# Patient Record
Sex: Male | Born: 1950 | Race: White | Hispanic: No | Marital: Married | Smoking: Never smoker
Health system: Southern US, Community
[De-identification: ages and names within clinical notes are randomized; demographics above are authoritative.]

## PROBLEM LIST (undated history)

## (undated) DIAGNOSIS — K219 Gastro-esophageal reflux disease without esophagitis: Secondary | ICD-10-CM

## (undated) DIAGNOSIS — K449 Diaphragmatic hernia without obstruction or gangrene: Secondary | ICD-10-CM

## (undated) DIAGNOSIS — T7840XA Allergy, unspecified, initial encounter: Secondary | ICD-10-CM

## (undated) DIAGNOSIS — R42 Dizziness and giddiness: Secondary | ICD-10-CM

## (undated) DIAGNOSIS — R011 Cardiac murmur, unspecified: Secondary | ICD-10-CM

## (undated) DIAGNOSIS — I10 Essential (primary) hypertension: Secondary | ICD-10-CM

## (undated) DIAGNOSIS — M1990 Unspecified osteoarthritis, unspecified site: Secondary | ICD-10-CM

## (undated) DIAGNOSIS — G4730 Sleep apnea, unspecified: Secondary | ICD-10-CM

## (undated) DIAGNOSIS — E785 Hyperlipidemia, unspecified: Secondary | ICD-10-CM

## (undated) HISTORY — DX: Gastro-esophageal reflux disease without esophagitis: K21.9

## (undated) HISTORY — DX: Allergy, unspecified, initial encounter: T78.40XA

## (undated) HISTORY — DX: Unspecified osteoarthritis, unspecified site: M19.90

## (undated) HISTORY — DX: Cardiac murmur, unspecified: R01.1

## (undated) HISTORY — DX: Sleep apnea, unspecified: G47.30

## (undated) HISTORY — DX: Diaphragmatic hernia without obstruction or gangrene: K44.9

## (undated) HISTORY — DX: Hyperlipidemia, unspecified: E78.5

## (undated) HISTORY — DX: Essential (primary) hypertension: I10

## (undated) HISTORY — DX: Dizziness and giddiness: R42

---

## 2001-03-22 HISTORY — PX: CARDIAC CATHETERIZATION: SHX172

## 2001-04-29 HISTORY — PX: LEG SURGERY: SHX1003

## 2001-08-27 ENCOUNTER — Ambulatory Visit (HOSPITAL_COMMUNITY): Admission: RE | Admit: 2001-08-27 | Discharge: 2001-08-27 | Payer: Self-pay | Admitting: Unknown Physician Specialty

## 2001-08-27 ENCOUNTER — Encounter: Payer: Self-pay | Admitting: Unknown Physician Specialty

## 2014-01-21 DIAGNOSIS — R269 Unspecified abnormalities of gait and mobility: Secondary | ICD-10-CM

## 2014-01-21 DIAGNOSIS — H8110 Benign paroxysmal vertigo, unspecified ear: Secondary | ICD-10-CM

## 2014-01-21 DIAGNOSIS — G44309 Post-traumatic headache, unspecified, not intractable: Secondary | ICD-10-CM

## 2014-01-21 HISTORY — DX: Benign paroxysmal vertigo, unspecified ear: H81.10

## 2014-01-21 HISTORY — DX: Unspecified abnormalities of gait and mobility: R26.9

## 2014-01-21 HISTORY — DX: Post-traumatic headache, unspecified, not intractable: G44.309

## 2017-03-14 DIAGNOSIS — R42 Dizziness and giddiness: Secondary | ICD-10-CM

## 2017-03-14 HISTORY — DX: Dizziness and giddiness: R42

## 2017-05-01 DIAGNOSIS — N3020 Other chronic cystitis without hematuria: Secondary | ICD-10-CM | POA: Diagnosis not present

## 2017-05-01 DIAGNOSIS — R338 Other retention of urine: Secondary | ICD-10-CM | POA: Diagnosis not present

## 2017-05-01 DIAGNOSIS — N401 Enlarged prostate with lower urinary tract symptoms: Secondary | ICD-10-CM | POA: Diagnosis not present

## 2017-05-01 DIAGNOSIS — N312 Flaccid neuropathic bladder, not elsewhere classified: Secondary | ICD-10-CM | POA: Diagnosis not present

## 2017-06-01 DIAGNOSIS — N312 Flaccid neuropathic bladder, not elsewhere classified: Secondary | ICD-10-CM | POA: Diagnosis not present

## 2017-06-01 DIAGNOSIS — N3020 Other chronic cystitis without hematuria: Secondary | ICD-10-CM | POA: Diagnosis not present

## 2017-06-01 DIAGNOSIS — N401 Enlarged prostate with lower urinary tract symptoms: Secondary | ICD-10-CM | POA: Diagnosis not present

## 2017-07-13 ENCOUNTER — Encounter: Payer: Self-pay | Admitting: *Deleted

## 2017-07-13 ENCOUNTER — Other Ambulatory Visit: Payer: Self-pay | Admitting: *Deleted

## 2017-07-13 NOTE — Patient Outreach (Signed)
HTA Screen: Pt sees primary care MD routinely Wilburn Mylar at [facility]). Takes medications as ordered for chronic problems. No acute health concerns. No care management needs. Introductory letter sent.  Eulah Pont. Myrtie Neither, MSN, [facility] Gerontological Nurse Practitioner [facility] Care Management [phone]

## 2017-07-27 DIAGNOSIS — G4733 Obstructive sleep apnea (adult) (pediatric): Secondary | ICD-10-CM | POA: Diagnosis not present

## 2017-07-27 DIAGNOSIS — R5383 Other fatigue: Secondary | ICD-10-CM | POA: Diagnosis not present

## 2017-08-28 DIAGNOSIS — E663 Overweight: Secondary | ICD-10-CM | POA: Diagnosis not present

## 2017-08-28 DIAGNOSIS — Z23 Encounter for immunization: Secondary | ICD-10-CM | POA: Diagnosis not present

## 2017-08-28 DIAGNOSIS — Z6835 Body mass index (BMI) 35.0-35.9, adult: Secondary | ICD-10-CM | POA: Diagnosis not present

## 2017-08-28 DIAGNOSIS — I10 Essential (primary) hypertension: Secondary | ICD-10-CM | POA: Diagnosis not present

## 2017-08-28 DIAGNOSIS — Z0000 Encounter for general adult medical examination without abnormal findings: Secondary | ICD-10-CM | POA: Diagnosis not present

## 2017-08-28 DIAGNOSIS — Z1339 Encounter for screening examination for other mental health and behavioral disorders: Secondary | ICD-10-CM | POA: Diagnosis not present

## 2017-08-28 DIAGNOSIS — Z1331 Encounter for screening for depression: Secondary | ICD-10-CM | POA: Diagnosis not present

## 2017-08-28 DIAGNOSIS — E119 Type 2 diabetes mellitus without complications: Secondary | ICD-10-CM | POA: Diagnosis not present

## 2017-08-28 DIAGNOSIS — G4730 Sleep apnea, unspecified: Secondary | ICD-10-CM | POA: Diagnosis not present

## 2017-08-28 DIAGNOSIS — Z9181 History of falling: Secondary | ICD-10-CM | POA: Diagnosis not present

## 2017-08-28 DIAGNOSIS — Z79899 Other long term (current) drug therapy: Secondary | ICD-10-CM | POA: Diagnosis not present

## 2017-08-28 DIAGNOSIS — E785 Hyperlipidemia, unspecified: Secondary | ICD-10-CM | POA: Diagnosis not present

## 2017-08-31 DIAGNOSIS — H35369 Drusen (degenerative) of macula, unspecified eye: Secondary | ICD-10-CM | POA: Diagnosis not present

## 2017-08-31 DIAGNOSIS — Z961 Presence of intraocular lens: Secondary | ICD-10-CM | POA: Diagnosis not present

## 2017-08-31 DIAGNOSIS — H11153 Pinguecula, bilateral: Secondary | ICD-10-CM | POA: Diagnosis not present

## 2017-09-03 DIAGNOSIS — G4733 Obstructive sleep apnea (adult) (pediatric): Secondary | ICD-10-CM | POA: Diagnosis not present

## 2017-09-04 DIAGNOSIS — N3020 Other chronic cystitis without hematuria: Secondary | ICD-10-CM | POA: Diagnosis not present

## 2017-09-04 DIAGNOSIS — N312 Flaccid neuropathic bladder, not elsewhere classified: Secondary | ICD-10-CM | POA: Diagnosis not present

## 2017-09-04 DIAGNOSIS — N401 Enlarged prostate with lower urinary tract symptoms: Secondary | ICD-10-CM | POA: Diagnosis not present

## 2017-09-13 DIAGNOSIS — J321 Chronic frontal sinusitis: Secondary | ICD-10-CM | POA: Diagnosis not present

## 2017-09-13 DIAGNOSIS — Z6836 Body mass index (BMI) 36.0-36.9, adult: Secondary | ICD-10-CM | POA: Diagnosis not present

## 2017-09-19 DIAGNOSIS — R5383 Other fatigue: Secondary | ICD-10-CM | POA: Diagnosis not present

## 2017-09-19 DIAGNOSIS — G4733 Obstructive sleep apnea (adult) (pediatric): Secondary | ICD-10-CM | POA: Diagnosis not present

## 2017-10-10 DIAGNOSIS — G4733 Obstructive sleep apnea (adult) (pediatric): Secondary | ICD-10-CM | POA: Diagnosis not present

## 2017-10-16 DIAGNOSIS — Z6837 Body mass index (BMI) 37.0-37.9, adult: Secondary | ICD-10-CM | POA: Diagnosis not present

## 2017-10-16 DIAGNOSIS — J40 Bronchitis, not specified as acute or chronic: Secondary | ICD-10-CM | POA: Diagnosis not present

## 2017-11-09 DIAGNOSIS — G4733 Obstructive sleep apnea (adult) (pediatric): Secondary | ICD-10-CM | POA: Diagnosis not present

## 2017-11-10 DIAGNOSIS — G4733 Obstructive sleep apnea (adult) (pediatric): Secondary | ICD-10-CM | POA: Diagnosis not present

## 2017-11-28 DIAGNOSIS — R05 Cough: Secondary | ICD-10-CM | POA: Diagnosis not present

## 2017-11-28 DIAGNOSIS — G4733 Obstructive sleep apnea (adult) (pediatric): Secondary | ICD-10-CM | POA: Diagnosis not present

## 2017-11-28 DIAGNOSIS — R5383 Other fatigue: Secondary | ICD-10-CM | POA: Diagnosis not present

## 2017-12-11 DIAGNOSIS — G4733 Obstructive sleep apnea (adult) (pediatric): Secondary | ICD-10-CM | POA: Diagnosis not present

## 2018-01-02 DIAGNOSIS — T17208A Unspecified foreign body in pharynx causing other injury, initial encounter: Secondary | ICD-10-CM | POA: Diagnosis not present

## 2018-01-07 DIAGNOSIS — R590 Localized enlarged lymph nodes: Secondary | ICD-10-CM | POA: Diagnosis not present

## 2018-01-07 DIAGNOSIS — J479 Bronchiectasis, uncomplicated: Secondary | ICD-10-CM | POA: Diagnosis not present

## 2018-01-07 DIAGNOSIS — J849 Interstitial pulmonary disease, unspecified: Secondary | ICD-10-CM | POA: Diagnosis not present

## 2018-01-10 DIAGNOSIS — R05 Cough: Secondary | ICD-10-CM | POA: Diagnosis not present

## 2018-01-10 DIAGNOSIS — J84112 Idiopathic pulmonary fibrosis: Secondary | ICD-10-CM | POA: Diagnosis not present

## 2018-01-10 DIAGNOSIS — R591 Generalized enlarged lymph nodes: Secondary | ICD-10-CM | POA: Diagnosis not present

## 2018-01-10 DIAGNOSIS — M539 Dorsopathy, unspecified: Secondary | ICD-10-CM | POA: Diagnosis not present

## 2018-01-10 DIAGNOSIS — T17208A Unspecified foreign body in pharynx causing other injury, initial encounter: Secondary | ICD-10-CM | POA: Diagnosis not present

## 2018-01-10 DIAGNOSIS — G4733 Obstructive sleep apnea (adult) (pediatric): Secondary | ICD-10-CM | POA: Diagnosis not present

## 2018-01-10 DIAGNOSIS — I7090 Unspecified atherosclerosis: Secondary | ICD-10-CM | POA: Diagnosis not present

## 2018-01-10 DIAGNOSIS — J849 Interstitial pulmonary disease, unspecified: Secondary | ICD-10-CM | POA: Diagnosis not present

## 2018-01-21 DIAGNOSIS — J8410 Pulmonary fibrosis, unspecified: Secondary | ICD-10-CM | POA: Diagnosis not present

## 2018-01-21 DIAGNOSIS — I700 Atherosclerosis of aorta: Secondary | ICD-10-CM | POA: Diagnosis not present

## 2018-01-21 DIAGNOSIS — J84112 Idiopathic pulmonary fibrosis: Secondary | ICD-10-CM | POA: Diagnosis not present

## 2018-01-25 DIAGNOSIS — R05 Cough: Secondary | ICD-10-CM | POA: Diagnosis not present

## 2018-01-25 DIAGNOSIS — J84112 Idiopathic pulmonary fibrosis: Secondary | ICD-10-CM | POA: Diagnosis not present

## 2018-01-25 DIAGNOSIS — J479 Bronchiectasis, uncomplicated: Secondary | ICD-10-CM | POA: Diagnosis not present

## 2018-01-25 DIAGNOSIS — G4733 Obstructive sleep apnea (adult) (pediatric): Secondary | ICD-10-CM | POA: Diagnosis not present

## 2018-02-01 DIAGNOSIS — R5383 Other fatigue: Secondary | ICD-10-CM | POA: Diagnosis not present

## 2018-02-01 DIAGNOSIS — J84112 Idiopathic pulmonary fibrosis: Secondary | ICD-10-CM | POA: Diagnosis not present

## 2018-02-01 DIAGNOSIS — R05 Cough: Secondary | ICD-10-CM | POA: Diagnosis not present

## 2018-02-01 DIAGNOSIS — G4733 Obstructive sleep apnea (adult) (pediatric): Secondary | ICD-10-CM | POA: Diagnosis not present

## 2018-02-04 DIAGNOSIS — E7800 Pure hypercholesterolemia, unspecified: Secondary | ICD-10-CM | POA: Diagnosis not present

## 2018-02-04 DIAGNOSIS — I10 Essential (primary) hypertension: Secondary | ICD-10-CM | POA: Diagnosis not present

## 2018-02-04 DIAGNOSIS — I2510 Atherosclerotic heart disease of native coronary artery without angina pectoris: Secondary | ICD-10-CM | POA: Diagnosis not present

## 2018-02-04 DIAGNOSIS — Z6836 Body mass index (BMI) 36.0-36.9, adult: Secondary | ICD-10-CM | POA: Diagnosis not present

## 2018-02-04 DIAGNOSIS — J849 Interstitial pulmonary disease, unspecified: Secondary | ICD-10-CM | POA: Diagnosis not present

## 2018-02-04 DIAGNOSIS — E119 Type 2 diabetes mellitus without complications: Secondary | ICD-10-CM | POA: Diagnosis not present

## 2018-02-05 ENCOUNTER — Encounter: Payer: Self-pay | Admitting: Cardiology

## 2018-02-05 DIAGNOSIS — I10 Essential (primary) hypertension: Secondary | ICD-10-CM | POA: Insufficient documentation

## 2018-02-05 DIAGNOSIS — G4730 Sleep apnea, unspecified: Secondary | ICD-10-CM | POA: Insufficient documentation

## 2018-02-05 DIAGNOSIS — E7800 Pure hypercholesterolemia, unspecified: Secondary | ICD-10-CM | POA: Insufficient documentation

## 2018-02-05 DIAGNOSIS — E669 Obesity, unspecified: Secondary | ICD-10-CM | POA: Insufficient documentation

## 2018-02-05 HISTORY — DX: Pure hypercholesterolemia, unspecified: E78.00

## 2018-02-05 HISTORY — DX: Obesity, unspecified: E66.9

## 2018-02-06 ENCOUNTER — Encounter: Payer: Self-pay | Admitting: Cardiology

## 2018-02-06 ENCOUNTER — Ambulatory Visit (INDEPENDENT_AMBULATORY_CARE_PROVIDER_SITE_OTHER): Payer: PPO | Admitting: Cardiology

## 2018-02-06 VITALS — BP 140/78 | HR 57 | Ht 66.0 in | Wt 239.1 lb

## 2018-02-06 DIAGNOSIS — E7800 Pure hypercholesterolemia, unspecified: Secondary | ICD-10-CM | POA: Diagnosis not present

## 2018-02-06 DIAGNOSIS — E669 Obesity, unspecified: Secondary | ICD-10-CM

## 2018-02-06 DIAGNOSIS — I10 Essential (primary) hypertension: Secondary | ICD-10-CM

## 2018-02-06 DIAGNOSIS — I7090 Unspecified atherosclerosis: Secondary | ICD-10-CM

## 2018-02-06 DIAGNOSIS — G4730 Sleep apnea, unspecified: Secondary | ICD-10-CM

## 2018-02-06 HISTORY — DX: Unspecified atherosclerosis: I70.90

## 2018-02-06 NOTE — Patient Instructions (Signed)
Medication Instructions:  Your physician recommends that you continue on your current medications as directed. Please refer to the Current Medication list given to you today.  Labwork: None  Testing/Procedures: Your physician has requested that you have a lexiscan myoview. For further information please visit [email or web address]. Please follow instruction sheet, as given.   Follow-Up: Your physician recommends that you schedule a follow-up appointment in: 6 months  Any Other Special Instructions Will Be Listed Below (If Applicable).     If you need a refill on your cardiac medications before your next appointment, please call your pharmacy.   [place], RN, BSN

## 2018-02-06 NOTE — Progress Notes (Signed)
Cardiology Office Note:    Date:  02/06/2018   ID:  Stephen [name], DOB October 28, 1951, MRN [id number]  PCP:  Ronita Hipps, MD  Cardiologist:  Jenean Lindau, MD   Referring MD: Ronita Hipps, MD    ASSESSMENT:    1. ASVD (arteriosclerotic vascular disease)   2. Hypertension, unspecified type   3. Hypercholesteremia   4. Obesity, unspecified classification, unspecified obesity type, unspecified whether serious comorbidity present   5. Sleep apnea, unspecified type    PLAN:    In order of problems listed above:  1. Secondary prevention stressed with the patient.  Importance of compliance with diet and medications stressed and he vocalized understanding.  Blood pressure stable.  Diet was discussed with dyslipidemia.  He tells me that he has diet-controlled diabetes mellitus and he is doing well to keep it under control.  Weight reduction was stressed.  Risks of obesity explained and he vocalized understanding. 2. I advised the patient about considering a different statin therapy such as atorvastatin or rosuvastatin as these are more of the recommended statins at this time in patients with significant risks and existing coronary artery disease and he tells me that he wants to think about it. 3. Lexiscan sestamibi will be done to assess his coronary artery disease to see if there is any obstructive coronary artery disease.  The patient is leads a very sedentary lifestyle because of orthopedic issues. 4. Patient will be seen in follow-up appointment in 6 months or earlier if the patient has any concerns 5. Also I will try to see if any medication adjustment that needs to be done for the hyponatremia.  We will try to get in touch with the pharmacy for that evaluation.  He has chronic hyponatremia and this is followed by his primary care physician.   Medication Adjustments/Labs and Tests Ordered: Current medicines are reviewed at length with the patient today.  Concerns regarding medicines are  outlined above.  Orders Placed This Encounter  Procedures  . MYOCARDIAL PERFUSION IMAGING   No orders of the defined types were placed in this encounter.    Chief Complaint  Patient presents with  . Referral    Dr. Helene Kelp  . Coronary Artery Disease     History of Present Illness:    Stephen [name] is a 67 y.o. male the patient has history of essential hypertension and dyslipidemia.  He is overweight.  She has been diagnosed to have hyponatremia.  The patient underwent CT scan and was found to have atherosclerotic vascular disease.  Calcifications were performed in his coronary circulation and therefore he was sent here for an evaluation.  The patient has orthopedic issues and ambulates with a cane.  He denies any chest pain orthopnea or PND.  He has been on statin therapy and on aspirin appropriately for a long period of time.  At the time of my evaluation, the patient is alert awake oriented and in no distress.  His wife accompanies him for this visit.  Past Medical History:  Diagnosis Date  . Allergy   . Arthritis   . GERD (gastroesophageal reflux disease)   . Heart murmur   . Hiatal hernia   . Hyperlipidemia   . Hypertension   . Sleep apnea   . Vertigo     Past Surgical History:  Procedure Laterality Date  . CARDIAC CATHETERIZATION  03/22/2001  . LEG SURGERY Bilateral 04/2001    Current Medications: Current Meds  Medication Sig  .  acetylcysteine (MUCOMYST) 10 % nebulizer solution   . amitriptyline (ELAVIL) 75 MG tablet Take 75 mg by mouth at bedtime.  Marland Kitchen amLODipine (NORVASC) 10 MG tablet Take 10 mg by mouth daily.  Marland Kitchen aspirin EC 81 MG tablet Take 81 mg by mouth daily.  Marland Kitchen azithromycin (ZITHROMAX) 500 MG tablet Take one tablet by mouth daily for 10 days  . benazepril (LOTENSIN) 40 MG tablet Take 40 mg by mouth daily.  . calcium carbonate (OSCAL) 1500 (600 Ca) MG TABS tablet Take by mouth 2 (two) times daily with a meal.  . calcium carbonate (TUMS - DOSED IN MG ELEMENTAL  CALCIUM) 500 MG chewable tablet Chew 1 tablet by mouth daily.  . carvedilol (COREG) 25 MG tablet Take 25 mg by mouth 2 (two) times daily with a meal.  . cholecalciferol (VITAMIN D) 1000 units tablet Take 1,000 Units by mouth daily.  . cloNIDine (CATAPRES) 0.3 MG tablet Take 0.3 mg by mouth 2 (two) times daily.  Marland Kitchen dicyclomine (BENTYL) 10 MG capsule Take 10 mg by mouth 4 (four) times daily -  before meals and at bedtime.  . fenofibrate micronized (LOFIBRA) 134 MG capsule Take 134 mg by mouth every other day.  . fluticasone (FLONASE) 50 MCG/ACT nasal spray instill 1 spray in each nostril TWICE DAILY  . ipratropium-albuterol (DUONEB) 0.5-2.5 (3) MG/3ML SOLN Inhale 3 mLs into the lungs as needed.  . lansoprazole (PREVACID) 30 MG capsule Take 30 mg by mouth daily at 12 noon.  Marland Kitchen levofloxacin (LEVAQUIN) 500 MG tablet Take 500 mg by mouth daily.  Marland Kitchen lovastatin (MEVACOR) 20 MG tablet Take 20 mg by mouth every other day.  . montelukast (SINGULAIR) 10 MG tablet Take 10 mg by mouth at bedtime.  . Multiple Vitamin (MULTIVITAMIN WITH MINERALS) TABS tablet Take 1 tablet by mouth daily.  . nabumetone (RELAFEN) 750 MG tablet Take 750 mg by mouth 2 (two) times daily.  . polyethylene glycol (MIRALAX / GLYCOLAX) packet Take 17 g by mouth daily.  Marland Kitchen spironolactone (ALDACTONE) 25 MG tablet Take 25 mg by mouth 3 (three) times a week.  . vitamin E 400 UNIT capsule Take 400 Units by mouth daily.     Allergies:   Ciprofloxacin and Codeine   Social History   Socioeconomic History  . Marital status: Married    Spouse name: Not on file  . Number of children: Not on file  . Years of education: Not on file  . Highest education level: Not on file  Occupational History  . Not on file  Social Needs  . Financial resource strain: Not on file  . Food insecurity:    Worry: Not on file    Inability: Not on file  . Transportation needs:    Medical: Not on file    Non-medical: Not on file  Tobacco Use  . Smoking status:  Never Smoker  . Smokeless tobacco: Never Used  Substance and Sexual Activity  . Alcohol use: Not on file  . Drug use: Not on file  . Sexual activity: Not on file  Lifestyle  . Physical activity:    Days per week: Not on file    Minutes per session: Not on file  . Stress: Not on file  Relationships  . Social connections:    Talks on phone: Not on file    Gets together: Not on file    Attends religious service: Not on file    Active member of club or organization: Not on file  Attends meetings of clubs or organizations: Not on file    Relationship status: Not on file  Other Topics Concern  . Not on file  Social History Narrative  . Not on file     Family History: The patient's family history includes Aneurysm in his father and maternal grandmother; Cancer in his maternal grandmother; Dementia in his mother; Heart attack in his father.  ROS:   Please see the history of present illness.    All other systems reviewed and are negative.  EKGs/Labs/Other Studies Reviewed:    The following studies were reviewed today: EKG reveals sinus rhythm and nonspecific ST-T changes.   Recent Labs: No results found for requested labs within last 8760 hours.  Recent Lipid Panel No results found for: CHOL, TRIG, HDL, CHOLHDL, VLDL, LDLCALC, LDLDIRECT  Physical Exam:    VS:  BP 140/78 (BP Location: Right Arm, Patient Position: Sitting, Cuff Size: Normal)   Pulse (!) 57   Ht _0  (1.676 m)   Wt 239 lb 1.9 oz (108.5 kg)   SpO2 98%   BMI 38.59 kg/m     Wt Readings from Last 3 Encounters:  02/06/18 239 lb 1.9 oz (108.5 kg)     GEN: Patient is in no acute distress HEENT: Normal NECK: No JVD; No carotid bruits LYMPHATICS: No lymphadenopathy CARDIAC: Hear sounds regular, 2/6 systolic murmur at the apex. RESPIRATORY:  Clear to auscultation without rales, wheezing or rhonchi  ABDOMEN: Soft, non-tender, non-distended MUSCULOSKELETAL:  No edema; No deformity  SKIN: Warm and  dry NEUROLOGIC:  Alert and oriented x 3 PSYCHIATRIC:  Normal affect   Signed, Jenean Lindau, MD  02/06/2018 11:46 AM    Stephen [name]

## 2018-02-06 NOTE — Progress Notes (Deleted)
ek

## 2018-02-07 ENCOUNTER — Other Ambulatory Visit: Payer: Self-pay

## 2018-02-07 DIAGNOSIS — E871 Hypo-osmolality and hyponatremia: Secondary | ICD-10-CM

## 2018-02-07 MED ORDER — LISINOPRIL 20 MG PO TABS: 20.0000 mg | ORAL_TABLET | Freq: Every day | ORAL | 3 refills | Status: DC

## 2018-02-07 NOTE — Telephone Encounter (Signed)
Benazepril was discontinued as it may be causing the patients chronic hyponatremia; lisinopril 20 mg was added per Dr. Geraldo Pitter. Patient was educated to keep his dietary potassium intake limited as the spironolactone and lisinopril could cause hypokalemia. The patient will have a BMP completed in one week at [facility] in [place]. Wife and patient were agreeable to the plan.

## 2018-02-08 DIAGNOSIS — G4733 Obstructive sleep apnea (adult) (pediatric): Secondary | ICD-10-CM | POA: Diagnosis not present

## 2018-02-15 ENCOUNTER — Encounter: Payer: Self-pay | Admitting: Cardiology

## 2018-02-15 DIAGNOSIS — I10 Essential (primary) hypertension: Secondary | ICD-10-CM | POA: Diagnosis not present

## 2018-02-15 LAB — BASIC METABOLIC PANEL
BUN/Creatinine Ratio: 10 (ref 10–24)
BUN: 9 mg/dL (ref 8–27)
CO2: 24 mmol/L (ref 20–29)
Calcium: 9.7 mg/dL (ref 8.6–10.2)
Chloride: 94 mmol/L — ABNORMAL LOW (ref 96–106)
Creatinine, Ser: 0.9 mg/dL (ref 0.76–1.27)
GFR calc Af Amer: 103 mL/min/{1.73_m2} (ref >59)
GFR calc non Af Amer: 89 mL/min/{1.73_m2} (ref >59)
Glucose: 112 mg/dL — ABNORMAL HIGH (ref 65–99)
Potassium: 4.5 mmol/L (ref 3.5–5.2)
Sodium: 128 mmol/L — ABNORMAL LOW (ref 134–144)

## 2018-02-15 NOTE — Addendum Note (Signed)
Addended by: Tarri Glenn on: 02/15/2018 11:27 AM   Modules accepted: Orders

## 2018-02-18 ENCOUNTER — Telehealth: Payer: Self-pay

## 2018-02-18 ENCOUNTER — Other Ambulatory Visit: Payer: Self-pay

## 2018-02-18 DIAGNOSIS — I10 Essential (primary) hypertension: Secondary | ICD-10-CM

## 2018-02-18 NOTE — Telephone Encounter (Signed)
Left voicemail for the patien to call the office to discuss labwork. PCP reported past sodium of 125.

## 2018-02-18 NOTE — Telephone Encounter (Signed)
Left voicemail to discuss lab results.

## 2018-02-18 NOTE — Telephone Encounter (Signed)
-----  Message from Jenean Lindau, MD sent at 02/18/2018  8:27 AM EDT ----- The results of the study is unremarkable. Please inform patient.  Get prior BMP from physician office.  I will discuss in detail at next appointment. Cc  primary care/referring physician Jenean Lindau, MD 02/18/2018 8:27 AM

## 2018-02-21 ENCOUNTER — Telehealth (HOSPITAL_COMMUNITY): Payer: Self-pay | Admitting: *Deleted

## 2018-02-21 NOTE — Telephone Encounter (Signed)
Patient given detailed instructions per Myocardial Perfusion Study Information Sheet for the test on 02/25/18. Patient notified to arrive 15 minutes early and that it is imperative to arrive on time for appointment to keep from having the test rescheduled.  If you need to cancel or reschedule your appointment, please call the office within 24 hours of your appointment. . Patient verbalized understanding. Kirstie Peri

## 2018-02-25 ENCOUNTER — Ambulatory Visit (HOSPITAL_COMMUNITY): Payer: PPO | Attending: Cardiology

## 2018-02-25 ENCOUNTER — Ambulatory Visit (HOSPITAL_COMMUNITY): Payer: PPO

## 2018-02-25 VITALS — Ht 66.0 in | Wt 239.0 lb

## 2018-02-25 DIAGNOSIS — I7090 Unspecified atherosclerosis: Secondary | ICD-10-CM | POA: Diagnosis not present

## 2018-02-25 DIAGNOSIS — I10 Essential (primary) hypertension: Secondary | ICD-10-CM

## 2018-02-25 LAB — MYOCARDIAL PERFUSION IMAGING
LV dias vol: 130 mL (ref 62–150)
LV sys vol: 42 mL
Peak HR: 74 {beats}/min
RATE: 0.43
Rest HR: 60 {beats}/min
SDS: 3
SRS: 0
SSS: 3
TID: 1.05

## 2018-02-25 MED ORDER — TECHNETIUM TC 99M TETROFOSMIN IV KIT
10.6000 | PACK | Freq: Once | INTRAVENOUS | Status: AC | PRN
  Administered 2018-02-25: 10.6 via INTRAVENOUS
  Filled 2018-02-25: qty 11

## 2018-02-25 MED ORDER — REGADENOSON 0.4 MG/5ML IV SOLN
0.4000 mg | Freq: Once | INTRAVENOUS | Status: AC
  Administered 2018-02-25: 0.4 mg via INTRAVENOUS

## 2018-02-25 MED ORDER — TECHNETIUM TC 99M TETROFOSMIN IV KIT
31.8000 | PACK | Freq: Once | INTRAVENOUS | Status: AC | PRN
  Administered 2018-02-25: 31.8 via INTRAVENOUS
  Filled 2018-02-25: qty 32

## 2018-02-26 ENCOUNTER — Ambulatory Visit (HOSPITAL_COMMUNITY): Payer: PPO

## 2018-03-01 DIAGNOSIS — I10 Essential (primary) hypertension: Secondary | ICD-10-CM | POA: Diagnosis not present

## 2018-03-02 LAB — BASIC METABOLIC PANEL
BUN / CREAT RATIO: 11 (ref 10–24)
BUN: 9 mg/dL (ref 8–27)
CO2: 24 mmol/L (ref 20–29)
Calcium: 9.5 mg/dL (ref 8.6–10.2)
Chloride: 94 mmol/L — ABNORMAL LOW (ref 96–106)
Creatinine, Ser: 0.8 mg/dL (ref 0.76–1.27)
GFR calc non Af Amer: 93 mL/min/{1.73_m2} (ref >59)
GFR, EST AFRICAN AMERICAN: 108 mL/min/{1.73_m2} (ref >59)
Glucose: 93 mg/dL (ref 65–99)
Potassium: 4.6 mmol/L (ref 3.5–5.2)
Sodium: 130 mmol/L — ABNORMAL LOW (ref 134–144)

## 2018-03-05 ENCOUNTER — Encounter: Payer: Self-pay | Admitting: Internal Medicine

## 2018-03-05 ENCOUNTER — Ambulatory Visit: Payer: PPO | Admitting: Internal Medicine

## 2018-03-05 ENCOUNTER — Other Ambulatory Visit (INDEPENDENT_AMBULATORY_CARE_PROVIDER_SITE_OTHER): Payer: PPO

## 2018-03-05 VITALS — BP 118/70 | HR 84 | Ht 66.0 in | Wt 238.4 lb

## 2018-03-05 DIAGNOSIS — J849 Interstitial pulmonary disease, unspecified: Secondary | ICD-10-CM | POA: Diagnosis not present

## 2018-03-05 LAB — SEDIMENTATION RATE: Sed Rate: 21 mm/hr — ABNORMAL HIGH (ref 0–20)

## 2018-03-05 NOTE — Patient Instructions (Signed)
ICD-10-CM   1. ILD (interstitial lung disease) ([place]) J84.9      - You  have Interstitial Lung Disease (ILD)  -  There are > 100 varieties of this - To narrow down possibilities and assess severity please do the following tests  - do spirometry and DLCO (choose a location depending on schedule and convenience)   - if not get reslts from Dr Alcide Clever office  - - do autoimmune panel: Serum: ESR, ANA, DS-DNA, RF, anti-CCP, ssA, ssB, scl-70, ANCA, MPO and PR-3 antibodies, Total CK,  Aldolase,  Hypersensitivity Pneumonitis Panel - Fill our ILD questionnaire 03/05/2018 - will get a 2nd opinion on  Your CT chest  Followup 2-4 weeks ILD clinic to regroup  - depending on results biiopsy discussion

## 2018-03-05 NOTE — Progress Notes (Signed)
Subjective:    Patient ID: Stephen [name], male    DOB: 1950/12/20, 67 y.o.   MRN: [id number]  PCP Ronita Hipps, MD  HPI   IOV 03/05/2018  Chief Complaint  Patient presents with  . Consult    Referral per MD Helene Kelp, Dec 2018 had sinus infection and still has cough, hx of mild bronchiectasis, later was given symbicort which patient believes is making him more SOB.      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  He lives in [facility] and presents with his wife for evaluation of pulmonary fibrosis to the ILD clinic.  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  During this time he was not exposed to any metal dust but he was exposed to mold and damp environments intermittently but not always.  He denies any mold or mildew in the house.  He tells me that in December 2018 he had a sinus infection and since then has a lingering cough that never really went away.  It looks like from the primary care physician he saw dentist and from there he had imaging and this finally all resulted in a high-resolution CT chest January 21, 2018 that I personally visualized.  The official report is that it is indeterminate for UIP.  In my personal visualization there is bilateral bibasal symmetric disease with definite of craniocaudal gradient.  The septal thickening and also cylindrical bronchiectasis and peripheral bronchiec bronchiolectasis.  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  He does not have much of her shortness of breath but then he is morbidly obese and has balance issues and is quite limited.  He was seen by a local pulmonologist will give him a 2-5-year survival rate and therefore he is frustrated.  He was started on Symbicort which actually made things worse for him.  [place] pulmonary interstitial lung disease  questionnaire  Symptoms: Since dec 2019. Some sputjm +. Better since started. Insidious dyspnea x 4 months. Same since onset. No episodic dyspnea. lLeve 4 dyspnea walking wiuth peers and level 5 walking up hill/stairs. Level 1 for dressing, Level 2 fo rshopping and mpwing lawn  ROS: chronic balance issues affecting mobiluity.  In association with this he has fatigue for the last 3 years.  He has dry mouth for the last several years associated with some dysphagia for the last 10 years heartburn and acid reflux for several years.  Daytime set sleepiness for the last several years but denies any oral ulcers or rash or weight loss or recurrent fever or arthralgia  Past medical history: Denies any asthma COPD or heart failure rheumatoid arthritis or scleroderma or systemic lupus erythematosus or polymyositis or Sjogren's but is positive for sleep apnea and hiatal hernia.  Negative for pulmonary hypertension diabetes thyroid disease stroke seizures hepatitis tuberculosis kidney disease blood clots heart disease other than murmur pleurisy  Family history of pulmonary disease: Positive for asthma in the mother but nobody with pulmonary fibrosis  Personal exposure history: He has never smoked cigarettes did smoke cigars for 3 years.  [place]  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  During this time there is intermittent mold exposure  Pulmonary toxicity history denies a laundry list of pulmonary toxicity medications   Simple office walk 185 feet x  3 laps goal with forehead probe 03/05/2018   O2 used Room air  Number laps completed All 3 laops  Comments about pace Walks very slow due to baseline balance issue "40% of  balance only due to remote trauma  Resting Pulse Ox/HR 100% and 55/min  Final Pulse Ox/HR 97% and 78/min  Desaturated </= 88% no  Desaturated <= 3% points yes  Got Tachycardic >/= 90/min no  Symptoms at end of test No, denied dyspnea  Miscellaneous comments npne        has a past medical history of Allergy, Arthritis, GERD (gastroesophageal reflux disease), Heart murmur, Hiatal hernia, Hyperlipidemia, Hypertension, Sleep apnea, and Vertigo.   reports that he has never smoked. He has never used smokeless tobacco.  Past Surgical History:  Procedure Laterality Date  . CARDIAC CATHETERIZATION  03/22/2001  . LEG SURGERY Bilateral 04/2001    Allergies  Allergen Reactions  . Ciprofloxacin Other (See Comments)    colitis  . Codeine     Immunization History  Administered Date(s) Administered  . Influenza, High Dose Seasonal PF 08/28/2017  . Pneumococcal Conjugate-13 08/25/2016  . Pneumococcal Polysaccharide-23 11/18/2007    Family History  Problem Relation Age of Onset  . Dementia Mother   . Heart attack Father   . Aneurysm Father   . Cancer Maternal Grandmother   . Aneurysm Maternal Grandmother      Current Outpatient Medications:  .  amitriptyline (ELAVIL) 75 MG tablet, Take 75 mg by mouth at bedtime., Disp: , Rfl:  .  amLODipine (NORVASC) 10 MG tablet, Take 10 mg by mouth daily., Disp: , Rfl:  .  aspirin EC 81 MG tablet, Take 81 mg by mouth daily., Disp: , Rfl:  .  calcium carbonate (OSCAL) 1500 (600 Ca) MG TABS tablet, Take by mouth 2 (two) times daily with a meal., Disp: , Rfl:  .  calcium carbonate (TUMS - DOSED IN MG ELEMENTAL CALCIUM) 500 MG chewable tablet, Chew 1 tablet by mouth daily., Disp: , Rfl:  .  carvedilol (COREG) 25 MG tablet, Take 25 mg by mouth 2 (two) times daily with a meal., Disp: , Rfl:  .  cholecalciferol (VITAMIN D) 1000 units tablet, Take 1,000 Units by mouth daily., Disp: , Rfl:  .  cloNIDine (CATAPRES) 0.3 MG tablet, Take 0.3 mg by mouth 2  (two) times daily., Disp: , Rfl:  .  dicyclomine (BENTYL) 10 MG capsule, Take 10 mg by mouth 4 (four) times daily -  before meals and at bedtime., Disp: , Rfl:  .  fenofibrate micronized (LOFIBRA) 134 MG capsule, Take 134 mg by mouth every other day., Disp: , Rfl:  .  lansoprazole (PREVACID) 30 MG capsule, Take 30 mg by mouth daily at 12 noon., Disp: , Rfl:  .  lisinopril (PRINIVIL,ZESTRIL) 20 MG tablet, Take 1 tablet (20 mg total) by mouth daily., Disp: 30 tablet, Rfl: 3 .  lovastatin (MEVACOR) 20 MG tablet, Take 20 mg by mouth every other day., Disp: , Rfl:  .  montelukast (SINGULAIR) 10 MG tablet, Take 10 mg by mouth at bedtime., Disp: , Rfl:  .  Multiple Vitamin (MULTIVITAMIN WITH MINERALS) TABS tablet, Take 1 tablet by mouth daily., Disp: , Rfl:  .  nabumetone (RELAFEN) 750 MG tablet, Take 750 mg by mouth 2 (two) times daily., Disp: , Rfl:  .  polyethylene glycol (MIRALAX / GLYCOLAX) packet, Take 17 g by mouth daily., Disp: , Rfl:  .  spironolactone (ALDACTONE) 25 MG tablet, Take 25 mg by mouth 3 (three) times a week., Disp: , Rfl:  .  vitamin E 400 UNIT capsule, Take 400 Units by mouth daily., Disp: , Rfl:  .  acetylcysteine (MUCOMYST) 10 % nebulizer solution, , Disp: , Rfl:  .  azithromycin (ZITHROMAX) 500 MG tablet, Take one tablet by mouth daily for 10 days, Disp: , Rfl: 0 .  fluticasone (FLONASE) 50 MCG/ACT nasal spray, instill 1 spray in each nostril TWICE DAILY, Disp: , Rfl: 3 .  ipratropium-albuterol (DUONEB) 0.5-2.5 (3) MG/3ML SOLN, Inhale 3 mLs into the lungs as needed., Disp: , Rfl:  .  levofloxacin (LEVAQUIN) 500 MG tablet, Take 500 mg by mouth daily., Disp: , Rfl:     Review of Systems  Constitutional: Negative for fever and unexpected weight change.  HENT: Positive for sinus pressure. Negative for congestion, dental problem, ear pain, nosebleeds, postnasal drip, rhinorrhea, sneezing, sore throat and trouble swallowing.   Eyes: Negative for redness and itching.  Respiratory:  Positive for shortness of breath. Negative for cough, chest tightness and wheezing.   Cardiovascular: Negative for palpitations and leg swelling.  Gastrointestinal: Negative for nausea and vomiting.  Genitourinary: Negative for dysuria.  Musculoskeletal: Negative for joint swelling.  Skin: Negative for rash.  Allergic/Immunologic: Positive for environmental allergies. Negative for food allergies and immunocompromised state.  Neurological: Negative for headaches.  Hematological: Does not bruise/bleed easily.  Psychiatric/Behavioral: Negative for dysphoric mood. The patient is not nervous/anxious.        Objective:   Physical Exam  Vitals:   03/05/18 1349  BP: 118/70  Pulse: 84  SpO2: 95%  Weight: 238 lb 6.4 oz (108.1 kg)  Height: _0  (1.676 m)    Estimated body mass index is 38.48 kg/m as calculated from the following:   Height as of this encounter: _1  (1.676 m).   Weight as of this encounter: 238 lb 6.4 oz (108.1 kg).   General Appearance:    LooksOBESE - +  Head:    Normocephalic, without obvious abnormality, atraumatic  Eyes:    PERRL - yes, conjunctiva/corneas - clear      Ears:    Normal external ear canals, both ears  Nose:   NG tube - no  Throat:  ETT TUBE - no , OG tube - no  Neck:   Supple,  No enlargement/tenderness/nodules     Lungs:     Bilateral LL velcro crackles +  Chest wall:    No deformity  Heart:    S1 and S2 normal, no murmur, CVP - no.  Pressors - no  Abdomen:     Soft, no masses, no organomegaly  Genitalia:    Not done  Rectal:   not done  Extremities:   Extremities- no clubbing     Skin:   Intact in exposed areas .      Neurologic:   Sedation - none -> RASS - na . Moves all 4s - yes. CAM-ICU - neg . Orientation - x3+          Assessment & Plan:     ICD-10-CM   1. ILD (interstitial lung disease) ([place]) J84.9    The CT scan is a the probable UIP or indeterminate UIP.  Given male  gender and age greater than 33 the likelihood of IPF  is high provided the autoimmune test results are negative.  Alternatively given the history of climbing and working in [employer] and crawl spaces there is a possibility for chronic hypersensitivity pneumonitis the second most common differential diagnosis in the situation.  Plan     - You  have Interstitial Lung Disease (ILD)  -  There are > 100 varieties of this - To narrow down possibilities and assess severity please do the following tests  - do spirometry and DLCO (choose a location depending on schedule and convenience)   - if not get reslts from Dr Alcide Clever office  - - do autoimmune panel: Serum: ESR, ANA, DS-DNA, RF, anti-CCP, ssA, ssB, scl-70, ANCA, MPO and PR-3 antibodies, Total CK,  Aldolase,  Hypersensitivity Pneumonitis Panel - will get a 2nd opinion on  Your CT chest from Dr Clayborne Dana  Followup 2-4 weeks ILD clinic to regroup  - depending on results biiopsy discussion; if the above are inconclusive might first resort to bronchoscopy with bronchoalveolar lavage and if there is lymphocytic predominance we will go with chronic HP but if there is neutrophilic predominance ago with IPF.  If these are inconclusive might have to recommend surgical lung biopsy    Dr. Brand Males, M.D., [facility].C.P Pulmonary and Critical Care Medicine Staff Physician, [place] Director - Interstitial Lung Disease  Program  Pulmonary [place] at [place], [place], [address]  Pager: [phone], If no answer or between  15:00h - 7:00h: call [phone] Telephone: [phone]

## 2018-03-06 ENCOUNTER — Encounter: Payer: Self-pay | Admitting: Internal Medicine

## 2018-03-10 DIAGNOSIS — G4733 Obstructive sleep apnea (adult) (pediatric): Secondary | ICD-10-CM | POA: Diagnosis not present

## 2018-03-10 LAB — HYPERSENSITIVITY PNUEMONITIS PROFILE
ASPERGILLUS FUMIGATUS: NEGATIVE
FAENIA RETIVIRGULA: NEGATIVE
PIGEON SERUM: NEGATIVE
S. VIRIDIS: NEGATIVE
T. CANDIDUS: NEGATIVE
T. VULGARIS: NEGATIVE

## 2018-03-10 LAB — MPO/PR-3 (ANCA) ANTIBODIES
Myeloperoxidase Abs: <1 AI
Serine Protease 3: <1 AI

## 2018-03-10 LAB — CK TOTAL AND CKMB (NOT AT ARMC)
CK, MB: 2.3 ng/mL (ref 0–5.0)
RELATIVE INDEX: 2.2 (ref 0–4.0)
Total CK: 105 U/L (ref 44–196)

## 2018-03-10 LAB — ANCA SCREEN W REFLEX TITER
ANCA SCREEN: POSITIVE — AB
Atypical P-ANCA titer: 1:40 {titer} — ABNORMAL HIGH

## 2018-03-10 LAB — RHEUMATOID FACTOR: Rhuematoid fact SerPl-aCnc: <14 IU/mL (ref <14)

## 2018-03-10 LAB — ANTI-DNA ANTIBODY, DOUBLE-STRANDED: ds DNA Ab: <1 IU/mL

## 2018-03-10 LAB — ANTI-SCLERODERMA ANTIBODY: SCLERODERMA (SCL-70) (ENA) ANTIBODY, IGG: NEGATIVE AI

## 2018-03-10 LAB — ANA: Anti Nuclear Antibody(ANA): NEGATIVE

## 2018-03-10 LAB — ALDOLASE: ALDOLASE: 5.8 U/L (ref <=8.1)

## 2018-03-10 LAB — CYCLIC CITRUL PEPTIDE ANTIBODY, IGG: CYCLIC CITRULLIN PEPTIDE AB: 48 U — AB

## 2018-03-12 DIAGNOSIS — N3020 Other chronic cystitis without hematuria: Secondary | ICD-10-CM | POA: Diagnosis not present

## 2018-03-12 DIAGNOSIS — N400 Enlarged prostate without lower urinary tract symptoms: Secondary | ICD-10-CM | POA: Diagnosis not present

## 2018-03-12 DIAGNOSIS — N318 Other neuromuscular dysfunction of bladder: Secondary | ICD-10-CM | POA: Diagnosis not present

## 2018-03-12 DIAGNOSIS — N401 Enlarged prostate with lower urinary tract symptoms: Secondary | ICD-10-CM | POA: Diagnosis not present

## 2018-03-19 ENCOUNTER — Telehealth: Payer: Self-pay | Admitting: Internal Medicine

## 2018-03-19 DIAGNOSIS — R269 Unspecified abnormalities of gait and mobility: Secondary | ICD-10-CM | POA: Diagnosis not present

## 2018-03-19 DIAGNOSIS — Z6836 Body mass index (BMI) 36.0-36.9, adult: Secondary | ICD-10-CM | POA: Diagnosis not present

## 2018-03-19 DIAGNOSIS — F0781 Postconcussional syndrome: Secondary | ICD-10-CM | POA: Diagnosis not present

## 2018-03-19 DIAGNOSIS — M25371 Other instability, right ankle: Secondary | ICD-10-CM | POA: Diagnosis not present

## 2018-03-19 NOTE — Telephone Encounter (Signed)
Dr Rosario Jacks radiologist felt CT is probable UIP  autpimmune is trace positive for CCP - suggesting he might have Rheumatoid  PLAN - keep appt with me 04/11/18 - in interim refer to any rheyumatologist in town    Results for CINQUE, BEGLEY (MRN [id number]) as of 03/19/2018 17:35  Ref. Range 03/05/2018 15:48  CK Total Latest Ref Range: 44 - 196 U/L 105  CK, MB Latest Ref Range: 0 - 5.0 ng/mL 2.3  Aldolase Latest Ref Range: < OR = 8.1 U/L 5.8  Sed Rate Latest Ref Range: 0 - 20 mm/hr 21 (H)  ASPERGILLUS FUMIGATUS Latest Ref Range: NEGATIVE  NEGATIVE  [place]on Serum Latest Ref Range: NEGATIVE  NEGATIVE  Anit Nuclear Antibody(ANA) Latest Ref Range: NEGATIVE  NEGATIVE  Cyclic Citrullin Peptide Ab Latest Units: UNITS 48 (H)  ds DNA Ab Latest Units: IU/mL <1  Myeloperoxidase Abs Latest Units: AI <1.0  Serine Protease 3 Latest Units: AI <1.0  RA Latex Turbid. Latest Ref Range: <14 IU/mL <14  Atypical P-ANCA titer Latest Ref Range: <1:20 titer 1:40 (H)  Scleroderma (Scl-70) (ENA) Antibody, IgG Latest Ref Range: <1.0 NEG AI <1.0 NEG  Faenia retivirgula Latest Ref Range: NEGATIVE  NEGATIVE  S. VIRIDIS Latest Ref Range: NEGATIVE  NEGATIVE  T. CANDIDUS Latest Ref Range: NEGATIVE  NEGATIVE  T. VULGARIS Latest Ref Range: NEGATIVE  NEGATIVE

## 2018-03-27 NOTE — Telephone Encounter (Signed)
Spoke with pt, aware of recs.  Pt would like to postpone rheumatology referral until after 6/13 OV so this can be discussed further.  Referral not placed.  Will close encounter.

## 2018-04-10 DIAGNOSIS — G4733 Obstructive sleep apnea (adult) (pediatric): Secondary | ICD-10-CM | POA: Diagnosis not present

## 2018-04-11 ENCOUNTER — Ambulatory Visit (INDEPENDENT_AMBULATORY_CARE_PROVIDER_SITE_OTHER): Payer: PPO | Admitting: Internal Medicine

## 2018-04-11 ENCOUNTER — Encounter: Payer: Self-pay | Admitting: Internal Medicine

## 2018-04-11 VITALS — BP 124/74 | HR 57 | Ht 67.0 in | Wt 238.0 lb

## 2018-04-11 DIAGNOSIS — R7989 Other specified abnormal findings of blood chemistry: Secondary | ICD-10-CM | POA: Diagnosis not present

## 2018-04-11 DIAGNOSIS — J849 Interstitial pulmonary disease, unspecified: Secondary | ICD-10-CM

## 2018-04-11 DIAGNOSIS — R768 Other specified abnormal immunological findings in serum: Secondary | ICD-10-CM

## 2018-04-11 LAB — PULMONARY FUNCTION TEST
DL/VA % pred: 106 %
DL/VA: 4.68 ml/min/mmHg/L
DLCO UNC % PRED: 58 %
DLCO UNC: 16.68 ml/min/mmHg
FEF 25-75 Pre: 4.17 L/sec
FEF2575-%PRED-PRE: 177 %
FEV1-%Pred-Pre: 76 %
FEV1-Pre: 2.29 L
FEV1FVC-%PRED-PRE: 120 %
FEV6-%PRED-PRE: 67 %
FEV6-Pre: 2.57 L
FEV6FVC-%PRED-PRE: 106 %
FVC-%Pred-Pre: 63 %
FVC-Pre: 2.57 L
PRE FEV1/FVC RATIO: 89 %
PRE FEV6/FVC RATIO: 100 %

## 2018-04-11 NOTE — Progress Notes (Signed)
Spirometry and Dlco done today.

## 2018-04-11 NOTE — Progress Notes (Signed)
Subjective:     Patient ID: Stephen [name], male   DOB: 1951-07-25, 67 y.o.   MRN: [id number]  HPI   Patient ID: Stephen [name], male    DOB: Sep 25, 1951, 67 y.o.   MRN: [id number]  PCP Ronita Hipps, MD  HPI   IOV 03/05/2018  Chief Complaint  Patient presents with  . Consult    Referral per MD Helene Kelp, Dec 2018 had sinus infection and still has cough, hx of mild bronchiectasis, later was given symbicort which patient believes is making him more SOB.      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  He lives in [place] and presents with his wife for evaluation of pulmonary fibrosis to the ILD clinic.  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  During this time he was not exposed to any metal dust but he was exposed to mold and damp environments intermittently but not always.  He denies any mold or mildew in the house.  He tells me that in December 2018 he had a sinus infection and since then has a lingering cough that never really went away.  It looks like from the primary care physician he saw dentist and from there he had imaging and this finally all resulted in a high-resolution CT chest January 21, 2018 that I personally visualized.  The official report is that it is indeterminate for UIP.  In my personal visualization there is bilateral bibasal symmetric disease with definite of craniocaudal gradient.  The septal thickening and also cylindrical bronchiectasis and peripheral bronchiec bronchiolectasis.  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  He does not have much of her shortness of breath but then he is morbidly obese and has balance issues and is quite limited.  He was seen by a local pulmonologist will give him a 2-5-year survival rate and therefore he is frustrated.  He was started on Symbicort which actually made things  worse for him.  [place] pulmonary interstitial lung disease questionnaire  Symptoms: Since dec 2019. Some sputjm +. Better since started. Insidious dyspnea x 4 months. Same since onset. No episodic dyspnea. lLeve 4 dyspnea walking wiuth peers and level 5 walking up hill/stairs. Level 1 for dressing, Level 2 fo rshopping and mpwing lawn  ROS: chronic balance issues affecting mobiluity.  In association with this he has fatigue for the last 3 years.  He has dry mouth for the last several years associated with some dysphagia for the last 10 years heartburn and acid reflux for several years.  Daytime set sleepiness for the last several years but denies any oral ulcers or rash or weight loss or recurrent fever or arthralgia  Past medical history: Denies any asthma COPD or heart failure rheumatoid arthritis or scleroderma or systemic lupus erythematosus or polymyositis or Sjogren's but is positive for sleep apnea and hiatal hernia.  Negative for pulmonary hypertension diabetes thyroid disease stroke seizures hepatitis tuberculosis kidney disease blood clots heart disease other than murmur pleurisy  Family history of pulmonary disease: Positive for asthma in the mother but nobody with pulmonary fibrosis  Personal exposure history: He has never smoked cigarettes did smoke cigars for 3 years.  [place]  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  During this time there is intermittent mold exposure  Pulmonary toxicity history denies a laundry list of pulmonary toxicity medications    OV 04/11/2018  Chief Complaint  Patient presents with  . Follow-up    PFT performed today.  Pt is still coughing with occ. white mucus  and pt also states he believes SOB is worse and is becoming SOB more easily.    Follow-up interstitial lung disease work-up  Symptoms: In the interim no new symptoms.  He does admit to having chronic arthralgia for many decades after sheetrock fell on him.  He also has joint stiffness especially in the hands but he does not know if it is early morning stiffness.  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 Lorin Picket.  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  His autoimmune and vascular does panel shows trace positivity for ANCA and cyclic citrulline peptide.      Results for JAMELL, LAYMON (MRN [id number]) as of 03/19/2018 17:35  Ref. Range 03/05/2018 15:48  CK Total Latest Ref Range: 44 - 196 U/L 105  CK, MB Latest Ref Range: 0 - 5.0 ng/mL 2.3  Aldolase Latest Ref Range: < OR = 8.1 U/L 5.8  Sed Rate Latest Ref Range: 0 - 20 mm/hr 21 (H)  ASPERGILLUS FUMIGATUS Latest Ref Range: NEGATIVE  NEGATIVE  [place] Serum Latest Ref Range: NEGATIVE  NEGATIVE  Anit Nuclear Antibody(ANA) Latest Ref Range: NEGATIVE  NEGATIVE  Cyclic Citrullin Peptide Ab Latest Units: UNITS 48 (H)  ds DNA Ab Latest Units: IU/mL <1  Myeloperoxidase Abs Latest Units: AI <1.0  Serine Protease 3 Latest Units: AI <1.0  RA Latex Turbid. Latest Ref Range: <14 IU/mL <14  Atypical P-ANCA titer Latest Ref Range: <1:20 titer 1:40 (H)  Scleroderma (Scl-70) (ENA) Antibody, IgG Latest Ref Range: <1.0 NEG AI <1.0 NEG  Faenia retivirgula Latest Ref Range: NEGATIVE  NEGATIVE  S. VIRIDIS Latest Ref Range: NEGATIVE  NEGATIVE  T. CANDIDUS Latest Ref Range: NEGATIVE  NEGATIVE  T. VULGARIS Latest Ref Range: NEGATIVE  NEGATIVE      Simple office walk 185 feet x  3 laps goal with forehead probe 03/05/2018   O2 used Room air  Number laps completed All 3 laops  Comments about pace Walks very slow due to baseline  balance issue "40% of balance only due to remote trauma  Resting Pulse Ox/HR 100% and 55/min  Final Pulse Ox/HR 97% and 78/min  Desaturated </= 88% no  Desaturated <= 3% points yes  Got Tachycardic >/= 90/min no  Symptoms at end of test No, denied dyspnea  Miscellaneous comments npne   Results for AKIM, WATKINSON (MRN [id number]) as of 04/11/2018 11:50  Ref. Range 04/11/2018 10:31  FVC-Pre Latest Units: L 2.57  FVC-%Pred-Pre Latest Units: % 63  FEV1-Pre Latest Units: L 2.29  FEV1-%Pred-Pre Latest Units: % 76  Pre FEV1/FVC ratio Latest Units: % 89  FEV1FVC-%Pred-Pre Latest Units: % 120  FEF 25-75 Pre Latest Units: L/sec 4.17  FEF2575-%Pred-Pre Latest Units: % 177  FEV6-Pre Latest Units: L 2.57  FEV6-%Pred-Pre Latest Units: % 67  Pre FEV6/FVC Ratio Latest Units: %  100  FEV6FVC-%Pred-Pre Latest Units: % 106  DLCO unc Latest Units: ml/min/mmHg 16.68  DLCO unc % pred Latest Units: % 58  DL/VA Latest Units: ml/min/mmHg/L 4.68  DL/VA % pred Latest Units: % 106       has a past medical history of Allergy, Arthritis, GERD (gastroesophageal reflux disease), Heart murmur, Hiatal hernia, Hyperlipidemia, Hypertension, Sleep apnea, and Vertigo.   reports that he has never smoked. He has never used smokeless tobacco.  Past Surgical History:  Procedure Laterality Date  . CARDIAC CATHETERIZATION  03/22/2001  . LEG SURGERY Bilateral 04/2001    Allergies  Allergen Reactions  . Ciprofloxacin Other (See Comments)    colitis  . Codeine     Immunization History  Administered Date(s) Administered  . Influenza, High Dose Seasonal PF 08/28/2017  . Pneumococcal Conjugate-13 08/25/2016  . Pneumococcal Polysaccharide-23 11/18/2007    Family History  Problem Relation Age of Onset  . Dementia Mother   . Heart attack Father   . Aneurysm Father   . Cancer Maternal Grandmother   . Aneurysm Maternal Grandmother      Current Outpatient Medications:  .  amitriptyline (ELAVIL) 75 MG tablet,  Take 75 mg by mouth at bedtime., Disp: , Rfl:  .  amLODipine (NORVASC) 10 MG tablet, Take 10 mg by mouth daily., Disp: , Rfl:  .  aspirin EC 81 MG tablet, Take 81 mg by mouth daily., Disp: , Rfl:  .  calcium carbonate (OSCAL) 1500 (600 Ca) MG TABS tablet, Take by mouth 2 (two) times daily with a meal., Disp: , Rfl:  .  carvedilol (COREG) 25 MG tablet, Take 25 mg by mouth 2 (two) times daily with a meal., Disp: , Rfl:  .  cholecalciferol (VITAMIN D) 1000 units tablet, Take 1,000 Units by mouth daily., Disp: , Rfl:  .  cloNIDine (CATAPRES) 0.3 MG tablet, Take 0.3 mg by mouth 2 (two) times daily., Disp: , Rfl:  .  dicyclomine (BENTYL) 10 MG capsule, Take 10 mg by mouth 4 (four) times daily -  before meals and at bedtime., Disp: , Rfl:  .  fenofibrate micronized (LOFIBRA) 134 MG capsule, Take 134 mg by mouth every other day., Disp: , Rfl:  .  lansoprazole (PREVACID) 30 MG capsule, Take 30 mg by mouth daily at 12 noon., Disp: , Rfl:  .  lisinopril (PRINIVIL,ZESTRIL) 20 MG tablet, Take 1 tablet (20 mg total) by mouth daily., Disp: 30 tablet, Rfl: 3 .  lovastatin (MEVACOR) 20 MG tablet, Take 20 mg by mouth every other day., Disp: , Rfl:  .  montelukast (SINGULAIR) 10 MG tablet, Take 10 mg by mouth at bedtime., Disp: , Rfl:  .  Multiple Vitamin (MULTIVITAMIN WITH MINERALS) TABS tablet, Take 1 tablet by mouth daily., Disp: , Rfl:  .  nabumetone (RELAFEN) 750 MG tablet, Take 750 mg by mouth 2 (two) times daily., Disp: , Rfl:  .  polyethylene glycol (MIRALAX / GLYCOLAX) packet, Take 17 g by mouth daily., Disp: , Rfl:  .  spironolactone (ALDACTONE) 25 MG tablet, Take 25 mg by mouth 3 (three) times a week., Disp: , Rfl:  .  UNABLE TO FIND, Take 3 capsules by mouth daily. Med Name: clear lungs, Disp: , Rfl:  .  vitamin E 400 UNIT capsule, Take 400 Units by mouth daily., Disp: , Rfl:  .  acetylcysteine (MUCOMYST) 10 % nebulizer solution, , Disp: , Rfl:  .  calcium carbonate (TUMS - DOSED IN MG ELEMENTAL  CALCIUM) 500 MG chewable  tablet, Chew 1 tablet by mouth daily., Disp: , Rfl:  .  ipratropium-albuterol (DUONEB) 0.5-2.5 (3) MG/3ML SOLN, Inhale 3 mLs into the lungs as needed., Disp: , Rfl:   Review of Systems     Objective:   Physical Exam Vitals:   04/11/18 1126  BP: 124/74  Pulse: (!) 57  SpO2: 97%  Weight: 238 lb (108 kg)  Height: _0  (1.702 m)    Obese Has cane Mild basal crackles Has warm joints of the wrist and fingers    Assessment:       ICD-10-CM   1. ILD (interstitial lung disease) ([place]) J84.9   2. Cyclic citrullinated peptide (CCP) antibody positive R79.89        Plan:      Diagnosis either IPF or Rheumatoid arthritis ILD.  Suspicion for rheumatoid arthritis ILD because of history of arthralgia and also on exam warm joints and also trace positive CCP although rheumatoid factor is negative. Severity is moderate Outlooks: Both are progressive with time Treatment: We treat them differently in an attempt to prevent things getting worse -nintedanib or pirfenidone for IPF versus CellCept for rheumatoid arthritis ILD  Plan Refer rheumatology  Followup 6 weeks ILD clinic or 30 min slot to discuss further especially therapy   > 50% of this > 25 min visit spent in face to face counseling or coordination of care    Dr. Brand Males, M.D., [facility].C.P Pulmonary and Critical Care Medicine Staff Physician, [place] Director - Interstitial Lung Disease  Program  Pulmonary [place] at [place], [place], [address]  Pager: [phone], If no answer or between  15:00h - 7:00h: call [phone] Telephone: [phone]

## 2018-04-11 NOTE — Patient Instructions (Signed)
ICD-10-CM   1. ILD (interstitial lung disease) ([place]) J84.9   2. Cyclic citrullinated peptide (CCP) antibody positive R79.89     Diagnosis either IPF or Rheumatoid arthritis ILD Severity is moderate Outlooks: Both are progressive with time Treatment: We treat them differently in an attempt to prevent things getting worse  Plan Refer rheumatology  Followup 6 weeks ILD clinic or 30 min slot to discuss further especially therapy

## 2018-04-22 DIAGNOSIS — K219 Gastro-esophageal reflux disease without esophagitis: Secondary | ICD-10-CM | POA: Diagnosis not present

## 2018-04-22 DIAGNOSIS — K2270 Barrett's esophagus without dysplasia: Secondary | ICD-10-CM | POA: Diagnosis not present

## 2018-04-25 DIAGNOSIS — J849 Interstitial pulmonary disease, unspecified: Secondary | ICD-10-CM | POA: Diagnosis not present

## 2018-04-25 DIAGNOSIS — R768 Other specified abnormal immunological findings in serum: Secondary | ICD-10-CM | POA: Diagnosis not present

## 2018-04-25 DIAGNOSIS — Z6837 Body mass index (BMI) 37.0-37.9, adult: Secondary | ICD-10-CM | POA: Diagnosis not present

## 2018-04-25 DIAGNOSIS — E669 Obesity, unspecified: Secondary | ICD-10-CM | POA: Diagnosis not present

## 2018-05-10 ENCOUNTER — Other Ambulatory Visit: Payer: Self-pay

## 2018-05-10 DIAGNOSIS — G4733 Obstructive sleep apnea (adult) (pediatric): Secondary | ICD-10-CM | POA: Diagnosis not present

## 2018-05-10 MED ORDER — LISINOPRIL 20 MG PO TABS: 20.0000 mg | ORAL_TABLET | Freq: Every day | ORAL | 3 refills | Status: DC

## 2018-05-29 DIAGNOSIS — J849 Interstitial pulmonary disease, unspecified: Secondary | ICD-10-CM | POA: Diagnosis not present

## 2018-05-29 DIAGNOSIS — E119 Type 2 diabetes mellitus without complications: Secondary | ICD-10-CM | POA: Diagnosis not present

## 2018-06-04 ENCOUNTER — Other Ambulatory Visit (INDEPENDENT_AMBULATORY_CARE_PROVIDER_SITE_OTHER): Payer: PPO

## 2018-06-04 ENCOUNTER — Encounter: Payer: Self-pay | Admitting: Internal Medicine

## 2018-06-04 ENCOUNTER — Ambulatory Visit (INDEPENDENT_AMBULATORY_CARE_PROVIDER_SITE_OTHER): Payer: PPO | Admitting: Internal Medicine

## 2018-06-04 ENCOUNTER — Ambulatory Visit: Payer: PPO | Admitting: Internal Medicine

## 2018-06-04 VITALS — BP 130/80 | HR 57 | Ht 67.0 in | Wt 244.2 lb

## 2018-06-04 DIAGNOSIS — J84112 Idiopathic pulmonary fibrosis: Secondary | ICD-10-CM

## 2018-06-04 LAB — HEPATIC FUNCTION PANEL
ALBUMIN: 4.6 g/dL (ref 3.5–5.2)
ALK PHOS: 61 U/L (ref 39–117)
ALT: 22 U/L (ref 0–53)
AST: 20 U/L (ref 0–37)
BILIRUBIN DIRECT: 0.1 mg/dL (ref 0.0–0.3)
BILIRUBIN TOTAL: 0.4 mg/dL (ref 0.2–1.2)
Total Protein: 8.1 g/dL (ref 6.0–8.3)

## 2018-06-04 NOTE — Patient Instructions (Addendum)
ICD-10-CM   1. IPF (idiopathic pulmonary fibrosis) ([place]) J84.112     New diagnosis of IPF - 06/04/2018   Plan [place] LFT 06/04/2018 ahead of ofev start Start ofev per protocol IN future we discuss research trials Yes join [place] as well  Followup 4-6 weeks to ILD clinic to report progress

## 2018-06-04 NOTE — Progress Notes (Signed)
Subjective:     Patient ID: Stephen [name], male   DOB: 1950/11/13, 67 y.o.   MRN: [id number]  HPI  Patient ID: Stephen [name], male    DOB: 10-Jul-1951, 67 y.o.   MRN: [id number]  PCP Ronita Hipps, MD  HPI   IOV 03/05/2018  Chief Complaint  Patient presents with  . Consult    Referral per MD Helene Kelp, Dec 2018 had sinus infection and still has cough, hx of mild bronchiectasis, later was given symbicort which patient believes is making him more SOB.      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  He lives in [place] and presents with his wife for evaluation of pulmonary fibrosis to the ILD clinic.  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  During this time he was not exposed to any metal dust but he was exposed to mold and damp environments intermittently but not always.  He denies any mold or mildew in the house.  He tells me that in December 2018 he had a sinus infection and since then has a lingering cough that never really went away.  It looks like from the primary care physician he saw dentist and from there he had imaging and this finally all resulted in a high-resolution CT chest January 21, 2018 that I personally visualized.  The official report is that it is indeterminate for UIP.  In my personal visualization there is bilateral bibasal symmetric disease with definite of craniocaudal gradient.  The septal thickening and also cylindrical bronchiectasis and peripheral bronchiec bronchiolectasis.  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  He does not have much of her shortness of breath but then he is morbidly obese and has balance issues and is quite limited.  He was seen by a local pulmonologist will give him a 2-5-year survival rate and therefore he is frustrated.  He was started on Symbicort which actually made things  worse for him.  [place] pulmonary interstitial lung disease questionnaire  Symptoms: Since dec 2019. Some sputjm +. Better since started. Insidious dyspnea x 4 months. Same since onset. No episodic dyspnea. lLeve 4 dyspnea walking wiuth peers and level 5 walking up hill/stairs. Level 1 for dressing, Level 2 fo rshopping and mpwing lawn  ROS: chronic balance issues affecting mobiluity.  In association with this he has fatigue for the last 3 years.  He has dry mouth for the last several years associated with some dysphagia for the last 10 years heartburn and acid reflux for several years.  Daytime set sleepiness for the last several years but denies any oral ulcers or rash or weight loss or recurrent fever or arthralgia  Past medical history: Denies any asthma COPD or heart failure rheumatoid arthritis or scleroderma or systemic lupus erythematosus or polymyositis or Sjogren's but is positive for sleep apnea and hiatal hernia.  Negative for pulmonary hypertension diabetes thyroid disease stroke seizures hepatitis tuberculosis kidney disease blood clots heart disease other than murmur pleurisy  Family history of pulmonary disease: Positive for asthma in the mother but nobody with pulmonary fibrosis  Personal exposure history: He has never smoked cigarettes did smoke cigars for 3 years.  [place]  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  During this time there is intermittent mold exposure  Pulmonary toxicity history denies a laundry list of pulmonary toxicity medications    OV 04/11/2018  Chief Complaint  Patient presents with  . Follow-up    PFT performed today.  Pt is still coughing with occ. white mucus  and pt also states he believes SOB is worse and is becoming SOB more easily.    Follow-up interstitial lung disease work-up  Symptoms: In the interim no new symptoms.  He does admit to having chronic arthralgia for many decades after sheetrock fell on him.  He also has joint stiffness especially in the hands but he does not know if it is early morning stiffness.  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 Lorin Picket.  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  His autoimmune and vascular does panel shows trace positivity for ANCA and cyclic citrulline peptide.    OV 06/04/2018   Chief Complaint  Patient presents with  . Follow-up    Pt states things have been about the same since last visit.   [place] ILD - ccp and atypical p-anca trace positive May 2019. HP panel negative.  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 Lung function: Severity -FVC 63% and DLCO 58% showing moderate ILD. Rheum evaluation - 04/25/18 - no evidence of RA (Dr Trudie Reed)  Here with his wife to discuss test results. In the interim he saw Dr. Gavin Pound on 04/25/2018. She is of rheumatology. Clinically she did not find any evidence of systemic rheumatoid arthritis of vasculitis. Therefore he is here for decision making regarding his ILD. In the interim there are no new issues.   Simple office walk 185 feet x  3 laps goal with forehead probe 03/05/2018  06/04/2018   O2 used Room air Room air  Number laps completed All 3 laops All 3 laps  Comments about pace Walks very slow due to baseline balance issue "40% of balance only due to remote trauma   Resting Pulse Ox/HR 100% and 55/min 100% and 57/min  Final Pulse Ox/HR 97% and 78/min 97% and 77/mi  Desaturated </= 88% no no  Desaturated <= 3% points yes yes  Got Tachycardic >/= 90/min no no  Symptoms at end of test No, denied dyspnea x   Miscellaneous comments npne x  Review of Systems     Objective:   Physical Exam  Vitals:   06/04/18 1433  BP: 130/80  Pulse: (!) 57  SpO2: 100%  Weight: 244 lb 3.2 oz (110.8 kg)  Height: _0  (1.702 m)    Estimated body mass index is 38.25 kg/m as calculated from the following:   Height as of this encounter: _1  (1.702 m).   Weight as of this encounter: 244 lb 3.2 oz (110.8 kg).       Assessment:       ICD-10-CM   1. IPF (idiopathic pulmonary fibrosis) ([facility]) J84.112    Clinical diagnosis of IPF given to him based on age greater than 35, male, well, white ethnic, nonspecific occupational exposures probable UIP on CT scan and negative rheumatology evaluation. At this point  in time is at high pretest probability that he does not need surgical lung biopsy.     Plan:     Discussed   Re IPF - Course   - progressive disease in almost all patients (> 90%); typically few to several year progression   - super unlukcy 10% - progress to death in a year   - super lucky < 10% - stability > 5 years or even rarely 10 years   -unpredictable in each individual - Rx:  anti-fibrotics + since 2014 but they can only slow disease down; preventative. Not Rx symptoms  - they have side effects including intolerance is < 1/3rd patients  - most 55-65% patients tolerate them well  - further details below - Other pillars of management  - Symptoms - cough and dyspnea RX - willt ry insp muscle trainer  - research - introduced concept  - rehab, transplatn, support group - discuss in future   =Anti-fibrotic Drugs Both drugs OFEV and Esbriet only slow down progression, 1 out of 6 patients  - this means extension in quality of life but no difference in symptoms  - no study directly compares the 2 drugs but efficacy roughly equal at 1 year time point   - OFEV  - - time to first exacerbation possibly reduced in one trial but not in another - twice daily, no titration, potentially more  convenient dosing  - no need for sunscreen  - high chance of mild diarrhea but low chance of significant diarrhea needing to stop medication.   - Rx diarrhea with lomotil - slight increase in heart attack risk and theoretical increase in bleeding risk,   - need monthly blood work for 3 months and then every 6 months - monitor liver function   - ESBRIET  - 3 pill three times daily, slow titration.  - Need to wean sunscreen  - Some chance of nausea and anorexia with small chance for diarrhea  - no known heart attack risk - no known bleeding risk,   - need monthly blood work for 6 months - monitor liver function - possible mortality benefit in pooled analysis  - larger world wide experience    We discussed anti-fibrotic therapy for potentialdiagnosis of IPF. He does not have any cardiac disease. He is not on any anticoagulants other than baby aspirin. He does not have any bleeding diathesis. He does not have any ulcer disease. He prefers diarrhea as a side effect over nausea and fatigue. Therefore his choice agent is  Ofev.     > 50% of this > 25 min visit spent in face to face counseling or coordination of care - by this undersigned MD - Dr Brand Males. This includes one or more of the following documented above: discussion of test results, diagnostic or treatment recommendations, prognosis, risks and benefits of management options, instructions, education, compliance or risk-factor reduction   Dr. Brand Males, M.D., [facility].C.P Pulmonary and Critical Care Medicine Staff Physician, [place] Director - Interstitial Lung Disease  Program  Pulmonary [place] at [place], [place], [address]  Pager: [phone], If no answer or between  15:00h - 7:00h: call [phone] Telephone: [phone]

## 2018-06-07 NOTE — Progress Notes (Signed)
Spoke with pt and notified of results per Dr. Chase Caller. Pt verbalized understanding and denied any questions.

## 2018-06-10 DIAGNOSIS — G4733 Obstructive sleep apnea (adult) (pediatric): Secondary | ICD-10-CM | POA: Diagnosis not present

## 2018-06-13 ENCOUNTER — Telehealth: Payer: Self-pay | Admitting: Internal Medicine

## 2018-06-13 NOTE — Telephone Encounter (Signed)
Application was filled out and signed 06/04/18 at pt's last OV with MR and was faxed to [facility].  Called [facility] to check on the status of pt's medication as it is a new start.  Spoke with Nevin Bloodgood, pharmacist who stated med was shipped 06/06/18 to arrive 06/07/18.  Nothing further needed.

## 2018-07-11 DIAGNOSIS — G4733 Obstructive sleep apnea (adult) (pediatric): Secondary | ICD-10-CM | POA: Diagnosis not present

## 2018-07-16 ENCOUNTER — Encounter: Payer: Self-pay | Admitting: Internal Medicine

## 2018-07-16 ENCOUNTER — Other Ambulatory Visit (INDEPENDENT_AMBULATORY_CARE_PROVIDER_SITE_OTHER): Payer: PPO

## 2018-07-16 ENCOUNTER — Ambulatory Visit (INDEPENDENT_AMBULATORY_CARE_PROVIDER_SITE_OTHER): Payer: PPO | Admitting: Internal Medicine

## 2018-07-16 ENCOUNTER — Telehealth: Payer: Self-pay | Admitting: Internal Medicine

## 2018-07-16 VITALS — BP 138/82 | HR 64 | Ht 67.0 in | Wt 240.2 lb

## 2018-07-16 DIAGNOSIS — J84112 Idiopathic pulmonary fibrosis: Secondary | ICD-10-CM | POA: Diagnosis not present

## 2018-07-16 DIAGNOSIS — Z5181 Encounter for therapeutic drug level monitoring: Secondary | ICD-10-CM

## 2018-07-16 LAB — HEPATIC FUNCTION PANEL
ALBUMIN: 4.3 g/dL (ref 3.5–5.2)
ALT: 51 U/L (ref 0–53)
AST: 36 U/L (ref 0–37)
Alkaline Phosphatase: 69 U/L (ref 39–117)
BILIRUBIN DIRECT: 0.1 mg/dL (ref 0.0–0.3)
BILIRUBIN TOTAL: 0.4 mg/dL (ref 0.2–1.2)
Total Protein: 7.7 g/dL (ref 6.0–8.3)

## 2018-07-16 NOTE — Addendum Note (Signed)
Addended by: Lorretta Harp on: 07/16/2018 03:22 PM   Modules accepted: Orders

## 2018-07-16 NOTE — Patient Instructions (Addendum)
ICD-10-CM   1. IPF (idiopathic pulmonary fibrosis) ([place]) J84.112   2. Therapeutic drug monitoring Z51.81     Clinically stable Glad you are tolerating Ofev well so far since 06/09/18  Plan  - definitely attend PFF meeting 07/25/18 at 6pm at [facility]  - take address from [place]  - deferred flu shot; you will have with pcp - any respiratory infection please call us [phone] anytime - respect your interest in research trials  - [place] and rehab studies are potentials; we can discuss at next visti  - so hold off standard of care rehab - my office will fill out [place] form - followup with Raquel Sarna - check LFT 07/16/2018   Followup - 4-6 weeks to Pre-bd spiro and dlco only. No lung volume or bd response. No post-bd spiro - 4-6 weeks ILD clnic with Dr Chase Caller -  - will do simple (not 6wmd) at that time

## 2018-07-16 NOTE — Telephone Encounter (Signed)
Patient seen by Dr. Chase Caller today Raquel Sarna gave me paperwork from the patient for disability forms to be completed for patient to receive services from the [place]. Fwd to [employer] to see if they will process these forms. Sent via [employer] - pr

## 2018-07-16 NOTE — Progress Notes (Signed)
IOV 03/05/2018  Chief Complaint  Patient presents with  . Consult    Referral per MD Helene Kelp, Dec 2018 had sinus infection and still has cough, hx of mild bronchiectasis, later was given symbicort which patient believes is making him more SOB.      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  He lives in [facility] and presents with his wife for evaluation of pulmonary fibrosis to the ILD clinic.  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  During this time he was not exposed to any metal dust but he was exposed to mold and damp environments intermittently but not always.  He denies any mold or mildew in the house.  He tells me that in December 2018 he had a sinus infection and since then has a lingering cough that never really went away.  It looks like from the primary care physician he saw dentist and from there he had imaging and this finally all resulted in a high-resolution CT chest January 21, 2018 that I personally visualized.  The official report is that it is indeterminate for UIP.  In my personal visualization there is bilateral bibasal symmetric disease with definite of craniocaudal gradient.  The septal thickening and also cylindrical bronchiectasis and peripheral bronchiec bronchiolectasis.  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  He does not have much of her shortness of breath but then he is morbidly obese and has balance issues and is quite limited.  He was seen by a local pulmonologist will give him a 2-5-year survival rate and therefore he is frustrated.  He was started on Symbicort which actually made things worse for him.  [facility] pulmonary interstitial lung disease questionnaire  Symptoms: Since dec 2019. Some sputjm +. Better since started. Insidious dyspnea x 4 months. Same since onset. No episodic  dyspnea. lLeve 4 dyspnea walking wiuth peers and level 5 walking up hill/stairs. Level 1 for dressing, Level 2 fo rshopping and mpwing lawn  ROS: chronic balance issues affecting mobiluity.  In association with this he has fatigue for the last 3 years.  He has dry mouth for the last several years associated with some dysphagia for the last 10 years heartburn and acid reflux for several years.  Daytime set sleepiness for the last several years but denies any oral ulcers or rash or weight loss or recurrent fever or arthralgia  Past medical history: Denies any asthma COPD or heart failure rheumatoid arthritis or scleroderma or systemic lupus erythematosus or polymyositis or Sjogren's but is positive for sleep apnea and hiatal hernia.  Negative for pulmonary hypertension diabetes thyroid disease stroke seizures hepatitis tuberculosis kidney disease blood clots heart disease other than murmur pleurisy  Family history of pulmonary disease: Positive for asthma in the mother but nobody with pulmonary fibrosis  Personal exposure history: He has never smoked cigarettes did smoke cigars for 3 years.  [place]  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  During this time there is intermittent mold  exposure  Pulmonary toxicity history denies a laundry list of pulmonary toxicity medications    OV 04/11/2018  Chief Complaint  Patient presents with  . Follow-up    PFT performed today.  Pt is still coughing with occ. white mucus and pt also states he believes SOB is worse and is becoming SOB more easily.    Follow-up interstitial lung disease work-up  Symptoms: In the interim no new symptoms.  He does admit to having  chronic arthralgia for many decades after sheetrock fell on him.  He also has joint stiffness especially in the hands but he does not know if it is early morning stiffness.  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 Lorin Picket.  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  His autoimmune and vascular does panel shows trace positivity for ANCA and cyclic citrulline peptide.    OV 06/04/2018   Chief Complaint  Patient presents with  . Follow-up    Pt states things have been about the same since last visit.   FU ILD - ccp and atypical p-anca trace positive May 2019. HP panel negative.  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 Lung function: Severity -FVC 63% and DLCO 58% showing moderate ILD. Rheum evaluation - 04/25/18 - no evidence of RA (Dr Trudie Reed)  Here with his wife to discuss test results. In the interim he saw Dr. Gavin Pound on 04/25/2018. She is of rheumatology. Clinically she did not find any evidence of systemic rheumatoid arthritis or vasculitis. Therefore he is here for decision making regarding his ILD. In the interim there are no new issues.  OV 07/16/2018  Subjective:  Patient ID: Stephen [name], male , DOB: 1951-06-23 , age 27 y.o. , MRN: [id number] , ADDRESS: [address] [place] [address]   07/16/2018 -   Chief Complaint  Patient presents with  . Follow-up    Pt started OFEV 8/11 and so far denies any complaints.    FU ILD - ccp and atypical p-anca trace positive May 2019. HP panel negative.  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 Lung function: Severity -FVC 63% and DLCO 58% showing moderate ILD. Rheum evaluation - 04/25/18 - no evidence of RA (Dr Trudie Reed). Dx of IPF given 06/04/18. STarted ofev 8/11/9    HPI [name] 67 y.o. - presents for follow-up of his idiopathic pulmonary fibrosis. Diagnoses given 06/04/2018. He is started on  Ofev 06/09/2018. He is here with his wife. He says he is tolerating the Ofev just fine. No GI issues. He gets support from the Child psychotherapist Clarene Critchley. He will have a liver function test today. He deferred flu shot because he will have it with his primary care physician. He told me that he is planning to join the pulmonary fibrosis foundation patient support group. Next meeting is 07/25/2018 Thursday 6 PM at [facility]. He is also willing to join research trials.He has mobility issues on account of his obesity and cane use. He says he cannot do a treadmill or walk long distances.   He has chronic tinnitus and apparently this is from working in [employer] base during [place] War although he was never deployed overseas. He started getting VA eligibility. He asked [place] to fill forms.  Simple office walk 185 feet x  3 laps goal with  forehead probe 03/05/2018  06/04/2018  07/16/2018   O2 used Room air Room air   Number laps completed All 3 laops All 3 laps   Comments about pace Walks very slow due to baseline balance issue "40% of balance only due to remote trauma    Resting Pulse Ox/HR 100% and 55/min 100% and 57/min   Final Pulse Ox/HR 97% and 78/min 97% and 77/mi   Desaturated </= 88% no no   Desaturated <= 3% points yes yes   Got Tachycardic >/= 90/min no no   Symptoms at end of test No, denied dyspnea x   Miscellaneous comments npne x   Review of Systems    Results for PLUMER, MITTELSTAEDT (MRN [id number]) as of 07/16/2018 14:46  Ref. Range 04/11/2018 10:31  FVC-Pre Latest Units: L 2.57  FVC-%Pred-Pre Latest Units: % 63   Results for KELCEY, WICKSTROM (MRN [id number]) as of 07/16/2018 14:46  Ref. Range 04/11/2018 10:31  DLCO unc Latest Units: ml/min/mmHg 16.68  DLCO unc % pred Latest Units: % 58    ROS - per HPI     has a past medical history of Allergy, Arthritis, GERD (gastroesophageal reflux disease), Heart murmur, Hiatal hernia, Hyperlipidemia, Hypertension, Sleep  apnea, and Vertigo.   reports that he has never smoked. He has never used smokeless tobacco.  Past Surgical History:  Procedure Laterality Date  . CARDIAC CATHETERIZATION  03/22/2001  . LEG SURGERY Bilateral 04/2001    Allergies  Allergen Reactions  . Ciprofloxacin Other (See Comments)    colitis  . Codeine     Immunization History  Administered Date(s) Administered  . Influenza, High Dose Seasonal PF 08/28/2017  . Pneumococcal Conjugate-13 08/25/2016  . Pneumococcal Polysaccharide-23 11/18/2007    Family History  Problem Relation Age of Onset  . Dementia Mother   . Heart attack Father   . Aneurysm Father   . Cancer Maternal Grandmother   . Aneurysm Maternal Grandmother      Current Outpatient Medications:  .  amitriptyline (ELAVIL) 75 MG tablet, Take 75 mg by mouth at bedtime., Disp: , Rfl:  .  amLODipine (NORVASC) 10 MG tablet, Take 10 mg by mouth daily., Disp: , Rfl:  .  aspirin EC 81 MG tablet, Take 81 mg by mouth daily., Disp: , Rfl:  .  calcium carbonate (OSCAL) 1500 (600 Ca) MG TABS tablet, Take by mouth 2 (two) times daily with a meal., Disp: , Rfl:  .  calcium carbonate (TUMS - DOSED IN MG ELEMENTAL CALCIUM) 500 MG chewable tablet, Chew 1 tablet by mouth daily., Disp: , Rfl:  .  carvedilol (COREG) 25 MG tablet, Take 25 mg by mouth 2 (two) times daily with a meal., Disp: , Rfl:  .  cholecalciferol (VITAMIN D) 1000 units tablet, Take 1,000 Units by mouth daily., Disp: , Rfl:  .  cloNIDine (CATAPRES) 0.3 MG tablet, Take 0.3 mg by mouth 2 (two) times daily., Disp: , Rfl:  .  dicyclomine (BENTYL) 10 MG capsule, Take 10 mg by mouth 4 (four) times daily -  before meals and at bedtime., Disp: , Rfl:  .  fenofibrate micronized (LOFIBRA) 134 MG capsule, Take 134 mg by mouth every other day., Disp: , Rfl:  .  lansoprazole (PREVACID) 30 MG capsule, Take 30 mg by mouth daily at 12 noon., Disp: , Rfl:  .  lisinopril (PRINIVIL,ZESTRIL) 20 MG tablet, Take 1 tablet (20 mg total)  by mouth daily., Disp: 30 tablet, Rfl: 3 .  lovastatin (MEVACOR) 20  MG tablet, Take 20 mg by mouth every other day., Disp: , Rfl:  .  montelukast (SINGULAIR) 10 MG tablet, Take 10 mg by mouth at bedtime., Disp: , Rfl:  .  Multiple Vitamin (MULTIVITAMIN WITH MINERALS) TABS tablet, Take 1 tablet by mouth daily., Disp: , Rfl:  .  nabumetone (RELAFEN) 750 MG tablet, Take 750 mg by mouth 2 (two) times daily., Disp: , Rfl:  .  OFEV 150 MG CAPS, , Disp: , Rfl:  .  polyethylene glycol (MIRALAX / GLYCOLAX) packet, Take 17 g by mouth daily., Disp: , Rfl:  .  spironolactone (ALDACTONE) 25 MG tablet, Take 25 mg by mouth 3 (three) times a week., Disp: , Rfl:  .  vitamin E 400 UNIT capsule, Take 400 Units by mouth daily., Disp: , Rfl:       Objective:   Vitals:   07/16/18 1431  BP: 138/82  Pulse: 64  SpO2: 97%  Weight: 240 lb 3.2 oz (109 kg)  Height: _0  (1.702 m)    Estimated body mass index is 37.62 kg/m as calculated from the following:   Height as of this encounter: _1  (1.702 m).   Weight as of this encounter: 240 lb 3.2 oz (109 kg).  _2 @  [employer]   07/16/18 1431  Weight: 240 lb 3.2 oz (109 kg)     Physical Exam  General Appearance:    Alert, cooperative, no distress, appears stated age - yes and obese , sitting on - chair  Head:    Normocephalic, without obvious abnormality, atraumatic  Eyes:    PERRL, conjunctiva/corneas clear,  Ears:    Normal TM's and external ear canals, both ears  Nose:   Nares normal, septum midline, mucosa normal, no drainage    or sinus tenderness. OXYGEN ON  - no . Patient is @ ra   Throat:   Lips, mucosa, and tongue normal; teeth and gums normal. Cyanosis on lips - no  Neck:   Supple, symmetrical, trachea midline, no adenopathy;    thyroid:  no enlargement/tenderness/nodules; no carotid   bruit or JVD  Back:     Symmetric, no curvature, ROM normal, no CVA tenderness  Lungs:     Distress - no , Wheeze no, Barrell Chest - no, Purse  lip breathing - no, Crackles - yes    Chest Wall:    No tenderness or deformity. Scars in chest no   Heart:    Regular rate and rhythm, S1 and S2 normal, no rub   or gallop, Murmur - no  Breast Exam:    NOT DONE  Abdomen:     Soft, non-tender, bowel sounds active all four quadrants,    no masses, no organomegaly  Genitalia:   NOT DONE  Rectal:   NOT DONE  Extremities:   Extremities normal, atraumatic, Clubbing - no, Edema - no  Pulses:   2+ and symmetric all extremities  Skin:   Stigmata of Connective Tissue Disease - no  Lymph nodes:   Cervical, supraclavicular, and axillary nodes normal  Psychiatric:  Neurologic:   pleasant CNII-XII intact, normal strength, sensation  throughout           Assessment:       ICD-10-CM   1. IPF (idiopathic pulmonary fibrosis) ([facility]) J84.112   2. Therapeutic drug monitoring Z51.81        Plan:      Clinically stable Glad you are tolerating Ofev well so far since 06/09/18  Plan  -  definitely attend PFF meeting 07/25/18 at 6pm at [facility]  - take address from [place]  - deferred flu shot; you will have with pcp - any respiratory infection please call us [phone] anytime - respect your interest in research trials  - [place] and rehab studies are potentials; we can discuss at next visti  - so hold off standard of care rehab - my office will fill out [place] form - followup with Raquel Sarna - check LFT 07/16/2018   Followup - 4-6 weeks to Pre-bd spiro and dlco only. No lung volume or bd response. No post-bd spiro - 4-6 weeks ILD clnic with Dr Chase Caller -  - will do simple (not 6wmd) at that time  > 50% of this > 25 min visit spent in face to face counseling or coordination of care - by this undersigned MD - Dr Brand Males. This includes one or more of the following documented above: discussion of test results, diagnostic or treatment recommendations, prognosis, risks and benefits of management options, instructions, education, compliance or  risk-factor reduction           SIGNATURE    Dr. Brand Males, M.D., F.C.C.P,  Pulmonary and Critical Care Medicine Staff Physician, [place] Director - Interstitial Lung Disease  Program  Pulmonary [place] [facility] [place], [place], [address]  Pager: [phone], If no answer or between  15:00h - 7:00h: call [phone] Telephone: [phone]  3:06 PM 07/16/2018

## 2018-07-19 DIAGNOSIS — E119 Type 2 diabetes mellitus without complications: Secondary | ICD-10-CM | POA: Diagnosis not present

## 2018-07-22 DIAGNOSIS — Z23 Encounter for immunization: Secondary | ICD-10-CM | POA: Diagnosis not present

## 2018-07-22 DIAGNOSIS — Z6837 Body mass index (BMI) 37.0-37.9, adult: Secondary | ICD-10-CM | POA: Diagnosis not present

## 2018-07-22 DIAGNOSIS — E119 Type 2 diabetes mellitus without complications: Secondary | ICD-10-CM | POA: Diagnosis not present

## 2018-07-22 DIAGNOSIS — I10 Essential (primary) hypertension: Secondary | ICD-10-CM | POA: Diagnosis not present

## 2018-08-10 DIAGNOSIS — G4733 Obstructive sleep apnea (adult) (pediatric): Secondary | ICD-10-CM | POA: Diagnosis not present

## 2018-08-13 ENCOUNTER — Ambulatory Visit: Payer: PPO | Admitting: Internal Medicine

## 2018-08-16 ENCOUNTER — Ambulatory Visit (INDEPENDENT_AMBULATORY_CARE_PROVIDER_SITE_OTHER): Payer: PPO | Admitting: Internal Medicine

## 2018-08-16 DIAGNOSIS — J84112 Idiopathic pulmonary fibrosis: Secondary | ICD-10-CM

## 2018-08-16 LAB — PULMONARY FUNCTION TEST
FEF 25-75 Pre: 3.23 L/sec
FEF2575-%Pred-Pre: 147 %
FEV1-%PRED-PRE: 81 %
FEV1-PRE: 2.26 L
FEV1FVC-%Pred-Pre: 120 %
FEV6-%PRED-PRE: 70 %
FEV6-Pre: 2.48 L
FEV6FVC-%PRED-PRE: 106 %
FVC-%PRED-PRE: 67 %
FVC-Pre: 2.53 L
PRE FEV1/FVC RATIO: 89 %
Pre FEV6/FVC Ratio: 100 %

## 2018-08-16 NOTE — Progress Notes (Signed)
Patient attempted his PFT. He gave an excellent effort but due to his coughing he could not complete the test.

## 2018-09-03 ENCOUNTER — Other Ambulatory Visit (INDEPENDENT_AMBULATORY_CARE_PROVIDER_SITE_OTHER): Payer: PPO

## 2018-09-03 ENCOUNTER — Ambulatory Visit (INDEPENDENT_AMBULATORY_CARE_PROVIDER_SITE_OTHER): Payer: PPO | Admitting: Internal Medicine

## 2018-09-03 ENCOUNTER — Encounter: Payer: Self-pay | Admitting: Internal Medicine

## 2018-09-03 VITALS — BP 122/84 | HR 67 | Ht 66.0 in | Wt 240.0 lb

## 2018-09-03 DIAGNOSIS — J84112 Idiopathic pulmonary fibrosis: Secondary | ICD-10-CM

## 2018-09-03 DIAGNOSIS — Z5181 Encounter for therapeutic drug level monitoring: Secondary | ICD-10-CM

## 2018-09-03 LAB — HEPATIC FUNCTION PANEL
ALBUMIN: 4.4 g/dL (ref 3.5–5.2)
ALK PHOS: 50 U/L (ref 39–117)
ALT: 82 U/L — ABNORMAL HIGH (ref 0–53)
AST: 55 U/L — ABNORMAL HIGH (ref 0–37)
BILIRUBIN TOTAL: 0.5 mg/dL (ref 0.2–1.2)
Bilirubin, Direct: 0.1 mg/dL (ref 0.0–0.3)
Total Protein: 7.8 g/dL (ref 6.0–8.3)

## 2018-09-03 NOTE — Progress Notes (Signed)
IOV 03/05/2018  Chief Complaint  Patient presents with  . Consult    Referral per MD Helene Kelp, Dec 2018 had sinus infection and still has cough, hx of mild bronchiectasis, later was given symbicort which patient believes is making him more SOB.      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  He lives in [facility] and presents with his wife for evaluation of pulmonary fibrosis to the ILD clinic.  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  During this time he was not exposed to any metal dust but he was exposed to mold and damp environments intermittently but not always.  He denies any mold or mildew in the house.  He tells me that in December 2018 he had a sinus infection and since then has a lingering cough that never really went away.  It looks like from the primary care physician he saw dentist and from there he had imaging and this finally all resulted in a high-resolution CT chest January 21, 2018 that I personally visualized.  The official report is that it is indeterminate for UIP.  In my personal visualization there is bilateral bibasal symmetric disease with definite of craniocaudal gradient.  The septal thickening and also cylindrical bronchiectasis and peripheral bronchiec bronchiolectasis.  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  He does not have much of her shortness of breath but then he is morbidly obese and has balance issues and is quite limited.  He was seen by a local pulmonologist will give him a 2-5-year survival rate and therefore he is frustrated.  He was started on Symbicort which actually made things worse for him.  [facility] pulmonary interstitial lung disease questionnaire  Symptoms: Since dec 2019. Some sputjm +. Better since started. Insidious dyspnea x 4 months. Same since onset. No episodic  dyspnea. lLeve 4 dyspnea walking wiuth peers and level 5 walking up hill/stairs. Level 1 for dressing, Level 2 fo rshopping and mpwing lawn  ROS: chronic balance issues affecting mobiluity.  In association with this he has fatigue for the last 3 years.  He has dry mouth for the last several years associated with some dysphagia for the last 10 years heartburn and acid reflux for several years.  Daytime set sleepiness for the last several years but denies any oral ulcers or rash or weight loss or recurrent fever or arthralgia  Past medical history: Denies any asthma COPD or heart failure rheumatoid arthritis or scleroderma or systemic lupus erythematosus or polymyositis or Sjogren's but is positive for sleep apnea and hiatal hernia.  Negative for pulmonary hypertension diabetes thyroid disease stroke seizures hepatitis tuberculosis kidney disease blood clots heart disease other than murmur pleurisy  Family history of pulmonary disease: Positive for asthma in the mother but nobody with pulmonary fibrosis  Personal exposure history: He has never smoked cigarettes did smoke cigars for 3 years.  [place]  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  During this time there is intermittent mold  exposure  Pulmonary toxicity history denies a laundry list of pulmonary toxicity medications    OV 04/11/2018  Chief Complaint  Patient presents with  . Follow-up    PFT performed today.  Pt is still coughing with occ. white mucus and pt also states he believes SOB is worse and is becoming SOB more easily.    Follow-up interstitial lung disease work-up  Symptoms: In the interim no new symptoms.  He does admit to having  chronic arthralgia for many decades after sheetrock fell on him.  He also has joint stiffness especially in the hands but he does not know if it is early morning stiffness.  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 Lorin Picket.  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  His autoimmune and vascular does panel shows trace positivity for ANCA and cyclic citrulline peptide.    OV 06/04/2018   Chief Complaint  Patient presents with  . Follow-up    Pt states things have been about the same since last visit.   FU ILD - ccp and atypical p-anca trace positive May 2019. HP panel negative.  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 Lung function: Severity -FVC 63% and DLCO 58% showing moderate ILD. Rheum evaluation - 04/25/18 - no evidence of RA (Dr Trudie Reed)  Here with his wife to discuss test results. In the interim he saw Dr. Gavin Pound on 04/25/2018. She is of rheumatology. Clinically she did not find any evidence of systemic rheumatoid arthritis or vasculitis. Therefore he is here for decision making regarding his ILD. In the interim there are no new issues.  OV 07/16/2018  Subjective:  Patient ID: Stephen [name], male , DOB: 1951-09-04 , age 60 y.o. , MRN: [id number] , ADDRESS: [address] [place] [address]   07/16/2018 -   Chief Complaint  Patient presents with  . Follow-up    Pt started OFEV 8/11 and so far denies any complaints.    FU ILD - ccp and atypical p-anca trace positive May 2019. HP panel negative.  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 Lung function: Severity -FVC 63% and DLCO 58% showing moderate ILD. Rheum evaluation - 04/25/18 - no evidence of RA (Dr Trudie Reed). Dx of IPF given 06/04/18. STarted ofev 8/11/9    HPI Stephen [name] 31 y.o. - presents for follow-up of his idiopathic pulmonary fibrosis. Diagnoses given 06/04/2018. He is started on  Ofev 06/09/2018. He is here with his wife. He says he is tolerating the Ofev just fine. No GI issues. He gets support from the Child psychotherapist Clarene Critchley. He will have a liver function test today. He deferred flu shot because he will have it with his primary care physician. He told me that he is planning to join the pulmonary fibrosis foundation patient support group. Next meeting is 07/25/2018 Thursday 6 PM at [facility]. He is also willing to join research trials.He has mobility issues on account of his obesity and cane use. He says he cannot do a treadmill or walk long distances.   He has chronic tinnitus and apparently this is from working in [employer] base during [place] War although he was never deployed overseas. He started getting VA eligibility. He asked [place] to fill forms. ROS - per HPI  OV 09/03/2018  Subjective:  Patient ID:  Stephen [name], male , DOB: 1951-01-08 , age 43 y.o. , MRN: [id number] , ADDRESS: [address] [place] [address]   09/03/2018 -   Chief Complaint  Patient presents with  . Follow-up    Doing well at this time coughing up white mucus     FU ILD - ccp and atypical p-anca trace positive May 2019. HP panel negative.  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 Lung function: Severity -FVC 63% and DLCO 58% showing moderate ILD. Rheum evaluation - 04/25/18 - no evidence of RA (Dr Trudie Reed). Dx of IPF given 06/04/18. STarted ofev 8/11/9   HPI Stephen [name] 19 y.o. -returns for follow-up of his IPF.  He has been on nintedanib since mid August 2019.  He is here with his wife.  So far is tolerating the nintedanib just fine.  He says that his baseline constipation still continues and he needs both MiraLAX and Citrucel.  However what he notices that when he has relief of constipation it is diarrhea.  He says he is not able to come down on the laxative dose because otherwise the constipation returns.  He feels the 05 is not causing any diarrhea.  Is no  abdominal pain nausea vomiting.  In terms of shortness of breath is stable.  He tried to have pulmonary function test as follow-up like a few weeks ago but he started coughing and did not produce enough spirometry's.  All we have the symptoms which is stable.  He is up-to-date with his flu shot.  He is interested in research protocols.  He uses a cane normally.    Simple office walk 185 feet x  3 laps goal with forehead probe 03/05/2018  06/04/2018   09/03/2018   O2 used Room air Room air Room air  Number laps completed All 3 laops All 3 laps all3  Comments about pace Walks very slow due to baseline balance issue "40% of balance only due to remote trauma  nomral mod pace using cane  Resting Pulse Ox/HR 100% and 55/min 100% and 57/min 100% and 59/min  Final Pulse Ox/HR 97% and 78/min 97% and 77/mi 98% and 76/min  Desaturated </= 88% no no no  Desaturated <= 3% points yes yes no  Got Tachycardic >/= 90/min no no no  Symptoms at end of test No, denied dyspnea x none  Miscellaneous comments npne x x  Review of Systems    Results for Stephen [name], Stephen [name] (MRN [id number]) as of 07/16/2018 14:46  Ref. Range 04/11/2018 10:31 09/03/2018   FVC-Pre Latest Units: L 2.57   FVC-%Pred-Pre Latest Units: % 63    Results for Stephen [name], Stephen [name] (MRN [id number]) as of 07/16/2018 14:46  Ref. Range 04/11/2018 10:31 09/03/2018   DLCO unc Latest Units: ml/min/mmHg 16.68   DLCO unc % pred Latest Units: % 58       ROS - per HPI     has a past medical history of Allergy, Arthritis, GERD (gastroesophageal reflux disease), Heart murmur, Hiatal hernia, Hyperlipidemia, Hypertension, Sleep apnea, and Vertigo.   reports that he has never smoked. He has never used smokeless tobacco.  Past Surgical History:  Procedure Laterality Date  . CARDIAC CATHETERIZATION  03/22/2001  . LEG SURGERY Bilateral 04/2001    Allergies  Allergen Reactions  . Ciprofloxacin Other (See Comments)    colitis  . Codeine     Immunization  History  Administered Date(s) Administered  . Influenza, High Dose Seasonal PF 08/28/2017,  07/19/2018  . Pneumococcal Conjugate-13 08/25/2016  . Pneumococcal Polysaccharide-23 11/18/2007    Family History  Problem Relation Age of Onset  . Dementia Mother   . Heart attack Father   . Aneurysm Father   . Cancer Maternal Grandmother   . Aneurysm Maternal Grandmother      Current Outpatient Medications:  .  amitriptyline (ELAVIL) 75 MG tablet, Take 75 mg by mouth at bedtime., Disp: , Rfl:  .  amLODipine (NORVASC) 10 MG tablet, Take 10 mg by mouth daily., Disp: , Rfl:  .  aspirin EC 81 MG tablet, Take 81 mg by mouth daily., Disp: , Rfl:  .  calcium carbonate (OSCAL) 1500 (600 Ca) MG TABS tablet, Take by mouth 2 (two) times daily with a meal., Disp: , Rfl:  .  calcium carbonate (TUMS - DOSED IN MG ELEMENTAL CALCIUM) 500 MG chewable tablet, Chew 1 tablet by mouth daily., Disp: , Rfl:  .  carvedilol (COREG) 25 MG tablet, Take 25 mg by mouth 2 (two) times daily with a meal., Disp: , Rfl:  .  cholecalciferol (VITAMIN D) 1000 units tablet, Take 1,000 Units by mouth daily., Disp: , Rfl:  .  cloNIDine (CATAPRES) 0.3 MG tablet, Take 0.3 mg by mouth 2 (two) times daily., Disp: , Rfl:  .  dicyclomine (BENTYL) 10 MG capsule, Take 10 mg by mouth 4 (four) times daily -  before meals and at bedtime., Disp: , Rfl:  .  fenofibrate micronized (LOFIBRA) 134 MG capsule, Take 134 mg by mouth every other day., Disp: , Rfl:  .  lansoprazole (PREVACID) 30 MG capsule, Take 30 mg by mouth daily at 12 noon., Disp: , Rfl:  .  lovastatin (MEVACOR) 20 MG tablet, Take 20 mg by mouth every other day., Disp: , Rfl:  .  montelukast (SINGULAIR) 10 MG tablet, Take 10 mg by mouth at bedtime., Disp: , Rfl:  .  Multiple Vitamin (MULTIVITAMIN WITH MINERALS) TABS tablet, Take 1 tablet by mouth daily., Disp: , Rfl:  .  nabumetone (RELAFEN) 750 MG tablet, Take 750 mg by mouth 2 (two) times daily., Disp: , Rfl:  .  OFEV 150 MG CAPS,  , Disp: , Rfl:  .  polyethylene glycol (MIRALAX / GLYCOLAX) packet, Take 17 g by mouth daily., Disp: , Rfl:  .  spironolactone (ALDACTONE) 25 MG tablet, Take 25 mg by mouth 3 (three) times a week., Disp: , Rfl:  .  vitamin E 400 UNIT capsule, Take 400 Units by mouth daily., Disp: , Rfl:  .  lisinopril (PRINIVIL,ZESTRIL) 20 MG tablet, Take 1 tablet (20 mg total) by mouth daily., Disp: 30 tablet, Rfl: 3      Objective:   Vitals:   09/03/18 1109  BP: 122/84  Pulse: 67  SpO2: 97%  Weight: 240 lb (108.9 kg)  Height: _0  (1.676 m)    Estimated body mass index is 38.74 kg/m as calculated from the following:   Height as of this encounter: _1  (1.676 m).   Weight as of this encounter: 240 lb (108.9 kg).  _2 @  Filed Weights   09/03/18 1109  Weight: 240 lb (108.9 kg)     Physical Exam  General Appearance:    Alert, cooperative, no distress, appears stated age - yes , Deconditioned looking - no , OBESE  - yes, Sitting on Wheelchair -  no  Head:    Normocephalic, without obvious abnormality, atraumatic  Eyes:    PERRL, conjunctiva/corneas clear,  Ears:    Normal  TM's and external ear canals, both ears  Nose:   Nares normal, septum midline, mucosa normal, no drainage    or sinus tenderness. OXYGEN ON  - no . Patient is @ ra   Throat:   Lips, mucosa, and tongue normal; teeth and gums normal. Cyanosis on lips - no  Neck:   Supple, symmetrical, trachea midline, no adenopathy;    thyroid:  no enlargement/tenderness/nodules; no carotid   bruit or JVD  Back:     Symmetric, no curvature, ROM normal, no CVA tenderness  Lungs:     Distress - no , Wheeze no, Barrell Chest - no, Purse lip breathing - no, Crackles - velcro crackles at base   Chest Wall:    No tenderness or deformity.    Heart:    Regular rate and rhythm, S1 and S2 normal, no rub   or gallop, Murmur - no  Breast Exam:    NOT DONE  Abdomen:     Soft, non-tender, bowel sounds active all four quadrants,    no  masses, no organomegaly. Visceral obesity - yes  Genitalia:   NOT DONE  Rectal:   NOT DONE  Extremities:   Extremities - normal, Has Cane - yes, Clubbing - no, Edema - no  Pulses:   2+ and symmetric all extremities  Skin:   Stigmata of Connective Tissue Disease - no  Lymph nodes:   Cervical, supraclavicular, and axillary nodes normal  Psychiatric:  Neurologic:   Pleasant - yes, Anxious - no, Flat affect - no  CAm-ICU - neg, Alert and Oriented x 3 - yes, Moves all 4s - yes, Speech - normal, Cognition - intact           Assessment:       ICD-10-CM   1. IPF (idiopathic pulmonary fibrosis) ([place]) J84.112   2. Therapeutic drug monitoring Z51.81 Hepatic function panel       Plan:     Patient Instructions  IPF Encounter drug monitoring  Clinically IPF appears stable based on his symptoms He seems to be tolerating nintedanib well except for occasional overflow diarrhea from baseline constipation  Plan -Check liver function test today -Continue nintedanib 150 mg twice daily as before -Recheck liver function in 1 month and again in 2 months; you can do it in any lab but we preferred to maintain your safety that you do it at our lab - ok to wear mask in crowded placed  Followup -In 3 months do spirometry and DLCO -Return to ILD clinic in 3 months after his spirometry and DLCO  = At this visit we will do a simple [not 6-minute walk] test  -At this visit we will also recheck liver function test  -At this visit we will discuss about potential future pulmonary fibrosis research protocols as a care option   > 50% of this > 25 min visit spent in face to face counseling or coordination of care - by this undersigned MD - Dr Brand Males. This includes one or more of the following documented above: discussion of test results, diagnostic or treatment recommendations, prognosis, risks and benefits of management options, instructions, education, compliance or risk-factor  reduction   SIGNATURE    Dr. Brand Males, M.D., F.C.C.P,  Pulmonary and Critical Care Medicine Staff Physician, [place] Director - Interstitial Lung Disease  Program  Pulmonary [place] [facility] [place], [place], [address]  Pager: [phone], If no answer or between  15:00h - 7:00h: call [phone] Telephone: [phone]  11:32 AM 09/03/2018

## 2018-09-03 NOTE — Patient Instructions (Addendum)
IPF Encounter drug monitoring  Clinically IPF appears stable based on his symptoms He seems to be tolerating nintedanib well except for occasional overflow diarrhea from baseline constipation  Plan -Check liver function test today -Continue nintedanib 150 mg twice daily as before -Recheck liver function in 1 month and again in 2 months; you can do it in any lab but we preferred to maintain your safety that you do it at our lab - ok to wear mask in crowded placed  Followup -In 3 months do spirometry and DLCO -Return to ILD clinic in 3 months after his spirometry and DLCO  = At this visit we will do a simple [not 6-minute walk] test  -At this visit we will also recheck liver function test  -At this visit we will discuss about potential future pulmonary fibrosis research protocols as a care option

## 2018-09-04 ENCOUNTER — Telehealth: Payer: Self-pay | Admitting: Internal Medicine

## 2018-09-04 DIAGNOSIS — Z23 Encounter for immunization: Secondary | ICD-10-CM | POA: Diagnosis not present

## 2018-09-04 NOTE — Telephone Encounter (Signed)
lft is gong up with ofev.  He is on other medicatons suchas  Fenofibrate and lovastatin. These 3 are in combinmation driving up the statin. Could you please ask him who prescribed those? ANd if he can come off one of those for good?

## 2018-09-04 NOTE — Telephone Encounter (Signed)
Attempted to contact pt. I did not receive an answer. I have left a message for pt to return our call.  

## 2018-09-10 DIAGNOSIS — G4733 Obstructive sleep apnea (adult) (pediatric): Secondary | ICD-10-CM | POA: Diagnosis not present

## 2018-09-10 DIAGNOSIS — N3020 Other chronic cystitis without hematuria: Secondary | ICD-10-CM | POA: Diagnosis not present

## 2018-09-10 DIAGNOSIS — N312 Flaccid neuropathic bladder, not elsewhere classified: Secondary | ICD-10-CM | POA: Diagnosis not present

## 2018-09-10 DIAGNOSIS — N401 Enlarged prostate with lower urinary tract symptoms: Secondary | ICD-10-CM | POA: Diagnosis not present

## 2018-09-11 ENCOUNTER — Ambulatory Visit (INDEPENDENT_AMBULATORY_CARE_PROVIDER_SITE_OTHER): Payer: PPO | Admitting: Cardiology

## 2018-09-11 ENCOUNTER — Encounter: Payer: Self-pay | Admitting: Cardiology

## 2018-09-11 VITALS — BP 124/62 | HR 62 | Ht 66.0 in | Wt 243.0 lb

## 2018-09-11 DIAGNOSIS — I7090 Unspecified atherosclerosis: Secondary | ICD-10-CM | POA: Diagnosis not present

## 2018-09-11 DIAGNOSIS — J84112 Idiopathic pulmonary fibrosis: Secondary | ICD-10-CM

## 2018-09-11 DIAGNOSIS — I10 Essential (primary) hypertension: Secondary | ICD-10-CM

## 2018-09-11 DIAGNOSIS — E7800 Pure hypercholesterolemia, unspecified: Secondary | ICD-10-CM

## 2018-09-11 DIAGNOSIS — E663 Overweight: Secondary | ICD-10-CM

## 2018-09-11 HISTORY — DX: Idiopathic pulmonary fibrosis: J84.112

## 2018-09-11 HISTORY — DX: Overweight: E66.3

## 2018-09-11 NOTE — Patient Instructions (Signed)
Medication Instructions:  Your physician recommends that you continue on your current medications as directed. Please refer to the Current Medication list given to you today.  If you need a refill on your cardiac medications before your next appointment, please call your pharmacy.   Lab work: None  If you have labs (blood work) drawn today and your tests are completely normal, you will receive your results only by: Marland Kitchen MyChart Message (if you have MyChart) OR . A paper copy in the mail If you have any lab test that is abnormal or we need to change your treatment, we will call you to review the results.  Testing/Procedures: None  Follow-Up: At [facility], you and your health needs are our priority.  As part of our continuing mission to provide you with exceptional heart care, we have created designated Provider Care Teams.  These Care Teams include your primary Cardiologist (physician) and Advanced Practice Providers (APPs -  Physician Assistants and Nurse Practitioners) who all work together to provide you with the care you need, when you need it.  You will need a follow up appointment in 6 months.  Please call our office 2 months in advance to schedule this appointment.  You may see another member of our [employer] Provider Team in [place]: Jenne Campus, MD . Shirlee More, MD  Any Other Special Instructions Will Be Listed Below (If Applicable).

## 2018-09-11 NOTE — Progress Notes (Addendum)
Cardiology Office Note:    Date:  09/11/2018   ID:  Stephen [name], DOB 07/13/1951, MRN [id number]  PCP:  Ronita Hipps, MD  Cardiologist:  Jenean Lindau, MD   Referring MD: Ronita Hipps, MD    ASSESSMENT:    1. ASVD (arteriosclerotic vascular disease)   2. Essential hypertension   3. Hypercholesteremia   4. Overweight   5. Idiopathic pulmonary fibrosis ([place])    PLAN:    In order of problems listed above:  1. Secondary prevention stressed with the patient.  Importance of compliance with diet and medication stressed and he vocalized understanding.  His blood pressure is stable.  Diet was discussed with dyslipidemia and obesity and risks of obesity explained and he vocalized understanding. 2. Patient will be seen in follow-up appointment in 6 months or earlier if the patient has any concerns    Medication Adjustments/Labs and Tests Ordered: Current medicines are reviewed at length with the patient today.  Concerns regarding medicines are outlined above.  No orders of the defined types were placed in this encounter.  No orders of the defined types were placed in this encounter.    No chief complaint on file.    History of Present Illness:    Stephen [name] is a 67 y.o. male with history of coronary artery disease.  He denies any problems at this time.  He leads a sedentary lifestyle.  He is diagnosed recently with idiopathic pulmonary fibrosis and it has affected his quality of life.  He has shortness of breath on exertion which is steady and he tells me that his new medicine is helping him.  He is almost getting ready to be enrolled in a research study.  He is a very supportive wife who accompanies him for this visit.  At the time of my evaluation, the patient is alert awake oriented and in no distress.  Past Medical History:  Diagnosis Date  . Allergy   . Arthritis   . GERD (gastroesophageal reflux disease)   . Heart murmur   . Hiatal hernia   . Hyperlipidemia     . Hypertension   . Sleep apnea   . Vertigo     Past Surgical History:  Procedure Laterality Date  . CARDIAC CATHETERIZATION  03/22/2001  . LEG SURGERY Bilateral 04/2001    Current Medications: Current Meds  Medication Sig  . amitriptyline (ELAVIL) 75 MG tablet Take 75 mg by mouth at bedtime.  Marland Kitchen amLODipine (NORVASC) 10 MG tablet Take 10 mg by mouth daily.  Marland Kitchen aspirin EC 81 MG tablet Take 81 mg by mouth daily.  . calcium carbonate (OSCAL) 1500 (600 Ca) MG TABS tablet Take by mouth 2 (two) times daily with a meal.  . calcium carbonate (TUMS - DOSED IN MG ELEMENTAL CALCIUM) 500 MG chewable tablet Chew 1 tablet by mouth daily.  . carvedilol (COREG) 25 MG tablet Take 25 mg by mouth 2 (two) times daily with a meal.  . cholecalciferol (VITAMIN D) 1000 units tablet Take 1,000 Units by mouth daily.  . cloNIDine (CATAPRES) 0.3 MG tablet Take 0.3 mg by mouth 2 (two) times daily.  Marland Kitchen dicyclomine (BENTYL) 10 MG capsule Take 10 mg by mouth 4 (four) times daily -  before meals and at bedtime.  . fenofibrate micronized (LOFIBRA) 134 MG capsule Take 134 mg by mouth every other day.  . lansoprazole (PREVACID) 30 MG capsule Take 30 mg by mouth daily at 12 noon.  . lovastatin (MEVACOR)  20 MG tablet Take 20 mg by mouth every other day.  . montelukast (SINGULAIR) 10 MG tablet Take 10 mg by mouth at bedtime.  . Multiple Vitamin (MULTIVITAMIN WITH MINERALS) TABS tablet Take 1 tablet by mouth daily.  . nabumetone (RELAFEN) 750 MG tablet Take 750 mg by mouth 2 (two) times daily.  Marland Kitchen OFEV 150 MG CAPS   . polyethylene glycol (MIRALAX / GLYCOLAX) packet Take 17 g by mouth daily.  Marland Kitchen spironolactone (ALDACTONE) 25 MG tablet Take 25 mg by mouth 3 (three) times a week.  . vitamin E 400 UNIT capsule Take 400 Units by mouth daily.     Allergies:   Ciprofloxacin and Codeine   Social History   Socioeconomic History  . Marital status: Married    Spouse name: Not on file  . Number of children: Not on file  . Years  of education: Not on file  . Highest education level: Not on file  Occupational History  . Not on file  Social Needs  . Financial resource strain: Not on file  . Food insecurity:    Worry: Not on file    Inability: Not on file  . Transportation needs:    Medical: Not on file    Non-medical: Not on file  Tobacco Use  . Smoking status: Never Smoker  . Smokeless tobacco: Never Used  Substance and Sexual Activity  . Alcohol use: Not on file  . Drug use: Not on file  . Sexual activity: Not on file  Lifestyle  . Physical activity:    Days per week: Not on file    Minutes per session: Not on file  . Stress: Not on file  Relationships  . Social connections:    Talks on phone: Not on file    Gets together: Not on file    Attends religious service: Not on file    Active member of club or organization: Not on file    Attends meetings of clubs or organizations: Not on file    Relationship status: Not on file  Other Topics Concern  . Not on file  Social History Narrative  . Not on file     Family History: The patient's family history includes Aneurysm in his father and maternal grandmother; Cancer in his maternal grandmother; Dementia in his mother; Heart attack in his father.  ROS:   Please see the history of present illness.    All other systems reviewed and are negative.  EKGs/Labs/Other Studies Reviewed:    The following studies were reviewed today: I discussed my findings with the patient at extensive length.  I reviewed his lipids done in September and they were unremarkable.   Recent Labs: 03/01/2018: BUN 9; Creatinine, Ser 0.80; Potassium 4.6; Sodium 130 09/03/2018: ALT 82  Recent Lipid Panel No results found for: CHOL, TRIG, HDL, CHOLHDL, VLDL, LDLCALC, LDLDIRECT  Physical Exam:    VS:  BP 124/62 (BP Location: Right Arm, Patient Position: Sitting, Cuff Size: Normal)   Pulse 62   Ht _0  (1.676 m)   Wt 243 lb (110.2 kg)   SpO2 98%   BMI 39.22 kg/m     Wt  Readings from Last 3 Encounters:  09/11/18 243 lb (110.2 kg)  09/03/18 240 lb (108.9 kg)  07/16/18 240 lb 3.2 oz (109 kg)     GEN: Patient is in no acute distress HEENT: Normal NECK: No JVD; No carotid bruits LYMPHATICS: No lymphadenopathy CARDIAC: Hear sounds regular, 2/6 systolic murmur at the  apex. RESPIRATORY:  Clear to auscultation without rales, wheezing or rhonchi  ABDOMEN: Soft, non-tender, non-distended MUSCULOSKELETAL:  No edema; No deformity  SKIN: Warm and dry NEUROLOGIC:  Alert and oriented x 3 PSYCHIATRIC:  Normal affect   Signed, Jenean Lindau, MD  09/11/2018 9:45 AM    [place]

## 2018-09-23 ENCOUNTER — Other Ambulatory Visit: Payer: Self-pay | Admitting: Cardiology

## 2018-09-24 DIAGNOSIS — N390 Urinary tract infection, site not specified: Secondary | ICD-10-CM | POA: Diagnosis not present

## 2018-09-24 DIAGNOSIS — N400 Enlarged prostate without lower urinary tract symptoms: Secondary | ICD-10-CM | POA: Diagnosis not present

## 2018-09-24 DIAGNOSIS — N312 Flaccid neuropathic bladder, not elsewhere classified: Secondary | ICD-10-CM | POA: Diagnosis not present

## 2018-09-24 DIAGNOSIS — Z6836 Body mass index (BMI) 36.0-36.9, adult: Secondary | ICD-10-CM | POA: Diagnosis not present

## 2018-09-28 DIAGNOSIS — J84112 Idiopathic pulmonary fibrosis: Secondary | ICD-10-CM | POA: Diagnosis not present

## 2018-09-28 DIAGNOSIS — E785 Hyperlipidemia, unspecified: Secondary | ICD-10-CM | POA: Diagnosis not present

## 2018-09-28 DIAGNOSIS — N400 Enlarged prostate without lower urinary tract symptoms: Secondary | ICD-10-CM | POA: Diagnosis not present

## 2018-09-28 DIAGNOSIS — I10 Essential (primary) hypertension: Secondary | ICD-10-CM | POA: Diagnosis not present

## 2018-10-03 ENCOUNTER — Other Ambulatory Visit (INDEPENDENT_AMBULATORY_CARE_PROVIDER_SITE_OTHER): Payer: PPO

## 2018-10-03 DIAGNOSIS — Z5181 Encounter for therapeutic drug level monitoring: Secondary | ICD-10-CM

## 2018-10-03 LAB — HEPATIC FUNCTION PANEL
ALBUMIN: 4.3 g/dL (ref 3.5–5.2)
ALK PHOS: 53 U/L (ref 39–117)
ALT: 43 U/L (ref 0–53)
AST: 31 U/L (ref 0–37)
Bilirubin, Direct: 0.1 mg/dL (ref 0.0–0.3)
TOTAL PROTEIN: 7.8 g/dL (ref 6.0–8.3)
Total Bilirubin: 0.4 mg/dL (ref 0.2–1.2)

## 2018-10-10 DIAGNOSIS — G4733 Obstructive sleep apnea (adult) (pediatric): Secondary | ICD-10-CM | POA: Diagnosis not present

## 2018-10-25 DIAGNOSIS — R768 Other specified abnormal immunological findings in serum: Secondary | ICD-10-CM | POA: Diagnosis not present

## 2018-10-25 DIAGNOSIS — J849 Interstitial pulmonary disease, unspecified: Secondary | ICD-10-CM | POA: Diagnosis not present

## 2018-10-25 DIAGNOSIS — E669 Obesity, unspecified: Secondary | ICD-10-CM | POA: Diagnosis not present

## 2018-10-25 DIAGNOSIS — Z6837 Body mass index (BMI) 37.0-37.9, adult: Secondary | ICD-10-CM | POA: Diagnosis not present

## 2018-10-28 DIAGNOSIS — G4733 Obstructive sleep apnea (adult) (pediatric): Secondary | ICD-10-CM | POA: Diagnosis not present

## 2018-10-29 DIAGNOSIS — I10 Essential (primary) hypertension: Secondary | ICD-10-CM | POA: Diagnosis not present

## 2018-10-29 DIAGNOSIS — E785 Hyperlipidemia, unspecified: Secondary | ICD-10-CM | POA: Diagnosis not present

## 2018-11-04 ENCOUNTER — Other Ambulatory Visit (INDEPENDENT_AMBULATORY_CARE_PROVIDER_SITE_OTHER): Payer: PPO

## 2018-11-04 DIAGNOSIS — Z5181 Encounter for therapeutic drug level monitoring: Secondary | ICD-10-CM | POA: Diagnosis not present

## 2018-11-04 LAB — HEPATIC FUNCTION PANEL
ALT: 40 U/L (ref 0–53)
AST: 32 U/L (ref 0–37)
Albumin: 4.2 g/dL (ref 3.5–5.2)
Alkaline Phosphatase: 46 U/L (ref 39–117)
BILIRUBIN DIRECT: 0.1 mg/dL (ref 0.0–0.3)
BILIRUBIN TOTAL: 0.3 mg/dL (ref 0.2–1.2)
Total Protein: 7.3 g/dL (ref 6.0–8.3)

## 2018-11-18 ENCOUNTER — Telehealth: Payer: Self-pay | Admitting: Internal Medicine

## 2018-11-18 NOTE — Telephone Encounter (Signed)
LFT went up with Ofev but now x 2 are normal.   Plan  let him know is normal LFT Will see him per schedule in feb 2020  Thanks  MR  Results for KIEFER, OPHEIM (MRN [id number]) as of 11/18/2018 16:37  Ref. Range 06/04/2018 15:39 07/16/2018 15:43 09/03/2018 11:59 10/03/2018 14:48 11/04/2018 13:54  AST Latest Ref Range: 0 - 37 U/L 20 36 55 (H) 31 32  ALT Latest Ref Range: 0 - 53 U/L 22 51 82 (H) 43 40

## 2018-11-18 NOTE — Telephone Encounter (Signed)
Called and spoke with pt letting him know the results of labwork. Pt expressed understanding but stated to me the OFEV 119m has messed his stomach up and he states he pretty much has diarrhea almost the entire time while taking it.  Pt stated he discussed with pharmacist last time med was filled and they mentioned possibly seeing if he could be changed to the lower dose of OFEV. MR, please advise on this as pt is wanting to know if this could possibly be done. I stated that this could be discussed at next OV but stated to pt that I would go ahead and send it to you for you to see.

## 2018-11-19 NOTE — Telephone Encounter (Signed)
Do total ofev holiday for 7 days and then start at 181m once daily and staty there till he sees me and then can decide  He does need to stop miralax which can cause diarreha  And also does he really need prevacid, calcium carbonate (Tums) ?  And does he need fenofibrate?    Thanks   MR

## 2018-11-21 NOTE — Telephone Encounter (Signed)
Called and spoke with pt stating the info from MR. Pt stated he had received a call from the company to schedule next shipment of [place] and while he was on the phone with them, he stated they let him speak to the [facility] RN who stated for pt to add protein in the diet when about to take the OFEV to see if that would help with the diarrhea.  Pt stated he will try remedies stated by [employer] RN and if this does not work, he will stop the OFEV for 1 week and then go to taking it once daily until next appt. Nothing further needed.

## 2018-11-28 DIAGNOSIS — I10 Essential (primary) hypertension: Secondary | ICD-10-CM | POA: Diagnosis not present

## 2018-11-28 DIAGNOSIS — E119 Type 2 diabetes mellitus without complications: Secondary | ICD-10-CM | POA: Diagnosis not present

## 2018-11-28 DIAGNOSIS — E785 Hyperlipidemia, unspecified: Secondary | ICD-10-CM | POA: Diagnosis not present

## 2018-12-09 DIAGNOSIS — N312 Flaccid neuropathic bladder, not elsewhere classified: Secondary | ICD-10-CM | POA: Diagnosis not present

## 2018-12-09 DIAGNOSIS — R338 Other retention of urine: Secondary | ICD-10-CM | POA: Diagnosis not present

## 2018-12-09 DIAGNOSIS — N3090 Cystitis, unspecified without hematuria: Secondary | ICD-10-CM | POA: Diagnosis not present

## 2018-12-09 DIAGNOSIS — N3020 Other chronic cystitis without hematuria: Secondary | ICD-10-CM | POA: Diagnosis not present

## 2018-12-13 DIAGNOSIS — E119 Type 2 diabetes mellitus without complications: Secondary | ICD-10-CM | POA: Diagnosis not present

## 2018-12-13 DIAGNOSIS — E785 Hyperlipidemia, unspecified: Secondary | ICD-10-CM | POA: Diagnosis not present

## 2018-12-13 DIAGNOSIS — Z79899 Other long term (current) drug therapy: Secondary | ICD-10-CM | POA: Diagnosis not present

## 2018-12-17 ENCOUNTER — Encounter: Payer: Self-pay | Admitting: Internal Medicine

## 2018-12-17 ENCOUNTER — Ambulatory Visit (INDEPENDENT_AMBULATORY_CARE_PROVIDER_SITE_OTHER): Payer: PPO | Admitting: Internal Medicine

## 2018-12-17 VITALS — BP 126/78 | HR 64 | Ht 65.0 in | Wt 238.0 lb

## 2018-12-17 DIAGNOSIS — Z5181 Encounter for therapeutic drug level monitoring: Secondary | ICD-10-CM

## 2018-12-17 DIAGNOSIS — J84112 Idiopathic pulmonary fibrosis: Secondary | ICD-10-CM

## 2018-12-17 DIAGNOSIS — K521 Toxic gastroenteritis and colitis: Secondary | ICD-10-CM

## 2018-12-17 DIAGNOSIS — R053 Chronic cough: Secondary | ICD-10-CM

## 2018-12-17 DIAGNOSIS — R05 Cough: Secondary | ICD-10-CM

## 2018-12-17 LAB — PULMONARY FUNCTION TEST
FEF 25-75 PRE: 4.34 L/s
FEF2575-%PRED-PRE: 199 %
FEV1-%PRED-PRE: 80 %
FEV1-Pre: 2.22 L
FEV1FVC-%PRED-PRE: 123 %
FEV6-%Pred-Pre: 69 %
FEV6-Pre: 2.42 L
FEV6FVC-%Pred-Pre: 106 %
FVC-%Pred-Pre: 65 %
FVC-Pre: 2.42 L
PRE FEV1/FVC RATIO: 91 %
Pre FEV6/FVC Ratio: 100 %

## 2018-12-17 NOTE — Addendum Note (Signed)
Addended by: Suzzanne Cloud E on: 12/17/2018 03:51 PM   Modules accepted: Orders

## 2018-12-17 NOTE — Progress Notes (Signed)
Patient attempted to completed pre Stephen [name] and DLCO today. Was unable to complete the DLCO, and only able to complete 2 out of 3 approved tests for pre spiro due to severe coughing. Pt asked to stop the test as he could not stop coughing.

## 2018-12-17 NOTE — Addendum Note (Signed)
Addended by: Nena Polio on: 12/17/2018 03:34 PM   Modules accepted: Orders

## 2018-12-17 NOTE — Patient Instructions (Addendum)
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   Followup 4-6 weeks do HRCT supine and prone; will be 1 year followup for IPF 4-6 weeks ILD clinic/30 min slot

## 2018-12-17 NOTE — Progress Notes (Signed)
IOV 03/05/2018  Chief Complaint  Patient presents with  . Consult    Referral per MD Helene Kelp, Dec 2018 had sinus infection and still has cough, hx of mild bronchiectasis, later was given symbicort which patient believes is making him more SOB.      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  He lives in [facility] and presents with his wife for evaluation of pulmonary fibrosis to the ILD clinic.  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  During this time he was not exposed to any metal dust but he was exposed to mold and damp environments intermittently but not always.  He denies any mold or mildew in the house.  He tells me that in December 2018 he had a sinus infection and since then has a lingering cough that never really went away.  It looks like from the primary care physician he saw dentist and from there he had imaging and this finally all resulted in a high-resolution CT chest January 21, 2018 that I personally visualized.  The official report is that it is indeterminate for UIP.  In my personal visualization there is bilateral bibasal symmetric disease with definite of craniocaudal gradient.  The septal thickening and also cylindrical bronchiectasis and peripheral bronchiec bronchiolectasis.  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  He does not have much of her shortness of breath but then he is morbidly obese and has balance issues and is quite limited.  He was seen by a local pulmonologist will give him a 2-5-year survival rate and therefore he is frustrated.  He was started on Symbicort which actually made things worse for him.  [place] pulmonary interstitial lung disease questionnaire  Symptoms: Since dec 2019. Some sputjm +. Better since started. Insidious dyspnea x 4 months. Same since onset. No episodic  dyspnea. lLeve 4 dyspnea walking wiuth peers and level 5 walking up hill/stairs. Level 1 for dressing, Level 2 fo rshopping and mpwing lawn  ROS: chronic balance issues affecting mobiluity.  In association with this he has fatigue for the last 3 years.  He has dry mouth for the last several years associated with some dysphagia for the last 10 years heartburn and acid reflux for several years.  Daytime set sleepiness for the last several years but denies any oral ulcers or rash or weight loss or recurrent fever or arthralgia  Past medical history: Denies any asthma COPD or heart failure rheumatoid arthritis or scleroderma or systemic lupus erythematosus or polymyositis or Sjogren's but is positive for sleep apnea and hiatal hernia.  Negative for pulmonary hypertension diabetes thyroid disease stroke seizures hepatitis tuberculosis kidney disease blood clots heart disease other than murmur pleurisy  Family history of pulmonary disease: Positive for asthma in the mother but nobody with pulmonary fibrosis  Personal exposure history: He has never smoked cigarettes did smoke cigars for 3 years.  [place]  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  During this time there is intermittent mold exposure  Pulmonary toxicity history denies a laundry list of pulmonary toxicity medications    OV 04/11/2018  Chief Complaint  Patient presents with  . Follow-up    PFT performed today.  Pt is still coughing with occ. white mucus and pt also states he believes SOB is worse and is becoming SOB more easily.    Follow-up interstitial lung disease work-up  Symptoms: In the interim no new symptoms.  He does admit to having  chronic arthralgia for many decades after sheetrock fell on him.  He also has joint stiffness especially in the hands but he does not know if it is early morning stiffness.  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 Lorin Picket.  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  His autoimmune and vascular does panel shows trace positivity for ANCA and cyclic citrulline peptide.    OV 06/04/2018   Chief Complaint  Patient presents with  . Follow-up    Pt states things have been about the same since last visit.   FU ILD - ccp and atypical p-anca trace positive May 2019. HP panel negative.  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 Lung function: Severity -FVC 63% and DLCO 58% showing moderate ILD. Rheum evaluation - 04/25/18 - no evidence of RA (Dr Trudie Reed)  Here with his wife to discuss test results. In the interim he saw Dr. Gavin Pound on 04/25/2018. She is of rheumatology. Clinically she did not find any evidence of systemic rheumatoid arthritis or vasculitis. Therefore he is here for decision making regarding his ILD. In the interim there are no new issues.  OV 07/16/2018  Subjective:  Patient ID: Stephen [name], male , DOB: 1951-10-23 , age 68 y.o. , MRN: [id number] , ADDRESS: [address] [place] [address]   07/16/2018 -   Chief Complaint  Patient presents with  . Follow-up    Pt started OFEV 8/11 and so far denies any complaints.    FU ILD - ccp and atypical p-anca trace positive May 2019. HP panel negative.  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 Lung function: Severity -FVC 63% and DLCO 58% showing moderate ILD. Rheum evaluation - 04/25/18 - no evidence of RA (Dr Trudie Reed). Dx of IPF given 06/04/18. STarted ofev 8/11/9    HPI [name] 68 y.o. - presents for follow-up of his idiopathic pulmonary fibrosis. Diagnoses given 06/04/2018. He is started on  Ofev 06/09/2018. He is here with his wife. He says he is tolerating the Ofev just fine. No GI issues. He gets support from the Child psychotherapist Clarene Critchley. He will have a liver function test today. He deferred flu shot because he will have it with his primary care physician. He told me that he is planning to join the pulmonary fibrosis foundation patient support group. Next meeting is 07/25/2018 Thursday 6 PM at [facility]. He is also willing to join research trials.He has mobility issues on account of his obesity and cane use. He says he cannot do a treadmill or walk long distances.   He has chronic tinnitus and apparently this is from working in [employer] base during [place] War although he was never deployed overseas. He started getting VA eligibility. He asked [place] to fill forms. ROS - per HPI  OV 09/03/2018  Subjective:  Patient ID: Stephen [name]  Stephen [name], male , DOB: [birth year] , age 38 y.o. , MRN: [id number] , ADDRESS: [address] [place] [address]   09/03/2018 -   Chief Complaint  Patient presents with  . Follow-up    Doing well at this time coughing up white mucus     FU ILD - ccp and atypical p-anca trace positive May 2019. HP panel negative.  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 Lung function: Severity -FVC 63% and DLCO 58% showing moderate ILD. Rheum evaluation - 04/25/18 - no evidence of RA (Dr Trudie Reed). Dx of IPF given 06/04/18. STarted ofev 8/11/9   HPI Stephen [name] 89 y.o. -returns for follow-up of his IPF.  He has been on nintedanib since mid August 2019.  He is here with his wife.  So far is tolerating the nintedanib just fine.  He says that his baseline constipation still continues and he needs both MiraLAX and Citrucel.  However what he notices that when he has relief of constipation it is diarrhea.  He says he is not able to come down on the laxative dose because otherwise the constipation returns.  He feels the 05 is not causing any diarrhea.  Is no  abdominal pain nausea vomiting.  In terms of shortness of breath is stable.  He tried to have pulmonary function test as follow-up like a few weeks ago but he started coughing and did not produce enough spirometry's.  All we have the symptoms which is stable.  He is up-to-date with his flu shot.  He is interested in research protocols.  He uses a cane normally.      OV 12/17/2018  Subjective:  Patient ID: Stephen [name], male , DOB: 02-Nov-1950 , age 72 y.o. , MRN: [id number] , ADDRESS: [address] [place] [address]   12/17/2018 -   Chief Complaint  Patient presents with  . Follow-up    PFT performed today but pt was unable to perform DLCO due to coughing. Pt states SOB is about the same. Denies any CP or chest tightness.    FU ILD - ccp and atypical p-anca- ALL trace positive May 2019. HP panel negative.  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 Lung function: Severity -FVC 63% and DLCO 58% showing moderate ILD. Rheum evaluation - 04/25/18 - no evidence of RA (Dr Trudie Reed). Dx of IPF given 06/04/18. STarted ofev 8/11/9   HPI CHASTON BRADBURN 68 y.o. -IPF follow-up.  He presents with his wife as usual.  He tells me that starting January 2020 nintedanib started causing significant diarrhea.  By the third week of January 2020 he reduced himself to 1 tablet once daily at 150 mg.  With this there is no diarrhea.  He says that at the full dose he would take Imodium for diarrhea and then he would get constipated for several days.  He would follow this with a laxative and then he would have explosive diarrhea.  He said the symptoms became unmanageable.  In terms of his IPF itself he is stable.  His other main issues worsening chronic cough.  He has had chronic cough for b for several years predating IPF diagnosis.  He has been on ACE inhibitor's but it appears that lisinopril got rotated to benazepril and then to losartan but the cough never improved.  So he got switch back to benazepril.  He says that his  primary care team is aware that all these agents can cause cough  but the emphasis was on controlling the blood pressure.  He says the cough is severe.  Mostly in the daytime very rarely at night.  There is a laryngeal hoarse quality to his cough.  Noticed results on fish oil and carvedilol.  We discussed about neuropathic treatment for cough but he is already on amitriptyline    SYMPTOM SCALE - ILD 12/17/2018   O2 use RA  Shortness of Breath 0 -> 5 scale with 5 being worst (score 6 If unable to do)  At rest 1  Simple tasks - showers, clothes change, eating, shaving 3  Household (dishes, doing bed, laundry) x  Shopping 3  Walking level at own pace 1  Walking keeping up with others of same age 37  Walking up Stairs 3  Walking up Hill 5  Total (40 - 48) Dyspnea Score 19  How bad is your cough? 5  How bad is your fatigue 3       Results for ABRIEL, GEESEY (MRN [id number]) as of 12/17/2018 14:44  Ref. Range 04/11/2018 10:31 10/03/2018 14:46 12/17/2018   FVC-Pre Latest Units: L 2.57 2.42 2.42  FVC-%Pred-Pre Latest Units: % 63 65 65%      Simple office walk 185 feet x  3 laps goal with forehead probe 03/05/2018  06/04/2018   09/03/2018  12/17/2018   O2 used Room air Room air Room air Room air  Number laps completed All 3 laops All 3 laps all3 3 x 185  Comments about pace Walks very slow due to baseline balance issue "40% of balance only due to remote trauma  nomral mod pace using cane Slow with cane  Resting Pulse Ox/HR 100% and 55/min 100% and 57/min 100% and 59/min 100% and 61/min  Final Pulse Ox/HR 97% and 78/min 97% and 77/mi 98% and 76/min 95% and 91/min  Desaturated </= 88% no no no no  Desaturated <= 3% points yes yes no Yes, 5 points  Got Tachycardic >/= 90/min no no no yes  Symptoms at end of test No, denied dyspnea x none none  Miscellaneous comments npne x x   Review of Systems ROS - per HPI     has a past medical history of Allergy, Arthritis, GERD (gastroesophageal  reflux disease), Heart murmur, Hiatal hernia, Hyperlipidemia, Hypertension, Sleep apnea, and Vertigo.   reports that he has never smoked. He has never used smokeless tobacco.  Past Surgical History:  Procedure Laterality Date  . CARDIAC CATHETERIZATION  03/22/2001  . LEG SURGERY Bilateral 04/2001    Allergies  Allergen Reactions  . Ciprofloxacin Other (See Comments)    colitis  . Codeine     Immunization History  Administered Date(s) Administered  . Influenza, High Dose Seasonal PF 08/28/2017, 07/19/2018  . Pneumococcal Conjugate-13 08/25/2016  . Pneumococcal Polysaccharide-23 11/18/2007    Family History  Problem Relation Age of Onset  . Dementia Mother   . Heart attack Father   . Aneurysm Father   . Cancer Maternal Grandmother   . Aneurysm Maternal Grandmother      Current Outpatient Medications:  .  amitriptyline (ELAVIL) 75 MG tablet, Take 75 mg by mouth at bedtime., Disp: , Rfl:  .  amLODipine (NORVASC) 10 MG tablet, Take 10 mg by mouth daily., Disp: , Rfl:  .  aspirin EC 81 MG tablet, Take 81 mg by mouth daily., Disp: , Rfl:  .  benazepril (LOTENSIN) 40 MG tablet, , Disp: , Rfl:  .  calcium carbonate (OSCAL) 1500 (600 Ca)  MG TABS tablet, Take by mouth 2 (two) times daily with a meal., Disp: , Rfl:  .  calcium carbonate (TUMS - DOSED IN MG ELEMENTAL CALCIUM) 500 MG chewable tablet, Chew 1 tablet by mouth daily., Disp: , Rfl:  .  carvedilol (COREG) 25 MG tablet, Take 25 mg by mouth 2 (two) times daily with a meal., Disp: , Rfl:  .  cholecalciferol (VITAMIN D) 1000 units tablet, Take 1,000 Units by mouth daily., Disp: , Rfl:  .  cloNIDine (CATAPRES) 0.3 MG tablet, Take 0.3 mg by mouth 2 (two) times daily., Disp: , Rfl:  .  dicyclomine (BENTYL) 10 MG capsule, Take 10 mg by mouth 4 (four) times daily -  before meals and at bedtime., Disp: , Rfl:  .  fenofibrate micronized (LOFIBRA) 134 MG capsule, Take 134 mg by mouth every other day., Disp: , Rfl:  .  lansoprazole  (PREVACID) 30 MG capsule, Take 30 mg by mouth daily at 12 noon., Disp: , Rfl:  .  lovastatin (MEVACOR) 20 MG tablet, Take 20 mg by mouth every other day., Disp: , Rfl:  .  Methylcellulose, Laxative, (CITRUCEL PO), Take 1 Scoop by mouth., Disp: , Rfl:  .  montelukast (SINGULAIR) 10 MG tablet, Take 10 mg by mouth at bedtime., Disp: , Rfl:  .  Multiple Vitamin (MULTIVITAMIN WITH MINERALS) TABS tablet, Take 1 tablet by mouth daily., Disp: , Rfl:  .  nabumetone (RELAFEN) 750 MG tablet, Take 750 mg by mouth 2 (two) times daily., Disp: , Rfl:  .  OFEV 150 MG CAPS, , Disp: , Rfl:  .  polyethylene glycol (MIRALAX / GLYCOLAX) packet, Take 17 g by mouth daily., Disp: , Rfl:  .  Probiotic Product (PROBIOTIC DAILY PO), Take 1 capsule by mouth daily., Disp: , Rfl:  .  sodium chloride 1 g tablet, Take 1 g by mouth 3 (three) times daily., Disp: , Rfl:  .  spironolactone (ALDACTONE) 25 MG tablet, Take 25 mg by mouth 3 (three) times a week., Disp: , Rfl:  .  UNABLE TO FIND, Take 1 capsule by mouth 3 (three) times daily. Med Name: blood sugar harmony, Disp: , Rfl:  .  UNABLE TO FIND, Take 1 capsule by mouth 3 (three) times daily. Med Name: Clear lungs, Disp: , Rfl:  .  vitamin B-12 (CYANOCOBALAMIN) 1000 MCG tablet, Take 1,000 mcg by mouth daily., Disp: , Rfl:  .  vitamin E 400 UNIT capsule, Take 400 Units by mouth daily., Disp: , Rfl:       Objective:   Vitals:   12/17/18 1432  BP: 126/78  Pulse: 64  SpO2: 96%  Weight: 238 lb (108 kg)  Height: _0  (1.651 m)    Estimated body mass index is 39.61 kg/m as calculated from the following:   Height as of this encounter: _1  (1.651 m).   Weight as of this encounter: 238 lb (108 kg).  _2 @  [employer]   12/17/18 1432  Weight: 238 lb (108 kg)     Physical Exam  General Appearance:    Alert, cooperative, no distress, appears stated age - older , Deconditioned looking - no , OBESE  - yes, Sitting on Wheelchair -  no  Head:     Normocephalic, without obvious abnormality, atraumatic  Eyes:    PERRL, conjunctiva/corneas clear,  Ears:    Normal TM's and external ear canals, both ears  Nose:   Nares normal, septum midline, mucosa normal, no drainage    or  sinus tenderness. OXYGEN ON  - no . Patient is @ ra   Throat:   Lips, mucosa, and tongue normal; teeth and gums normal. Cyanosis on lips - no  Neck:   Supple, symmetrical, trachea midline, no adenopathy;    thyroid:  no enlargement/tenderness/nodules; no carotid   bruit or JVD  Back:     Symmetric, no curvature, ROM normal, no CVA tenderness  Lungs:     Distress - no , Wheeze no, Barrell Chest - no, Purse lip breathing - no, Crackles - yes atbase   Chest Wall:    No tenderness or deformity.    Heart:    Regular rate and rhythm, S1 and S2 normal, no rub   or gallop, Murmur - n  Breast Exam:    NOT DONE  Abdomen:     Soft, non-tender, bowel sounds active all four quadrants,    no masses, no organomegaly. Visceral obesity - yes  Genitalia:   NOT DONE  Rectal:   NOT DONE  Extremities:   Extremities - normal, Has Cane - YES, Clubbing - n, Edema - no  Pulses:   2+ and symmetric all extremities  Skin:   Stigmata of Connective Tissue Disease - no  Lymph nodes:   Cervical, supraclavicular, and axillary nodes normal  Psychiatric:  Neurologic:   Pleasant - yes, Anxious - no, Flat affect - no  CAm-ICU - neg, Alert and Oriented x 3 - yes, Moves all 4s - yes, Speech - normal, Cognition - intact           Assessment:       ICD-10-CM   1. IPF (idiopathic pulmonary fibrosis) (HCC) J84.112 Hepatic function panel  2. Therapeutic drug monitoring Z51.81 Hepatic function panel  3. Diarrhea due to drug K52.1   4. Chronic cough R05        Plan:     Patient Instructions  IPF (idiopathic pulmonary fibrosis) ([place]) -  - clinically likely stable - today is mostly symtpom management  Therapeutic drug monitoring -   Plan: Hepatic function panel on ofev  Diarrhea due to  drug Ofev  - stop ofev for rest of feruary  2020 - start ofev 160m once daily December 29, 2018 and then on January 05, 2019 increase ofev to 1510mtwice daily  - take with food  - take immodium on scheduled basis once a week - pick the most convenient day for this (eg Sunday)     Chronic cough - due to IPF, fish oil, and benazepril; made worse by cough neuropathy - stop fish oil - ask pharmacist to change your benazepril to benicar - and ask pharmacist to use lopressor instead of carevedilol   Followup 4-6 weeks do HRCT supine and prone; will be 1 year followup for IPF 4-6 weeks ILD clinic/30 min slot   > 50% of this > 25 min visit spent in face to face counseling or coordination of care - by this undersigned MD - Dr MuBrand MalesThis includes one or more of the following documented above: discussion of test results, diagnostic or treatment recommendations, prognosis, risks and benefits of management options, instructions, education, compliance or risk-factor reduction    SIGNATURE    Dr. MuBrand MalesM.D., F.C.C.P,  Pulmonary and Critical Care Medicine Staff Physician, Co[place]la[place]ownirector - Interstitial Lung Disease  Program  Pulmonary Fi[id number]: [phone] no answer or between  15:00h - 7:00h: call [phone] Telephone: [phone]  3:21 PM 12/17/2018

## 2018-12-18 LAB — HEPATIC FUNCTION PANEL
ALT: 32 U/L (ref 0–53)
AST: 32 U/L (ref 0–37)
Albumin: 4.2 g/dL (ref 3.5–5.2)
Alkaline Phosphatase: 43 U/L (ref 39–117)
Bilirubin, Direct: 0.1 mg/dL (ref 0.0–0.3)
Total Bilirubin: 0.4 mg/dL (ref 0.2–1.2)
Total Protein: 7.7 g/dL (ref 6.0–8.3)

## 2018-12-23 ENCOUNTER — Telehealth: Payer: Self-pay | Admitting: Internal Medicine

## 2018-12-23 NOTE — Telephone Encounter (Signed)
Spoke with Morey Hummingbird, she wanted to clarify the medications that MR advised pt to switch to. I read her the plan notes and she verbalized understanding.    Chronic cough - due to IPF, fish oil, and benazepril; made worse by cough neuropathy - stop fish oil - ask pharmacist to change your benazepril to benicar - and ask pharmacist to use lopressor instead of carevedilol -Hold off on gabapentin for neurogenic cough because you are already on amitriptyline for chronic pain.   Followup 4-6 weeks do HRCT supine and prone; will be 1 year followup for IPF 4-6 weeks ILD clinic/30 min slot

## 2018-12-28 DIAGNOSIS — I10 Essential (primary) hypertension: Secondary | ICD-10-CM | POA: Diagnosis not present

## 2018-12-28 DIAGNOSIS — E785 Hyperlipidemia, unspecified: Secondary | ICD-10-CM | POA: Diagnosis not present

## 2018-12-31 ENCOUNTER — Ambulatory Visit (INDEPENDENT_AMBULATORY_CARE_PROVIDER_SITE_OTHER)
Admission: RE | Admit: 2018-12-31 | Discharge: 2018-12-31 | Disposition: A | Discharge status: Home / Self Care | Payer: PPO | Source: Ambulatory Visit | Attending: Internal Medicine | Admitting: Internal Medicine

## 2018-12-31 DIAGNOSIS — J84112 Idiopathic pulmonary fibrosis: Secondary | ICD-10-CM

## 2018-12-31 DIAGNOSIS — J849 Interstitial pulmonary disease, unspecified: Secondary | ICD-10-CM | POA: Diagnosis not present

## 2019-01-06 ENCOUNTER — Other Ambulatory Visit: Payer: Self-pay | Admitting: *Deleted

## 2019-01-06 DIAGNOSIS — N312 Flaccid neuropathic bladder, not elsewhere classified: Secondary | ICD-10-CM | POA: Diagnosis not present

## 2019-01-06 DIAGNOSIS — N35912 Unspecified bulbous urethral stricture, male: Secondary | ICD-10-CM | POA: Diagnosis not present

## 2019-01-06 DIAGNOSIS — J84112 Idiopathic pulmonary fibrosis: Secondary | ICD-10-CM

## 2019-01-06 DIAGNOSIS — N3020 Other chronic cystitis without hematuria: Secondary | ICD-10-CM | POA: Diagnosis not present

## 2019-01-13 DIAGNOSIS — N312 Flaccid neuropathic bladder, not elsewhere classified: Secondary | ICD-10-CM | POA: Diagnosis not present

## 2019-01-13 DIAGNOSIS — N3020 Other chronic cystitis without hematuria: Secondary | ICD-10-CM | POA: Diagnosis not present

## 2019-01-20 DIAGNOSIS — N312 Flaccid neuropathic bladder, not elsewhere classified: Secondary | ICD-10-CM | POA: Diagnosis not present

## 2019-01-20 DIAGNOSIS — N3020 Other chronic cystitis without hematuria: Secondary | ICD-10-CM | POA: Diagnosis not present

## 2019-01-28 ENCOUNTER — Ambulatory Visit: Payer: PPO | Admitting: Internal Medicine

## 2019-01-28 DIAGNOSIS — E785 Hyperlipidemia, unspecified: Secondary | ICD-10-CM | POA: Diagnosis not present

## 2019-01-28 DIAGNOSIS — I10 Essential (primary) hypertension: Secondary | ICD-10-CM | POA: Diagnosis not present

## 2019-02-03 DIAGNOSIS — N318 Other neuromuscular dysfunction of bladder: Secondary | ICD-10-CM | POA: Diagnosis not present

## 2019-02-03 DIAGNOSIS — I4819 Other persistent atrial fibrillation: Secondary | ICD-10-CM | POA: Diagnosis not present

## 2019-02-03 DIAGNOSIS — N35010 Post-traumatic urethral stricture, male, meatal: Secondary | ICD-10-CM | POA: Diagnosis not present

## 2019-02-03 DIAGNOSIS — N3020 Other chronic cystitis without hematuria: Secondary | ICD-10-CM | POA: Diagnosis not present

## 2019-02-03 DIAGNOSIS — J3089 Other allergic rhinitis: Secondary | ICD-10-CM | POA: Diagnosis not present

## 2019-02-03 DIAGNOSIS — N3000 Acute cystitis without hematuria: Secondary | ICD-10-CM | POA: Diagnosis not present

## 2019-02-03 DIAGNOSIS — J301 Allergic rhinitis due to pollen: Secondary | ICD-10-CM | POA: Diagnosis not present

## 2019-02-27 DIAGNOSIS — I10 Essential (primary) hypertension: Secondary | ICD-10-CM | POA: Diagnosis not present

## 2019-02-27 DIAGNOSIS — E785 Hyperlipidemia, unspecified: Secondary | ICD-10-CM | POA: Diagnosis not present

## 2019-03-03 DIAGNOSIS — N312 Flaccid neuropathic bladder, not elsewhere classified: Secondary | ICD-10-CM | POA: Diagnosis not present

## 2019-03-12 DIAGNOSIS — Z0000 Encounter for general adult medical examination without abnormal findings: Secondary | ICD-10-CM | POA: Diagnosis not present

## 2019-03-12 DIAGNOSIS — Z1331 Encounter for screening for depression: Secondary | ICD-10-CM | POA: Diagnosis not present

## 2019-03-12 DIAGNOSIS — E7800 Pure hypercholesterolemia, unspecified: Secondary | ICD-10-CM | POA: Diagnosis not present

## 2019-03-12 DIAGNOSIS — Z6837 Body mass index (BMI) 37.0-37.9, adult: Secondary | ICD-10-CM | POA: Diagnosis not present

## 2019-03-12 DIAGNOSIS — I10 Essential (primary) hypertension: Secondary | ICD-10-CM | POA: Diagnosis not present

## 2019-03-12 DIAGNOSIS — E119 Type 2 diabetes mellitus without complications: Secondary | ICD-10-CM | POA: Diagnosis not present

## 2019-03-12 DIAGNOSIS — Z1339 Encounter for screening examination for other mental health and behavioral disorders: Secondary | ICD-10-CM | POA: Diagnosis not present

## 2019-03-28 DIAGNOSIS — G4733 Obstructive sleep apnea (adult) (pediatric): Secondary | ICD-10-CM | POA: Diagnosis not present

## 2019-03-29 DIAGNOSIS — E785 Hyperlipidemia, unspecified: Secondary | ICD-10-CM | POA: Diagnosis not present

## 2019-03-29 DIAGNOSIS — I10 Essential (primary) hypertension: Secondary | ICD-10-CM | POA: Diagnosis not present

## 2019-04-24 ENCOUNTER — Telehealth: Payer: Self-pay | Admitting: Internal Medicine

## 2019-04-24 NOTE — Telephone Encounter (Signed)
Called [employer] at [phone] to see if pt had patient assistance from them for his OFEV.   Spoke with Buddy to see if pt had received any assistance from [facility] and per Buddy, pt is not a patient of theirs.  Called and spoke with pt letting him know that we called [employer] and was told by them that the grant was not initiated through them and I told pt that I called [employer] to see if pt might have received any assistance through them and we were told that pt was not a pt of theirs.  Stated to pt he might want to call to see if he might be able to get anywhere and pt stated he called [employer] yesterday and when he called, they did have all of his information.  Stated to pt that I would try to call to see if I could get anywhere and pt verbalized understanding.  [place] and spoke with Judson Roch stating that pt's grant was about to run out 05/06/2019 and I was trying to see what needed to be done to get his grant renewed. Per Judson Roch, the pulmonary fibrosis is currently closed due to lack of funding but she stated we could periodically check back or go online to [email or web address] or pt could periodically check as well. Judson Roch also stated that the [facility] [place] was currently open and we could check with them to see if we might be able to get pt enrolled. Judson Roch stated I could call [phone].  Called [facility] [place] and spoke with Janett Billow and told her what was currently going on with our pt. Stated to her that pt does have pulmonary fibrosis and we were trying to see if pt could be enrolled for a grant through them. Janett Billow stated to me that they are currently accepting pulmonary fibrosis patients to be enrolled for a grant. Per Janett Billow, pt could either call the number which I was provided and go through all the information to become enrolled or they could go online to [email or web address].  I called and spoke with pt's wife Delores  stating this info to her and she said they would give [facility] [place] a call to get pt enrolled to see if he will be able to receive a grant through them. Nothing further needed.

## 2019-04-24 NOTE — Telephone Encounter (Signed)
Call returned to patient, he states he is taking OFEV via a grant from [employer] His ID # is [id number]. Phone number [phone]. He states his grant runs out 05/06/2019. He is wanting to go ahead and renew the grant. Made patient aware I would get this information to MR nurse and find out what is needed. I called the number provided and they stated the grant was not initiated through them.   Raquel Sarna please advise what we need to do for this patients grant renewal.

## 2019-04-29 DIAGNOSIS — E785 Hyperlipidemia, unspecified: Secondary | ICD-10-CM | POA: Diagnosis not present

## 2019-04-29 DIAGNOSIS — I10 Essential (primary) hypertension: Secondary | ICD-10-CM | POA: Diagnosis not present

## 2019-06-02 DIAGNOSIS — N401 Enlarged prostate with lower urinary tract symptoms: Secondary | ICD-10-CM | POA: Diagnosis not present

## 2019-06-02 DIAGNOSIS — N3090 Cystitis, unspecified without hematuria: Secondary | ICD-10-CM | POA: Diagnosis not present

## 2019-06-02 DIAGNOSIS — N35912 Unspecified bulbous urethral stricture, male: Secondary | ICD-10-CM | POA: Diagnosis not present

## 2019-06-02 DIAGNOSIS — N3020 Other chronic cystitis without hematuria: Secondary | ICD-10-CM | POA: Diagnosis not present

## 2019-06-02 DIAGNOSIS — N312 Flaccid neuropathic bladder, not elsewhere classified: Secondary | ICD-10-CM | POA: Diagnosis not present

## 2019-06-03 ENCOUNTER — Telehealth: Payer: Self-pay | Admitting: Internal Medicine

## 2019-06-03 NOTE — Telephone Encounter (Signed)
Called and spoke with pt letting him know that Stephen [name] was okay with wife coming with him to the appt. Stated to pt that both of them would need to wear a mask, both would be asked covid screening questions, and both would have temp checked at door. Pt verbalized understanding. Nothing further needed.

## 2019-06-03 NOTE — Telephone Encounter (Signed)
Yes fine with spouse. Needs to be screened for covid with questions and both afebrile at the door.   Aaron Edelman

## 2019-06-03 NOTE — Telephone Encounter (Signed)
With OFEV being a specialty medication, it does not come from pt's local pharmacy. The pharmacy that pt uses for the [facility] is [facility].   Attempted to call [facility] at [phone] to speak to a pharmacist to provide a verbal Rx for pt's OFEV but the hold time was going to be longer than usual. I will try to call back to provide them a verbal.   Pt's OFEV strength is 172m; directions for pt to take 1 capsule by mouth twice daily with food. It is a 30 day supply that is to be dispensed and a year supply for refills.

## 2019-06-03 NOTE — Telephone Encounter (Signed)
Called [facility] and spoke with Sharee Pimple, pharmacist and gave her a verbal of pt's OFEV Rx. They have this on file for next shipment as she said that they just shipped pt's med to him last Thursday 7/30 so all should be good for him.  Called and spoke with pt letting him know that I did call [facility] and renewed his OFEV Rx. Pt verbalized understanding. Pt mentioned wanting to get a f/u appt schedule but said he was told that MR still did not have a schedule. I stated to pt that MR is still currently working at the hospital but stated to him that we could get him scheduled for an OV with APP and pt verbalized understanding. Appt has been scheduled for pt with Aaron Edelman 8/6 at 11am.  Pt and wife wanted to know if it would be okay for her to come back with him for the appt. Pt does have issues with balance at times and does already walk with a walking stick but he also likes to have his wife there in case he has any problems with walking. Aaron Edelman, please advise if you are okay for pt's wife to come back with him or if pt should still come back for the appt by himself. Thanks!

## 2019-06-05 ENCOUNTER — Ambulatory Visit: Payer: PPO | Admitting: Pulmonary Disease

## 2019-06-05 ENCOUNTER — Telehealth: Payer: Self-pay | Admitting: Pulmonary Disease

## 2019-06-05 ENCOUNTER — Encounter: Payer: Self-pay | Admitting: Pulmonary Disease

## 2019-06-05 ENCOUNTER — Other Ambulatory Visit: Payer: Self-pay

## 2019-06-05 VITALS — BP 118/62 | HR 60 | Temp 97.8°F | Ht 66.0 in | Wt 240.0 lb

## 2019-06-05 DIAGNOSIS — Z5181 Encounter for therapeutic drug level monitoring: Secondary | ICD-10-CM | POA: Diagnosis not present

## 2019-06-05 DIAGNOSIS — J84112 Idiopathic pulmonary fibrosis: Secondary | ICD-10-CM | POA: Diagnosis not present

## 2019-06-05 LAB — CBC WITH DIFFERENTIAL/PLATELET
Basophils Absolute: 0.1 10*3/uL (ref 0.0–0.1)
Basophils Relative: 0.6 % (ref 0.0–3.0)
Eosinophils Absolute: 0.3 10*3/uL (ref 0.0–0.7)
Eosinophils Relative: 2.1 % (ref 0.0–5.0)
HCT: 44.9 % (ref 39.0–52.0)
Hemoglobin: 15.2 g/dL (ref 13.0–17.0)
Lymphocytes Relative: 23.5 % (ref 12.0–46.0)
Lymphs Abs: 3.2 10*3/uL (ref 0.7–4.0)
MCHC: 33.9 g/dL (ref 30.0–36.0)
MCV: 92.4 fl (ref 78.0–100.0)
Monocytes Absolute: 1.8 10*3/uL — ABNORMAL HIGH (ref 0.1–1.0)
Monocytes Relative: 13.4 % — ABNORMAL HIGH (ref 3.0–12.0)
Neutro Abs: 8.3 10*3/uL — ABNORMAL HIGH (ref 1.4–7.7)
Neutrophils Relative %: 60.4 % (ref 43.0–77.0)
Platelets: 301 10*3/uL (ref 150.0–400.0)
RBC: 4.86 Mil/uL (ref 4.22–5.81)
RDW: 14.5 % (ref 11.5–15.5)
WBC: 13.7 10*3/uL — ABNORMAL HIGH (ref 4.0–10.5)

## 2019-06-05 LAB — COMPREHENSIVE METABOLIC PANEL
ALT: 51 U/L (ref 0–53)
AST: 49 U/L — ABNORMAL HIGH (ref 0–37)
Albumin: 4.2 g/dL (ref 3.5–5.2)
Alkaline Phosphatase: 57 U/L (ref 39–117)
BUN: 7 mg/dL (ref 6–23)
CO2: 27 mEq/L (ref 19–32)
Calcium: 9.9 mg/dL (ref 8.4–10.5)
Chloride: 92 mEq/L — ABNORMAL LOW (ref 96–112)
Creatinine, Ser: 0.7 mg/dL (ref 0.40–1.50)
GFR: 112.09 mL/min (ref >60.00)
Glucose, Bld: 97 mg/dL (ref 70–99)
Potassium: 4 mEq/L (ref 3.5–5.1)
Sodium: 127 mEq/L — ABNORMAL LOW (ref 135–145)
Total Bilirubin: 0.4 mg/dL (ref 0.2–1.2)
Total Protein: 7.6 g/dL (ref 6.0–8.3)

## 2019-06-05 NOTE — Assessment & Plan Note (Addendum)
Assessment: Increased shortness of breath over the last 3 to 4 months in a patient with known IPF Could be due to deconditioning as well as immobility due to the COVID-19 pandemic March/2020 high-resolution CT chest did show progression of bibasilar fibrotic disease Tolerated walk in office today, 3 laps without oxygen desaturations Maintained on OFEV Patient has history of difficulty completing pulmonary function testing due to coughing  Discussion: Reviewed previous high-resolution CT chest imaging with patient and spouse today.  Also reviewed previous pulmonary function testing from October/2019 and December/2019.  With patient's history of struggling to complete pulmonary function testing as well as worsened symptoms we will proceed forward with high-resolution CT chest to further evaluate.  With patient's audibly wheezing as well as tolerating the walk quite well in office today I believe we can hold off on steroids.  Plan: Due to patient's acute symptoms as well as worsening CT in March/2020 will proceed forward with high-resolution CT chest now to further evaluate We will have patient have close follow-up with our office in 2 weeks with either Dr. Chase Caller or myself Based off of high-resolution CT chest could consider repeat spirometry with DLCO Continue 0FEV at this time Lab work today Needs Pneumovax 23 at next office visit

## 2019-06-05 NOTE — Progress Notes (Signed)
_0  ID: Stephen [name], male    DOB: 02/13/1951, 68 y.o.   MRN: [id number]  Chief Complaint  Patient presents with  . IPF (idiopathic pulmonary fibrosis)    Feels worse since last visit. Feels the same all day long. Worse when taking a shower.    Referring provider: Ronita Hipps, MD  HPI:  68 year old male never smoker followed in our office for IPF  PMH: Hypertension, obesity, sleep apnea Smoker/ Smoking History: Never smoker Maintenance: OFEV Pt of: Dr. Chase Caller  06/05/2019  - Visit   68 year old male never smoker followed in our office for IPF.  Patient is managed on 0FEV.  Last high-resolution CT chest was done in March/2020 that showed progression of the basilar fibrotic disease.  Last spirometry with DLCO done in December/2019 showed stability between October/2019 and December/2019.  Patient continues to be maintained on 0FEV and is tolerating this medication fairly well.  He reports he maybe has 2 episodes of diarrhea a week.  Patient presenting to our office today because he feels he has had worsening shortness of breath over the last 3 months.  Patient is concerned that his fibrosis may be worsening.  Patient reports that this is been a slow progression of increased shortness of breath.  He denies chest pain, lower extremity swelling, orthopnea.  Patient spouse admits that some of his shortness of breath could potentially be linked to the COVID-19 pandemic as he has not been as active and has been staying at home.   Tests:   12/31/2018-CT chest high-res- interval progression of basilar predominant fibrotic interstitial lung disease without frank honeycombing, categorized as probable UIP  10/03/2018-pulmonary function test-spirometry-FVC 2.42 (65% predicted), ratio 91, FEV1 2.22 (80% predicted) >>>Patient attempted to complete DLCO today was unable to complete DLCO due to severe coughing  SIX MIN WALK 06/05/2019 12/17/2018 09/03/2018 07/16/2018 06/04/2018 03/05/2018  Supplimental  Oxygen during Test? (L/min) _1  -  Tech Comments: The patient was able to maintain a steady walking pace with the use of his cane. The patient did not have to stop or sit down during the walk. Patient stated he is ok on level ground, but the difficulty comes when walking up steps or an incline. Pt walked at a slow pace with a cane completing all required laps denying any complaints. slow paced but no sob noted amb at slow steady pace Pt walked at a normal pace completing all required laps. Pt denied any complaints. Walked at a slow pace with cane, reports he only has 41% of his balance due to a trauma. Tolerated well and denies SOB.      03/05/2018- [place] pulmonary interstitial lung disease questionnaire  Symptoms: Since dec 2019. Some sputjm +. Better since started. Insidious dyspnea x 4 months. Same since onset. No episodic dyspnea. lLeve 4 dyspnea walking wiuth peers and level 5 walking up hill/stairs. Level 1 for dressing, Level 2 fo rshopping and mpwing lawn  ROS: chronic balance issues affecting mobiluity.  In association with this he has fatigue for the last 3 years.  He has dry mouth for the last several years associated with some dysphagia for the last 10 years heartburn and acid reflux for several years.  Daytime set sleepiness for the last several years but denies any oral ulcers or rash or weight loss or recurrent fever or arthralgia  Past medical history: Denies any asthma COPD or heart failure rheumatoid arthritis or scleroderma or systemic lupus erythematosus or polymyositis or Sjogren's but  is positive for sleep apnea and hiatal hernia.  Negative for pulmonary hypertension diabetes thyroid disease stroke seizures hepatitis tuberculosis kidney disease blood clots heart disease other than murmur pleurisy  Family history of pulmonary disease: Positive for asthma in the mother but nobody with pulmonary fibrosis  Personal exposure history: He has never smoked cigarettes did  smoke cigars for 3 years.  [place]  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  During this time there is intermittent mold exposure  Pulmonary toxicity history denies a laundry list of pulmonary toxicity medications  FENO:  No results found for: NITRICOXIDE  PFT: PFT Results Latest Ref Rng & Units 10/03/2018 08/16/2018 04/11/2018  FVC-Pre L 2.42 2.53 2.57  FVC-Predicted Pre % 65 67 63  Pre FEV1/FVC % % 91 89 89  FEV1-Pre L 2.22 2.26 2.29  FEV1-Predicted Pre % 80 81 76  DLCO UNC% % - - 58  DLCO COR %Predicted % - - 106    Imaging: No results found.    Specialty Problems      Pulmonary Problems   Sleep apnea   Idiopathic pulmonary fibrosis ([place])    12/31/2018-CT chest high-res- interval progression of basilar predominant fibrotic interstitial lung disease without frank honeycombing, categorized as probable UIP  10/03/2018-pulmonary function test-spirometry-FVC 2.42 (65% predicted), ratio 91, FEV1 2.22 (80% predicted) >>>Patient attempted to complete DLCO today was unable to complete DLCO due to severe coughing  03/05/2018- [place] pulmonary interstitial lung disease questionnaire  Symptoms: Since dec 2019. Some sputjm +. Better since started. Insidious dyspnea x 4 months. Same since onset. No episodic dyspnea. lLeve 4 dyspnea walking wiuth peers and level 5 walking up hill/stairs. Level 1 for dressing, Level 2 fo rshopping and mpwing lawn  ROS: chronic balance issues affecting mobiluity.  In association with this he has fatigue for the last 3 years.  He has dry mouth for the last several years associated with some dysphagia for the last 10 years heartburn and acid reflux for several years.   Daytime set sleepiness for the last several years but denies any oral ulcers or rash or weight loss or recurrent fever or arthralgia  Past medical history: Denies any asthma COPD or heart failure rheumatoid arthritis or scleroderma or systemic lupus erythematosus or polymyositis or Sjogren's but is positive for sleep apnea and hiatal hernia.  Negative for pulmonary hypertension diabetes thyroid disease stroke seizures hepatitis tuberculosis kidney disease blood clots heart disease other than murmur pleurisy  Family history of pulmonary disease: Positive for asthma in the mother but nobody with pulmonary fibrosis  Personal exposure history: He has never smoked cigarettes did smoke cigars for 3 years.  [place]  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  During this time there is intermittent mold exposure  Pulmonary toxicity history denies a laundry list of pulmonary toxicity medications         Allergies  Allergen Reactions  . Ciprofloxacin Other (See Comments)    colitis  . Codeine     Immunization History  Administered Date(s) Administered  . Influenza, High Dose Seasonal PF 08/28/2017, 07/19/2018  . Pneumococcal Conjugate-13 08/25/2016  . Pneumococcal Polysaccharide-23 11/18/2007   Needs Pneumovax 23 at next office visit  Past Medical History:  Diagnosis Date  . Allergy   . Arthritis   . GERD (gastroesophageal reflux disease)   . Heart murmur   . Hiatal hernia   . Hyperlipidemia   . Hypertension   . Sleep apnea   . Vertigo     Tobacco History: Social History   Tobacco Use  Smoking Status Never Smoker  Smokeless Tobacco Never Used   Counseling given: Not Answered   Continue to not smoke  Outpatient Encounter Medications as of 06/05/2019  Medication Sig  . amitriptyline (ELAVIL) 75 MG tablet Take 75 mg by mouth at bedtime.  Marland Kitchen amLODipine (NORVASC) 10 MG tablet Take 10 mg by mouth daily.  Marland Kitchen aspirin EC 81 MG tablet Take 81 mg by mouth daily.  . calcium carbonate (OSCAL) 1500 (600 Ca) MG TABS tablet Take by mouth 2 (two) times daily with a meal.  . calcium carbonate (TUMS - DOSED IN MG ELEMENTAL CALCIUM) 500 MG chewable tablet Chew 1 tablet by mouth daily.  . cholecalciferol (VITAMIN D) 1000 units tablet Take 1,000 Units by mouth daily.  . cloNIDine (CATAPRES) 0.3 MG tablet Take 0.3 mg by mouth daily after supper.   . dicyclomine (BENTYL) 10 MG capsule Take 10 mg by mouth 4 (four) times daily -  before meals and at bedtime.  . lansoprazole (PREVACID) 30 MG capsule Take 30 mg by mouth daily at 12 noon.  . Methylcellulose, Laxative, (CITRUCEL PO) Take 1 Scoop by mouth.  . metoprolol tartrate (LOPRESSOR) 50 MG tablet Take 1 tablet by mouth 2 (two) times daily.  . montelukast (SINGULAIR) 10 MG tablet Take 10 mg by mouth at bedtime.  . Multiple Vitamin (MULTIVITAMIN WITH MINERALS) TABS tablet Take 1 tablet by mouth daily.  . nabumetone (RELAFEN) 750 MG tablet Take 750 mg by mouth 2 (two) times daily.  Marland Kitchen OFEV 150 MG CAPS   . polyethylene glycol (MIRALAX / GLYCOLAX) packet Take 17 g by mouth daily.  . Probiotic Product (PROBIOTIC DAILY PO) Take 1 capsule by mouth daily.  . rosuvastatin (CRESTOR) 10 MG tablet Take 10 mg by mouth daily.  . sodium chloride 1 g tablet Take 1 g by mouth 3 (three) times daily.  Marland Kitchen spironolactone (ALDACTONE) 25 MG tablet Take 25 mg by mouth 3 (three) times a week.  . vitamin B-12 (CYANOCOBALAMIN) 1000 MCG tablet Take 1,000 mcg by mouth daily.  . vitamin E 400 UNIT capsule Take 400 Units by mouth daily.  Marland Kitchen olmesartan (BENICAR) 20 MG tablet Take 1 tablet by mouth daily.  Marland Kitchen UNABLE TO FIND Take 1 capsule by mouth 3 (three) times daily. Med  Name: blood sugar harmony  . UNABLE TO FIND Take 1 capsule by mouth 3 (three) times daily. Med Name: Clear lungs  . [DISCONTINUED] benazepril (LOTENSIN) 40 MG tablet   . [DISCONTINUED] carvedilol (COREG) 25 MG tablet Take 25 mg by mouth 2 (two) times daily with a meal.  . [DISCONTINUED] fenofibrate micronized (LOFIBRA) 134 MG capsule Take 134 mg by mouth every other day.  . [DISCONTINUED] lovastatin (MEVACOR) 20 MG tablet Take 20 mg by mouth every other day.   No facility-administered encounter medications on file as of 06/05/2019.  Review of Systems  Review of Systems  Constitutional: Positive for fatigue. Negative for activity change, chills, fever and unexpected weight change.  HENT: Negative for postnasal drip, rhinorrhea, sinus pressure, sinus pain and sore throat.   Eyes: Negative.   Respiratory: Positive for shortness of breath. Negative for cough and wheezing.   Cardiovascular: Negative for chest pain and palpitations.  Gastrointestinal: Positive for diarrhea (occasional with OFEV - 2x a week ). Negative for constipation, nausea and vomiting.  Endocrine: Negative.   Genitourinary: Negative.   Musculoskeletal: Negative.   Skin: Negative.   Neurological: Negative for dizziness and headaches.  Psychiatric/Behavioral: Negative.  Negative for dysphoric mood. The patient is not nervous/anxious.   All other systems reviewed and are negative.    Physical Exam  BP 118/62 (BP Location: Left Arm, Patient Position: Sitting, Cuff Size: Large)   Pulse 60   Temp 97.8 F (36.6 C)   Ht _0  (1.676 m)   Wt 240 lb (108.9 kg)   SpO2 97%   BMI 38.74 kg/m   Wt Readings from Last 5 Encounters:  06/05/19 240 lb (108.9 kg)  12/17/18 238 lb (108 kg)  09/11/18 243 lb (110.2 kg)  09/03/18 240 lb (108.9 kg)  07/16/18 240 lb 3.2 oz (109 kg)     Physical Exam Vitals signs and nursing note reviewed.  Constitutional:      General: He is not in acute distress.    Appearance: Normal  appearance. He is obese.  HENT:     Head: Normocephalic and atraumatic.     Right Ear: Hearing, tympanic membrane, ear canal and external ear normal.     Left Ear: Hearing, tympanic membrane, ear canal and external ear normal.     Nose: Nose normal. No mucosal edema or rhinorrhea.     Right Turbinates: Not enlarged.     Left Turbinates: Not enlarged.     Mouth/Throat:     Mouth: Mucous membranes are dry.     Pharynx: Oropharynx is clear. No oropharyngeal exudate.  Eyes:     Pupils: Pupils are equal, round, and reactive to light.  Neck:     Musculoskeletal: Normal range of motion.  Cardiovascular:     Rate and Rhythm: Normal rate and regular rhythm.     Pulses: Normal pulses.     Heart sounds: Normal heart sounds. No murmur.  Pulmonary:     Effort: Pulmonary effort is normal.     Breath sounds: Rales (Bibasilar Rales, right greater than left) present. No decreased breath sounds or wheezing.  Abdominal:     General: Bowel sounds are normal. There is no distension.     Palpations: Abdomen is soft.     Tenderness: There is no abdominal tenderness.     Comments: Truncal obesity  Musculoskeletal:     Right lower leg: Edema (Trace lower extremity swelling, patient with compression stockings applied today, patient reports this is his baseline) present.     Left lower leg: Edema (Trace lower extremity swelling visible today, compression stockings applied, patient reports this is baseline) present.  Lymphadenopathy:     Cervical: No cervical adenopathy.  Skin:    General: Skin is warm and dry.     Capillary Refill: Capillary refill takes less than 2 seconds.     Findings: No erythema or rash.  Neurological:     General: No focal deficit present.     Mental Status: He is alert and oriented to person, place, and time.  Motor: No weakness.     Coordination: Coordination normal.     Gait: Gait abnormal (Occasional falls at home, walks with cane, tolerated 3 laps walk in office).   Psychiatric:        Mood and Affect: Mood normal.        Behavior: Behavior normal. Behavior is cooperative.        Thought Content: Thought content normal.        Judgment: Judgment normal.      Lab Results:  CBC No results found for: WBC, RBC, HGB, HCT, PLT, MCV, MCH, MCHC, RDW, LYMPHSABS, MONOABS, EOSABS, BASOSABS  BMET    Component Value Date/Time   NA 130 (L) 03/01/2018 1133   K 4.6 03/01/2018 1133   CL 94 (L) 03/01/2018 1133   CO2 24 03/01/2018 1133   GLUCOSE 93 03/01/2018 1133   BUN 9 03/01/2018 1133   CREATININE 0.80 03/01/2018 1133   CALCIUM 9.5 03/01/2018 1133   GFRNONAA 93 03/01/2018 1133   GFRAA 108 03/01/2018 1133    BNP No results found for: BNP  ProBNP No results found for: PROBNP    Assessment & Plan:   Idiopathic pulmonary fibrosis (HCC) Assessment: Increased shortness of breath over the last 3 to 4 months in a patient with known IPF Could be due to deconditioning as well as immobility due to the COVID-19 pandemic March/2020 high-resolution CT chest did show progression of bibasilar fibrotic disease Tolerated walk in office today, 3 laps without oxygen desaturations Maintained on OFEV Patient has history of difficulty completing pulmonary function testing due to coughing  Discussion: Reviewed previous high-resolution CT chest imaging with patient and spouse today.  Also reviewed previous pulmonary function testing from October/2019 and December/2019.  With patient's history of struggling to complete pulmonary function testing as well as worsened symptoms we will proceed forward with high-resolution CT chest to further evaluate.  With patient's audibly wheezing as well as tolerating the walk quite well in office today I believe we can hold off on steroids.  Plan: Due to patient's acute symptoms as well as worsening CT in March/2020 will proceed forward with high-resolution CT chest now to further evaluate We will have patient have close follow-up  with our office in 2 weeks with either Dr. Chase Caller or myself Based off of high-resolution CT chest could consider repeat spirometry with DLCO Continue 0FEV at this time Lab work today Needs Pneumovax 23 at next office visit      Return in about 4 weeks (around 07/03/2019), or if symptoms worsen or fail to improve, for Follow up with Wyn Quaker FNP-C, After Chest CT, Follow up with Dr. Purnell Shoemaker.   Lauraine Rinne, NP 06/05/2019   This appointment was 42 minutes long with over 50% of the time in direct face-to-face patient care, assessment, plan of care, and follow-up.

## 2019-06-05 NOTE — Patient Instructions (Addendum)
Walk today   Labwork today   Continue OFEV   Order High Res CT Chest   Return in about 4 weeks (around 07/03/2019), or if symptoms worsen or fail to improve, for Follow up with Wyn Quaker FNP-C, After Chest CT, Follow up with Dr. Purnell Shoemaker.  Coronavirus (COVID-19) Are you at risk?  Are you at risk for the Coronavirus (COVID-19)?  To be considered HIGH RISK for Coronavirus (COVID-19), you have to meet the following criteria:  . Traveled to [place], [place], [place], [place] or [place]; or in the [place] to [place], [place], [place], or [place]; and have fever, cough, and shortness of breath within the last 2 weeks of travel OR . Been in close contact with a person diagnosed with COVID-19 within the last 2 weeks and have fever, cough, and shortness of breath . IF YOU DO NOT MEET THESE CRITERIA, YOU ARE CONSIDERED LOW RISK FOR COVID-19.  What to do if you are HIGH RISK for COVID-19?  Marland Kitchen If you are having a medical emergency, call 911. . Seek medical care right away. Before you go to a doctor's office, urgent care or emergency department, call ahead and tell them about your recent travel, contact with someone diagnosed with COVID-19, and your symptoms. You should receive instructions from your physician's office regarding next steps of care.  . When you arrive at healthcare provider, tell the healthcare staff immediately you have returned from visiting [place], [place], [place], [place] or [place]; or traveled in the [place] to [place], [place], [place], or [place]; in the last two weeks or you have been in close contact with a person diagnosed with COVID-19 in the last 2 weeks.   . Tell the health care staff about your symptoms: fever, cough and shortness of breath. . After you have been seen by a medical provider, you will be either: o Tested for (COVID-19) and discharged home on quarantine except to seek medical care if symptoms worsen, and asked to  - Stay home and  avoid contact with others until you get your results (4-5 days)  - Avoid travel on public transportation if possible (such as bus, train, or airplane) or o Sent to the Emergency Department by EMS for evaluation, COVID-19 testing, and possible admission depending on your condition and test results.  What to do if you are LOW RISK for COVID-19?  Reduce your risk of any infection by using the same precautions used for avoiding the common cold or flu:  Marland Kitchen Wash your hands often with soap and warm water for at least 20 seconds.  If soap and water are not readily available, use an alcohol-based hand sanitizer with at least 60% alcohol.  . If coughing or sneezing, cover your mouth and nose by coughing or sneezing into the elbow areas of your shirt or coat, into a tissue or into your sleeve (not your hands). . Avoid shaking hands with others and consider head nods or verbal greetings only. . Avoid touching your eyes, nose, or mouth with unwashed hands.  . Avoid close contact with people who are sick. . Avoid places or events with large numbers of people in one location, like concerts or sporting events. . Carefully consider travel plans you have or are making. . If you are planning any travel outside or inside the [place], visit the CDC's Travelers' Health webpage for the latest health notices. . If you have some symptoms but not all symptoms, continue to monitor  at home and seek medical attention if your symptoms worsen. . If you are having a medical emergency, call 911.   [place] / e-Visit: [email or web address]         [facility]: [facility]: [phone]                   [facility]: [phone]           It is flu season:   >>> Best ways to protect herself from the flu: Receive the yearly flu vaccine, practice good hand hygiene washing  with soap and also using hand sanitizer when available, eat a nutritious meals, get adequate rest, hydrate appropriately   Please contact the office if your symptoms worsen or you have concerns that you are not improving.   Thank you for choosing  [facility] for your healthcare, and for allowing [place] to partner with you on your healthcare journey. I am thankful to be able to provide care to you today.   Wyn Quaker FNP-C

## 2019-06-05 NOTE — Assessment & Plan Note (Deleted)
12/31/2018-CT chest high-res- interval progression of basilar predominant fibrotic interstitial lung disease without frank honeycombing, categorized as probable UIP  10/03/2018-pulmonary function test-spirometry-FVC 2.42 (65% predicted), ratio 91, FEV1 2.22 (80% predicted) >>>Patient attempted to complete DLCO today was unable to complete DLCO due to severe coughing  03/05/2018- [facility] pulmonary interstitial lung disease questionnaire  Symptoms: Since dec 2019. Some sputjm +. Better since started. Insidious dyspnea x 4 months. Same since onset. No episodic dyspnea. lLeve 4 dyspnea walking wiuth peers and level 5 walking up hill/stairs. Level 1 for dressing, Level 2 fo rshopping and mpwing lawn  ROS: chronic balance issues affecting mobiluity.  In association with this he has fatigue for the last 3 years.  He has dry mouth for the last several years associated with some dysphagia for the last 10 years heartburn and acid reflux for several years.  Daytime set sleepiness for the last several years but denies any oral ulcers or rash or weight loss or recurrent fever or arthralgia  Past medical history: Denies any asthma COPD or heart failure rheumatoid arthritis or scleroderma or systemic lupus erythematosus or polymyositis or Sjogren's but is positive for sleep apnea and hiatal hernia.  Negative for pulmonary hypertension diabetes thyroid disease stroke seizures hepatitis tuberculosis kidney disease blood clots heart disease other than murmur pleurisy  Family history of pulmonary disease: Positive for asthma in the mother but nobody with pulmonary fibrosis  Personal exposure history: He has never smoked cigarettes did smoke cigars for 3 years.  [place]  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  During this time there is intermittent mold exposure  Pulmonary toxicity history denies a laundry list of pulmonary toxicity medications

## 2019-06-05 NOTE — Telephone Encounter (Signed)
06/05/2019 1324  Dr. Chase Caller, please see the chart from the patient's office visit today.  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  Has had worsening shortness of breath over the last 3 months.  Tolerated walk in office today without any oxygen desaturations or tachycardia.  Patient struggles with completing pulmonary function testing in general so I went ahead and proceeded forward with ordering a HRCT.  HRCT in March/2020 showed progression of IPF.  Based off the high-resolution CT chest you believe the patient would be a candidate to transition to Esbriet as he is having progression while maintained on 0FEV?  Did not treat with prednisone today as patient was able to tolerate walk without desaturations as well as there is no audible wheezing on exam.  If you think you have any other suggestions regarding interventions or treatment options for him please feel free to let me know and I can reach out to the patient.  I will see the patient back in 2 weeks in office with either me or you after completing high resolution CT chest.  Stephen Quaker, FNP

## 2019-06-05 NOTE — Progress Notes (Signed)
Lab work has come back.    Patient showing low sodium levels.  Patient should follow-up with primary care regarding this to recheck.  I would recommend the patient increase daily salt intake.  Patient can also have a repeat blood work done at next office visit.  If patient starts to have acute changes in his mental status, or feeling lightheaded need to consider evaluation at emergency department.  Complete blood count shows elevated white blood cell count.  We can evaluate this with the high resolution CT chest.  Does not show anemia.  This is good news.  Hinton Dyer, can this information also be routed to patient's primary care provider  Wyn Quaker, FNP

## 2019-06-06 ENCOUNTER — Other Ambulatory Visit: Payer: Self-pay | Admitting: *Deleted

## 2019-06-06 NOTE — Progress Notes (Signed)
HRCT previously ordered

## 2019-06-08 NOTE — Telephone Encounter (Signed)
Stephen [name]  Because he cannot do PFT well - I agree getting HRCT now and if there is further progression between March 2020 -> Now; then changing from ofev to esbriet is best. We are hampered at research due to lack of staffing. So till then this is best approach  Thanks MR   Allergies  Allergen Reactions  . Ciprofloxacin Other (See Comments)    colitis  . Codeine

## 2019-06-09 NOTE — Telephone Encounter (Signed)
[place] noted. Thanks.   Aaron Edelman

## 2019-06-27 ENCOUNTER — Ambulatory Visit (INDEPENDENT_AMBULATORY_CARE_PROVIDER_SITE_OTHER)
Admission: RE | Admit: 2019-06-27 | Discharge: 2019-06-27 | Disposition: A | Discharge status: Home / Self Care | Payer: PPO | Source: Ambulatory Visit | Attending: Pulmonary Disease | Admitting: Pulmonary Disease

## 2019-06-27 ENCOUNTER — Other Ambulatory Visit: Payer: Self-pay

## 2019-06-27 DIAGNOSIS — J84112 Idiopathic pulmonary fibrosis: Secondary | ICD-10-CM

## 2019-06-27 DIAGNOSIS — J479 Bronchiectasis, uncomplicated: Secondary | ICD-10-CM | POA: Diagnosis not present

## 2019-06-27 DIAGNOSIS — J8410 Pulmonary fibrosis, unspecified: Secondary | ICD-10-CM | POA: Diagnosis not present

## 2019-06-29 NOTE — Progress Notes (Signed)
Slight interval progression of IPF / fibrosis on high-resolution CT chest from comparison and March/2020.  I believe we need to get you scheduled for a spirometry with DLCO in our office.  Please place the order.  Please also write a telephone message in route to [facility] for protrusion to coordinate.  Patient should have follow-up with Dr. Chase Caller preferably or with myself to review lung functioning after spirometry with DLCO.  Wyn Quaker, FNP

## 2019-06-30 ENCOUNTER — Other Ambulatory Visit: Payer: Self-pay | Admitting: General Surgery

## 2019-06-30 ENCOUNTER — Encounter: Payer: Self-pay | Admitting: Internal Medicine

## 2019-06-30 ENCOUNTER — Ambulatory Visit (INDEPENDENT_AMBULATORY_CARE_PROVIDER_SITE_OTHER): Payer: PPO | Admitting: Internal Medicine

## 2019-06-30 ENCOUNTER — Other Ambulatory Visit: Payer: Self-pay

## 2019-06-30 VITALS — BP 128/70 | HR 62 | Temp 97.3°F | Wt 240.8 lb

## 2019-06-30 DIAGNOSIS — Z23 Encounter for immunization: Secondary | ICD-10-CM | POA: Diagnosis not present

## 2019-06-30 DIAGNOSIS — Z5181 Encounter for therapeutic drug level monitoring: Secondary | ICD-10-CM | POA: Diagnosis not present

## 2019-06-30 DIAGNOSIS — E785 Hyperlipidemia, unspecified: Secondary | ICD-10-CM | POA: Diagnosis not present

## 2019-06-30 DIAGNOSIS — J84112 Idiopathic pulmonary fibrosis: Secondary | ICD-10-CM

## 2019-06-30 DIAGNOSIS — K521 Toxic gastroenteritis and colitis: Secondary | ICD-10-CM

## 2019-06-30 DIAGNOSIS — I10 Essential (primary) hypertension: Secondary | ICD-10-CM | POA: Diagnosis not present

## 2019-06-30 LAB — HEPATIC FUNCTION PANEL
ALT: 31 U/L (ref 0–53)
AST: 31 U/L (ref 0–37)
Albumin: 3.9 g/dL (ref 3.5–5.2)
Alkaline Phosphatase: 48 U/L (ref 39–117)
Bilirubin, Direct: 0.1 mg/dL (ref 0.0–0.3)
Total Bilirubin: 0.3 mg/dL (ref 0.2–1.2)
Total Protein: 7.2 g/dL (ref 6.0–8.3)

## 2019-06-30 NOTE — Progress Notes (Signed)
Patient to see Dr. Chase Caller today on 06/30/2019.  Patient CT can be reviewed at that time.  I discussed with Dr. ramus only the patient likely needs to be set up for spirometry with DLCO in her office.  Let us please make sure we coordinate with Hinton Dyer to ensure that this get scheduled after they see the patient on 06/30/2019.  Wyn Quaker, FNP

## 2019-06-30 NOTE — Progress Notes (Signed)
IOV 03/05/2018  Chief Complaint  Patient presents with   Consult    Referral per MD Helene Kelp, Dec 2018 had sinus infection and still has cough, hx of mild bronchiectasis, later was given symbicort which patient believes is making him more SOB.      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  He lives in [facility] and presents with his wife for evaluation of pulmonary fibrosis to the ILD clinic.  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  During this time he was not exposed to any metal dust but he was exposed to mold and damp environments intermittently but not always.  He denies any mold or mildew in the house.  He tells me that in December 2018 he had a sinus infection and since then has a lingering cough that never really went away.  It looks like from the primary care physician he saw dentist and from there he had imaging and this finally all resulted in a high-resolution CT chest January 21, 2018 that I personally visualized.  The official report is that it is indeterminate for UIP.  In my personal visualization there is bilateral bibasal symmetric disease with definite of craniocaudal gradient.  The septal thickening and also cylindrical bronchiectasis and peripheral bronchiec bronchiolectasis.  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  He does not have much of her shortness of breath but then he is morbidly obese and has balance issues and is quite limited.  He was seen by a local pulmonologist will give him a 2-5-year survival rate and therefore he is frustrated.  He was started on Symbicort which actually made things worse for him.  [facility] pulmonary interstitial lung disease questionnaire  Symptoms: Since dec 2019. Some sputjm +. Better since started. Insidious dyspnea x 4 months. Same since onset. No episodic  dyspnea. lLeve 4 dyspnea walking wiuth peers and level 5 walking up hill/stairs. Level 1 for dressing, Level 2 fo rshopping and mpwing lawn  ROS: chronic balance issues affecting mobiluity.  In association with this he has fatigue for the last 3 years.  He has dry mouth for the last several years associated with some dysphagia for the last 10 years heartburn and acid reflux for several years.  Daytime set sleepiness for the last several years but denies any oral ulcers or rash or weight loss or recurrent fever or arthralgia  Past medical history: Denies any asthma COPD or heart failure rheumatoid arthritis or scleroderma or systemic lupus erythematosus or polymyositis or Sjogren's but is positive for sleep apnea and hiatal hernia.  Negative for pulmonary hypertension diabetes thyroid disease stroke seizures hepatitis tuberculosis kidney disease blood clots heart disease other than murmur pleurisy  Family history of pulmonary disease: Positive for asthma in the mother but nobody with pulmonary fibrosis  Personal exposure history: He has never smoked cigarettes did smoke cigars for 3 years.  [place]  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  During this time there is intermittent mold exposure  Pulmonary toxicity history denies a laundry list of pulmonary toxicity medications    OV 04/11/2018  Chief Complaint  Patient presents with   Follow-up    PFT performed today.  Pt is still coughing with occ. white mucus and pt also states he believes SOB is worse and is becoming SOB more easily.    Follow-up interstitial lung disease work-up  Symptoms: In the interim no new symptoms.  He does admit to having  chronic arthralgia for many decades after sheetrock fell on him.  He also has joint stiffness especially in the hands but he does not know if it is early morning stiffness.  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 Lorin Picket.  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  His autoimmune and vascular does panel shows trace positivity for ANCA and cyclic citrulline peptide.    OV 06/04/2018   Chief Complaint  Patient presents with   Follow-up    Pt states things have been about the same since last visit.   FU ILD - ccp and atypical p-anca trace positive May 2019. HP panel negative.  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 Lung function: Severity -FVC 63% and DLCO 58% showing moderate ILD. Rheum evaluation - 04/25/18 - no evidence of RA (Dr Trudie Reed)  Here with his wife to discuss test results. In the interim he saw Dr. Gavin Pound on 04/25/2018. She is of rheumatology. Clinically she did not find any evidence of systemic rheumatoid arthritis or vasculitis. Therefore he is here for decision making regarding his ILD. In the interim there are no new issues.  OV 07/16/2018  Subjective:  Patient ID: Stephen [name], male , DOB: [birth year] , age 49 y.o. , MRN: [id number] , ADDRESS: [address] [place] [address]   07/16/2018 -   Chief Complaint  Patient presents with   Follow-up    Pt started [facility] 8/11 and so far denies any complaints.    FU ILD - ccp and atypical p-anca trace positive May 2019. HP panel negative.  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 Lung function: Severity -FVC 63% and DLCO 58% showing moderate ILD. Rheum evaluation - 04/25/18 - no evidence of RA (Dr Trudie Reed). Dx of IPF given 06/04/18. STarted ofev 8/11/9    HPI Stephen [name] 18 y.o. - presents for follow-up of his idiopathic pulmonary fibrosis. Diagnoses given 06/04/2018. He is started on  Ofev 06/09/2018. He is here with his wife. He says he is tolerating the Ofev just fine. No GI issues. He gets support from the Child psychotherapist Clarene Critchley. He will have a liver function test today. He deferred flu shot because he will have it with his primary care physician. He told me that he is planning to join the pulmonary fibrosis foundation patient support group. Next meeting is 07/25/2018 Thursday 6 PM at [facility]. He is also willing to join research trials.He has mobility issues on account of his obesity and cane use. He says he cannot do a treadmill or walk long distances.   He has chronic tinnitus and apparently this is from working in [employer] base during [place] War although he was never deployed overseas. He started getting VA eligibility. He asked [place] to fill forms. ROS - per HPI  OV 09/03/2018  Subjective:  Patient ID: Stephen Salaam  Sammie [name], male , DOB: 01-20-1951 , age 25 y.o. , MRN: [id number] , ADDRESS: [address] [place] [address]   09/03/2018 -   Chief Complaint  Patient presents with   Follow-up    Doing well at this time coughing up white mucus     FU ILD - ccp and atypical p-anca trace positive May 2019. HP panel negative.  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 Lung function: Severity -FVC 63% and DLCO 58% showing moderate ILD. Rheum evaluation - 04/25/18 - no evidence of RA (Dr Trudie Reed). Dx of IPF given 06/04/18. STarted ofev 8/11/9   HPI Stephen [name] 43 y.o. -returns for follow-up of his IPF.  He has been on nintedanib since mid August 2019.  He is here with his wife.  So far is tolerating the nintedanib just fine.  He says that his baseline constipation still continues and he needs both MiraLAX and Citrucel.  However what he notices that when he has relief of constipation it is diarrhea.  He says he is not able to come down on the laxative dose because otherwise the constipation returns.  He feels the 05 is not causing any diarrhea.  Is no  abdominal pain nausea vomiting.  In terms of shortness of breath is stable.  He tried to have pulmonary function test as follow-up like a few weeks ago but he started coughing and did not produce enough spirometry's.  All we have the symptoms which is stable.  He is up-to-date with his flu shot.  He is interested in research protocols.  He uses a cane normally.      OV 12/17/2018  Subjective:  Patient ID: Stephen [name], male , DOB: 12-29-1950 , age 68 y.o. , MRN: [id number] , ADDRESS: [address] [place] [address]   12/17/2018 -   Chief Complaint  Patient presents with   Follow-up    PFT performed today but pt was unable to perform DLCO due to coughing. Pt states SOB is about the same. Denies any CP or chest tightness.    FU ILD - ccp and atypical p-anca- ALL trace positive May 2019. HP panel negative.  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 Lung function: Severity -FVC 63% and DLCO 58% showing moderate ILD. Rheum evaluation - 04/25/18 - no evidence of RA (Dr Trudie Reed). Dx of IPF given 06/04/18. STarted ofev 8/11/9   HPI Stephen [name] 11 y.o. -IPF follow-up.  He presents with his wife as usual.  He tells me that starting January 2020 nintedanib started causing significant diarrhea.  By the third week of January 2020 he reduced himself to 1 tablet once daily at 150 mg.  With this there is no diarrhea.  He says that at the full dose he would take Imodium for diarrhea and then he would get constipated for several days.  He would follow this with a laxative and then he would have explosive diarrhea.  He said the symptoms became unmanageable.  In terms of his IPF itself he is stable.  His other main issues worsening chronic cough.  He has had chronic cough for b for several years predating IPF diagnosis.  He has been on ACE inhibitor's but it appears that lisinopril got rotated to benazepril and then to losartan but the cough never improved.  So he got switch back to benazepril.  He says that his  primary care team is aware that all these agents can cause cough  but the emphasis was on controlling the blood pressure.  He says the cough is severe.  Mostly in the daytime very rarely at night.  There is a laryngeal hoarse quality to his cough.  Noticed results on fish oil and carvedilol.  We discussed about neuropathic treatment for cough but he is already on amitriptyline    Review of Systems ROS - per HPI   OV 06/30/2019  Subjective:  Patient ID: Stephen [name], male , DOB: 1950/12/01 , age 69 y.o. , MRN: [id number] , ADDRESS: [address] [place] [address]   06/30/2019 -   Chief Complaint  Patient presents with   IPF (idiopathic pulmonary fibrosis)    Does not feel the breathing is any different than last visit. Still using Ofev, but still having diarrhea with it.     FU ILD - ccp and atypical p-anca- ALL trace positive May 2019. HP panel negative.  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 Lung function: Severity -FVC 63% and DLCO 58% showing moderate ILD. Rheum evaluation - 04/25/18 - no evidence of RA (Dr Trudie Reed). Dx of IPF given 06/04/18. STarted ofev 06/09/18  HPI Stephen [name] 74 y.o. -presents for follow-up with his wife.  He has a diagnosis of IPF.  He has been on nintedanib since August 2019.  He preferred to take nintedanib initially because of his preference to have diarrhea.  At this point in time he tells me that he is having progressive dyspnea.  Early in the spring 2020 he noticed that he could not walk back from his office to the front door.  He could do this in 2019.  He also has had difficulty mowing the lawn with the more.  In addition is having significant diarrhea from the nintedanib.  It is severe despite agents.  He is willing to change the drug.  In between he saw the nurse practitioner he had a high-resolution CT chest in August 2020.  He has mildly progressive disease even compared to March 2020.  Walking desaturation test and symptom scores show evidence of  progression and documented below.     SYMPTOM SCALE - ILD 12/17/2018  06/30/2019   O2 use RA ra  Shortness of Breath 0 -> 5 scale with 5 being worst (score 6 If unable to do)   At rest 1 1  Simple tasks - showers, clothes change, eating, shaving 3 4  Household (dishes, doing bed, laundry) x 0  Walking level at own pace 1 1  Walking keeping up with others of same age 31 4  Walking up Stairs 3 5  Walking up Hill 5 5  Total (40 - 48) Dyspnea Score 16 20  How bad is your cough? 5 3  How bad is your fatigue 3 3       Results for Stephen [name], Stephen [name] (MRN [id number]) as of 12/17/2018 14:44  Ref. Range 04/11/2018 10:31 10/03/2018 14:46 12/17/2018   FVC-Pre Latest Units: L 2.57 2.42 2.42  FVC-%Pred-Pre Latest Units: % 63 65 65%      Simple office walk 185 feet x  3 laps goal with forehead probe 03/05/2018  06/04/2018   09/03/2018  12/17/2018  06/30/2019   O2 used _0   Number laps completed All 3 laops All 3 laps all3 3 x 185 3  Comments about pace Walks very slow due to baseline balance issue "40% of balance only due to remote trauma  nomral mod pace using cane Slow with cane Slow with ane  Resting Pulse Ox/HR 100% and 55/min 100% and 57/min 100% and 59/min 100% and 61/min 97% and 56/min  Final Pulse Ox/HR 97% and 78/min 97% and 77/mi 98% and 76/min 95% and 91/min 91% and 87/min  Desaturated </= 88% _0   Desaturated <= 3% points yes yes no Yes, 5 points Yes, 6 points  Got Tachycardic >/= 90/min no no no yes no  Symptoms at end of test No, denied dyspnea x none none none  Miscellaneous comments npne x x      IMPRESSION: HRCT - slighly progressed since march 2020 1. Spectrum of findings compatible with basilar predominant fibrotic interstitial lung disease with probable early honeycombing at the right costophrenic angle. Slight interval progression. Findings are consistent with UIP per consensus guidelines: Diagnosis of Idiopathic  Pulmonary Fibrosis: An Official ATS/ERS/JRS/ALAT Clinical Practice Guideline. [place], Iss 5, [phone], Jun 30 2017. 2. Mild mediastinal lymphadenopathy is stable, compatible with benign reactive adenopathy. 3. Three-vessel coronary atherosclerosis.  Aortic Atherosclerosis (ICD10-I70.0).   Electronically Signed   By: Ilona Sorrel M.D.   On: 06/28/2019 16:35  ROS - per HPI     has a past medical history of Allergy, Arthritis, GERD (gastroesophageal reflux disease), Heart murmur, Hiatal hernia, Hyperlipidemia, Hypertension, Sleep apnea, and Vertigo.   reports that he has never smoked. He has never used smokeless tobacco.  Past Surgical History:  Procedure Laterality Date   CARDIAC CATHETERIZATION  03/22/2001   LEG SURGERY Bilateral 04/2001    Allergies  Allergen Reactions   Ciprofloxacin Other (See Comments)    colitis   Codeine     Immunization History  Administered Date(s) Administered   Fluad Quad(high Dose 65+) 06/30/2019   Influenza, High Dose Seasonal PF 08/28/2017, 07/19/2018   Pneumococcal Conjugate-13 08/25/2016   Pneumococcal Polysaccharide-23 11/18/2007    Family History  Problem Relation Age of Onset   Dementia Mother    Heart attack Father    Aneurysm Father    Cancer Maternal Grandmother    Aneurysm Maternal Grandmother      Current Outpatient Medications:    amitriptyline (ELAVIL) 75 MG tablet, Take 75 mg by mouth at bedtime., Disp: , Rfl:    amLODipine (NORVASC) 10 MG tablet, Take 10 mg by mouth daily., Disp: , Rfl:    aspirin EC 81 MG tablet, Take 81 mg by mouth daily., Disp: , Rfl:    calcium carbonate (OSCAL) 1500 (600 Ca) MG TABS tablet, Take by mouth 2 (two) times daily with a meal., Disp: , Rfl:    calcium carbonate (TUMS - DOSED IN MG ELEMENTAL CALCIUM) 500 MG chewable tablet, Chew 1 tablet by mouth daily., Disp: , Rfl:    cholecalciferol (VITAMIN D) 1000 units tablet, Take 1,000 Units by  mouth daily., Disp: , Rfl:    cloNIDine (CATAPRES) 0.3 MG tablet, Take 0.3 mg by mouth daily after supper. , Disp: , Rfl:    dicyclomine (BENTYL) 10 MG capsule, Take 10 mg by mouth 4 (four) times daily -  before meals and at bedtime., Disp: , Rfl:    lansoprazole (PREVACID) 30 MG capsule, Take 30 mg by mouth daily at 12 noon., Disp: , Rfl:    Methylcellulose, Laxative, (CITRUCEL PO), Take 1 Scoop by mouth., Disp: , Rfl:    metoprolol tartrate (LOPRESSOR) 50 MG tablet, Take 1 tablet by mouth 2 (two) times daily., Disp: , Rfl:  montelukast (SINGULAIR) 10 MG tablet, Take 10 mg by mouth at bedtime., Disp: , Rfl:    Multiple Vitamin (MULTIVITAMIN WITH MINERALS) TABS tablet, Take 1 tablet by mouth daily., Disp: , Rfl:    nabumetone (RELAFEN) 750 MG tablet, Take 750 mg by mouth 2 (two) times daily., Disp: , Rfl:    OFEV 150 MG CAPS, , Disp: , Rfl:    olmesartan (BENICAR) 20 MG tablet, Take 1 tablet by mouth daily., Disp: , Rfl:    polyethylene glycol (MIRALAX / GLYCOLAX) packet, Take 17 g by mouth daily., Disp: , Rfl:    Probiotic Product (PROBIOTIC DAILY PO), Take 1 capsule by mouth daily., Disp: , Rfl:    rosuvastatin (CRESTOR) 10 MG tablet, Take 10 mg by mouth daily., Disp: , Rfl:    sodium chloride 1 g tablet, Take 1 g by mouth 3 (three) times daily., Disp: , Rfl:    spironolactone (ALDACTONE) 25 MG tablet, Take 25 mg by mouth 3 (three) times a week., Disp: , Rfl:    UNABLE TO FIND, Take 1 capsule by mouth 3 (three) times daily. Med Name: blood sugar harmony, Disp: , Rfl:    UNABLE TO FIND, Take 1 capsule by mouth 3 (three) times daily. Med Name: Clear lungs, Disp: , Rfl:    vitamin B-12 (CYANOCOBALAMIN) 1000 MCG tablet, Take 1,000 mcg by mouth daily., Disp: , Rfl:    vitamin E 400 UNIT capsule, Take 400 Units by mouth daily., Disp: , Rfl:       Objective:   Vitals:   06/30/19 1129  BP: 128/70  Pulse: 62  Temp: (!) 97.3 F (36.3 C)  SpO2: 98%  Weight: 240 lb 12.8 oz  (109.2 kg)    Estimated body mass index is 38.87 kg/m as calculated from the following:   Height as of 06/05/19: _0  (1.676 m).   Weight as of this encounter: 240 lb 12.8 oz (109.2 kg).  _1 @  Filed Weights   06/30/19 1129  Weight: 240 lb 12.8 oz (109.2 kg)     Physical Exam  General Appearance:    Alert, cooperative, no distress, appears stated age - yes , Deconditioned looking - no , OBESE  - yesno, Sitting on Wheelchair -  no  Head:    Normocephalic, without obvious abnormality, atraumatic  Eyes:    PERRL, conjunctiva/corneas clear,  Ears:    Normal TM's and external ear canals, both ears  Nose:   Nares normal, septum midline, mucosa normal, no drainage    or sinus tenderness. OXYGEN ON  - no . Patient is @ no   Throat:   Lips, mucosa, and tongue normal; teeth and gums normal. Cyanosis on lips - ra  Neck:   Supple, symmetrical, trachea midline, no adenopathy;    thyroid:  no enlargement/tenderness/nodules; no carotid   bruit or JVD  Back:     Symmetric, no curvature, ROM normal, no CVA tenderness  Lungs:     Distress - no , Wheeze no, Barrell Chest - no, Purse lip breathing - no, Crackles - yes at base   Chest Wall:    No tenderness or deformity.    Heart:    Regular rate and rhythm, S1 and S2 normal, no rub   or gallop, Murmur - no  Breast Exam:    NOT DONE  Abdomen:     Soft, non-tender, bowel sounds active all four quadrants,    no masses, no organomegaly. Visceral obesity - yes  Genitalia:  NOT DONE  Rectal:   NOT DONE  Extremities:   Extremities - normal, Has Cane - yes, Clubbing - no, Edema - no  Pulses:   2+ and symmetric all extremities  Skin:   Stigmata of Connective Tissue Disease - no  Lymph nodes:   Cervical, supraclavicular, and axillary nodes normal  Psychiatric:  Neurologic:   Pleasant - yes, Anxious - no, Flat affect - no  CAm-ICU - neg, Alert and Oriented x 3 - yes, Moves all 4s - yes, Speech - normal, Cognition - intact             Assessment:       ICD-10-CM   1. Diarrhea due to drug  K52.1   2. Therapeutic drug monitoring  Z51.81   3. Idiopathic pulmonary fibrosis ([place])  J84.112   4. Need for immunization against influenza  Z23 Flu Vaccine QUAD High Dose(Fluad)       Plan:     Patient Instructions  Diarrhea due to drug - ofev Therapeutic drug monitoring  Plan  - stop ofev for 1-2 weeks at least  - check LFT 06/30/2019   Idiopathic pulmonary fibrosis ([place])  - mildly progressive over time  Plan - high dose flu shot 06/30/2019  - start esbriet per protocol  -  Use this with sunscreen  - take after food  - no ofev for 1 week prior to starting esbriet  - research protocols in the future  Followup 6 weeks to ILD clinic for review - to ensure esbriet start has gone well  > 50% of this > 25 min visit spent in face to face counseling or coordination of care - by this undersigned MD - Dr Brand Males. This includes one or more of the following documented above: discussion of test results, diagnostic or treatment recommendations, prognosis, risks and benefits of management options, instructions, education, compliance or risk-factor reduction   SIGNATURE    Dr. Brand Males, M.D., F.C.C.P,  Pulmonary and Critical Care Medicine Staff Physician, [place] Director - Interstitial Lung Disease  Program  Pulmonary [place] at [place], [place], [address]  Pager: [phone], If no answer or between  15:00h - 7:00h: call [phone] Telephone: [phone]  12:16 PM 06/30/2019

## 2019-06-30 NOTE — Patient Instructions (Addendum)
Diarrhea due to drug - ofev Therapeutic drug monitoring  Plan  - stop ofev for 1-2 weeks at least  - check LFT 06/30/2019   Idiopathic pulmonary fibrosis (Stephen [name])  - mildly progressive over time  Plan - high dose flu shot 06/30/2019  - start esbriet per protocol  -  Use this with sunscreen  - take after food  - no ofev for 1 week prior to starting esbriet  - research protocols in the future  Followup 6 weeks to ILD clinic for review - to ensure esbriet start has gone well

## 2019-06-30 NOTE — Addendum Note (Signed)
Addended by: Suzzanne Cloud E on: 06/30/2019 12:21 PM   Modules accepted: Orders

## 2019-07-09 ENCOUNTER — Ambulatory Visit: Payer: PPO | Admitting: Pulmonary Disease

## 2019-07-14 ENCOUNTER — Telehealth: Payer: Self-pay | Admitting: Internal Medicine

## 2019-07-14 DIAGNOSIS — J84112 Idiopathic pulmonary fibrosis: Secondary | ICD-10-CM

## 2019-07-14 NOTE — Telephone Encounter (Signed)
Patient calling about form for Esbriet that he dropped off - he can be reached [phone]

## 2019-07-14 NOTE — Telephone Encounter (Signed)
Called and spoke to patient.  Patient stated that he received a call from [place] stating they have not received the paper work he dropped off on 07/03/2019.  Patient stated that he completed the forms and gave to Hinton Dyer and that Hinton Dyer reported she would make a copy and fax information over.  Routing to [place] to locate forms and follow up on.

## 2019-07-14 NOTE — Telephone Encounter (Signed)
Called [employer] [facility] [phone] and they did not receive the form that was faxed to them on 07/03/19 to [phone].  She stated they did mail a copy of the form to the patient for signature and if possible he can fill it out, take a picture of it with his cell phone and send it to them.  The original paper signed by the patient cannot be found as it was sent to be scanned by medical records and is not in the chart yet to reprint and resubmit.

## 2019-07-15 DIAGNOSIS — G4733 Obstructive sleep apnea (adult) (pediatric): Secondary | ICD-10-CM | POA: Diagnosis not present

## 2019-07-15 NOTE — Telephone Encounter (Signed)
Called the patient and he stated he filled out the form that was mailed by [employer] and sent it by text earlier today to [phone].  [employer] [phone]. Spoke with Hetty Blend and was told the form will take 24 hours to process, it is not showing in their system yet.  I told him I will try back again tomorrow afternoon. Hetty Blend said that if it is not received, they can process it over the phone with the patient.

## 2019-07-16 NOTE — Telephone Encounter (Signed)
When I called [employer] yesterday afternoon. I was told to call them back this afternoon to allow them 24 hours to process the form that he filled out and sent to them by text.

## 2019-07-16 NOTE — Telephone Encounter (Signed)
Called [employer], Rep Raquel Sarna did not see patient's consent on file. Reached out to patient and had him send me the consent form. Faxed it to [facility]- Attention to the [place] team  Phone# [phone] Fax# [phone]  10:11 AM [name], CPhT

## 2019-07-18 NOTE — Telephone Encounter (Signed)
[place] Team, Rep Cristie Hem advised that patient's consent is now on file and his paperwork is in the benefit's verification stage. They will fax office once complete.  Phone# [phone]  10:39 AM [name], CPhT

## 2019-07-18 NOTE — Telephone Encounter (Signed)
Yes, we will keep track of status and will update the patient once we receive a response.

## 2019-07-18 NOTE — Telephone Encounter (Signed)
Thank you, so at this point will your group keep in communication with the patient and the follow up with [employer]?

## 2019-07-21 NOTE — Telephone Encounter (Signed)
Pt is asking to speak w/ MR's nurse & can be reached at [phone].

## 2019-07-21 NOTE — Telephone Encounter (Signed)
Called and spoke to patient.  Patient is anxious about getting this medication started. Patient stated that he has been off of his Ofev for 3 weeks now and was only supposed to be off for a week and then start the Victoria.   Patient wanted to know an update on status of medication.  Patient said "can you just go ahead and send the prescription over to [facility] so they can get something done."     Patient seems frustrated with the process. I let patient know that someone from the pharmacy team will be in contact with him about this.   Routing to [place], [employer], and [place] for follow up.

## 2019-07-22 ENCOUNTER — Telehealth: Payer: Self-pay | Admitting: Internal Medicine

## 2019-07-22 NOTE — Telephone Encounter (Signed)
Called patient and followed up, he confirmed that he has active patient assistance with [employer]. Patient would like Esbriet prescription sent to [facility] today. Patient will let office know if he has any trouble with using his Francis. Patient voiced his frustration that he has been waiting to start new medication for 4 weeks. Pharmacy team received request to assist with patient documents on 9/16.  Submitted expedited PA for Esbriet via Covermymeds, as I do not see any documentation on file of one having been submitted. Will update once we receive a response.  10:22 AM [name], CPhT

## 2019-07-22 NOTE — Telephone Encounter (Signed)
Pt calling to check on the status of his medication and consent form. If someone could give him a call back

## 2019-07-22 NOTE — Telephone Encounter (Signed)
Patient's plan prefers the capsule form. The tablets are non-formulary. Will work with pharmacist to see if the tablets can get approved.  3:22 PM Stephen [name], CPhT

## 2019-07-22 NOTE — Telephone Encounter (Signed)
Please take a look at the related message dated 07/14/19 (consent form) regarding this patient.   Can this patient take this medication in capsule form? Does it make a difference?   OR, do you need Dr. Chase Caller to sign a new prescription for this in capsule form since the original was for tablets?

## 2019-07-22 NOTE — Telephone Encounter (Signed)
Message was received today from [place] with [facility] saying the medication was approved in capsule form based on the PA submitted.  However, the prescription signed by Dr. Chase Caller as in tablet form. Message forwarded to Pharmacy Team for clarification.

## 2019-07-22 NOTE — Telephone Encounter (Signed)
It does not make a difference as far as efficacy but the higher dose only comes in tablets.  Recommend sending in new prescription for capsule form for starter dosing of  267 mg orally 3 times daily for 7 days, then 534 mg 3 times daily on days 8 through 14, then 801 mg 3 times daily thereafter for 30 day supply.  They should let you do a verbal clarification over the phone.  We can initiate a new PA for tablets so for the maintenance dose he can take the 801 mg tablets vs 3 of the 267 mg capsules.   Let me know if you need any further assistance.  Mariella Saa, PharmD, [place], [place] Clinical Specialty Pharmacist [phone]  07/22/2019 2:51 PM

## 2019-07-22 NOTE — Telephone Encounter (Signed)
Received notification from [employer]) regarding a prior authorization for Esbriet. Authorization has been APPROVED from 07/22/2019 to 07/21/2020.    Authorization # [id number]

## 2019-07-22 NOTE — Telephone Encounter (Signed)
error

## 2019-07-22 NOTE — Telephone Encounter (Signed)
Followed up with Stephen [name] on the status of patient's application. Rep [name], advised benefits investigation was completed yesterday and status forms were sent to office. Patient called Stephen [name] and advised that he is also enrolled with [employer] and will be filling with [facility]. Patient will need prescription sent to [facility]. Rep said office can reach out to [name] if patient needs additional assistance.  Phone# [phone]  10:05 AM [name], CPhT

## 2019-07-22 NOTE — Telephone Encounter (Signed)
Ran test claim for 856m tablets and patient's plan pays.

## 2019-07-23 ENCOUNTER — Telehealth: Payer: Self-pay | Admitting: Internal Medicine

## 2019-07-23 MED ORDER — ESBRIET 801 MG PO TABS: 1.0000 | ORAL_TABLET | Freq: Three times a day (TID) | ORAL | 0 refills | Status: DC

## 2019-07-23 MED ORDER — ESBRIET 267 MG PO CAPS: ORAL_CAPSULE | ORAL | 0 refills | Status: DC

## 2019-07-23 NOTE — Telephone Encounter (Signed)
So based on the information below, the prescription issued will be processed and the shipment sent to patient correct?  Is there anything else that needs to be done by our clinic at this point since the paperwork has already been submitted and processed?

## 2019-07-23 NOTE — Telephone Encounter (Signed)
Verbal order given for Esbriet capsules for starter dose.  Verbal order given for Esbriet tablet maintenance dose for 30 day supply with no refills.  Please schedule patient for follow up LFT labs in 4 weeks. Thank you.   Mariella Saa, PharmD, [place], [place] Clinical Specialty Pharmacist [phone]  07/23/2019 3:57 PM

## 2019-07-23 NOTE — Telephone Encounter (Signed)
Hi!  There are a few encounters going on this. Amber Rph called in verbal for patient Stephen [name] 219m Capsules to patient's preferred pArpelartoday. Capsules are preferred by insurance plan and were approved by insurance on 07/22/19. Patient has been advised.  Thanks!  4:26 PM RBeatriz Chancellor CPhT

## 2019-07-23 NOTE — Telephone Encounter (Signed)
Called Daleen Snook pharmacist with [facility] she said the capsule was approved and needed further review for the tablet. I did review with Pharmacist, she said she will send a fax letter to our office. Will keep in triage until fax is received.

## 2019-07-23 NOTE — Telephone Encounter (Signed)
Calling requesting to speak to nurse a/b med for esbriet and can be reached @ [phone].Hillery Hunter

## 2019-07-23 NOTE — Addendum Note (Signed)
Addended by: Mariella Saa C on: 07/23/2019 03:59 PM   Modules accepted: Orders

## 2019-07-24 NOTE — Telephone Encounter (Signed)
[facility] contact # provided to patient. Made aware rx was sent on yesterday and he should be hearing from them shortly to verify shipment. Nothing further needed.

## 2019-07-24 NOTE — Telephone Encounter (Signed)
Verbal order given for Esbriet capsules for starter dose.  Verbal order given for Esbriet tablet maintenance dose for 30 day supply with no refills.  Please schedule patient for follow up LFT labs in 4 weeks. Thank you.   Amber Yopp, PharmD, BCACP, [facility] Clinical Specialty Pharmacist [phone]  07/23/2019 3:57 PM   

## 2019-07-24 NOTE — Telephone Encounter (Signed)
Called pt provided # for [facility]. Made aware he should be getting a call from them to verify shipping. Nothing further needed.

## 2019-07-28 NOTE — Telephone Encounter (Signed)
Patient has an appt 10/28. Appt notes states needs labs. Nothing further needed at this time.

## 2019-07-30 DIAGNOSIS — I10 Essential (primary) hypertension: Secondary | ICD-10-CM | POA: Diagnosis not present

## 2019-07-30 DIAGNOSIS — E785 Hyperlipidemia, unspecified: Secondary | ICD-10-CM | POA: Diagnosis not present

## 2019-08-07 ENCOUNTER — Other Ambulatory Visit: Payer: Self-pay | Admitting: *Deleted

## 2019-08-08 NOTE — Telephone Encounter (Signed)
Fax received from [place] confirming the medication was shipped on 07/24/19. Nothing further needed.

## 2019-08-22 ENCOUNTER — Other Ambulatory Visit (HOSPITAL_COMMUNITY)
Admission: RE | Admit: 2019-08-22 | Discharge: 2019-08-22 | Disposition: A | Discharge status: Home / Self Care | Payer: PPO | Source: Ambulatory Visit | Attending: Internal Medicine | Admitting: Internal Medicine

## 2019-08-22 DIAGNOSIS — Z01812 Encounter for preprocedural laboratory examination: Secondary | ICD-10-CM | POA: Insufficient documentation

## 2019-08-22 DIAGNOSIS — Z20828 Contact with and (suspected) exposure to other viral communicable diseases: Secondary | ICD-10-CM | POA: Insufficient documentation

## 2019-08-23 LAB — NOVEL CORONAVIRUS, NAA (HOSP ORDER, SEND-OUT TO REF LAB; TAT 18-24 HRS): SARS-CoV-2, NAA: NOT DETECTED

## 2019-08-27 ENCOUNTER — Encounter: Payer: Self-pay | Admitting: Internal Medicine

## 2019-08-27 ENCOUNTER — Telehealth: Payer: Self-pay | Admitting: Internal Medicine

## 2019-08-27 ENCOUNTER — Ambulatory Visit: Payer: PPO | Admitting: Internal Medicine

## 2019-08-27 ENCOUNTER — Other Ambulatory Visit: Payer: Self-pay

## 2019-08-27 ENCOUNTER — Ambulatory Visit (INDEPENDENT_AMBULATORY_CARE_PROVIDER_SITE_OTHER): Payer: PPO | Admitting: Internal Medicine

## 2019-08-27 VITALS — BP 124/78 | HR 66 | Temp 98.0°F | Ht 66.0 in | Wt 251.0 lb

## 2019-08-27 DIAGNOSIS — Z5181 Encounter for therapeutic drug level monitoring: Secondary | ICD-10-CM

## 2019-08-27 DIAGNOSIS — J84112 Idiopathic pulmonary fibrosis: Secondary | ICD-10-CM

## 2019-08-27 DIAGNOSIS — R0600 Dyspnea, unspecified: Secondary | ICD-10-CM | POA: Diagnosis not present

## 2019-08-27 DIAGNOSIS — R0609 Other forms of dyspnea: Secondary | ICD-10-CM

## 2019-08-27 LAB — PULMONARY FUNCTION TEST
DL/VA % pred: 97 %
DL/VA: 4.06 ml/min/mmHg/L
DLCO unc % pred: 59 %
DLCO unc: 13.67 ml/min/mmHg
FEF 25-75 Pre: 1.85 L/sec
FEF2575-%Pred-Pre: 83 %
FEV1-%Pred-Pre: 62 %
FEV1-Pre: 1.78 L
FEV1FVC-%Pred-Pre: 110 %
FEV6-%Pred-Pre: 60 %
FEV6-Pre: 2.18 L
FEV6FVC-%Pred-Pre: 106 %
FVC-%Pred-Pre: 56 %
FVC-Pre: 2.18 L
Pre FEV1/FVC ratio: 82 %
Pre FEV6/FVC Ratio: 100 %

## 2019-08-27 LAB — HEPATIC FUNCTION PANEL
ALT: 25 U/L (ref 0–53)
AST: 31 U/L (ref 0–37)
Albumin: 4.2 g/dL (ref 3.5–5.2)
Alkaline Phosphatase: 56 U/L (ref 39–117)
Bilirubin, Direct: 0.1 mg/dL (ref 0.0–0.3)
Total Bilirubin: 0.4 mg/dL (ref 0.2–1.2)
Total Protein: 7.5 g/dL (ref 6.0–8.3)

## 2019-08-27 NOTE — Telephone Encounter (Signed)
Thank you so much for reaching out to me Dr. Chase Caller I will get in touch with the patient and make a follow-up appointment.  I have not seen this patient for about a year now.  Hope you have a great day and stay safe. Sunny Schlein

## 2019-08-27 NOTE — Telephone Encounter (Signed)
Thank you

## 2019-08-27 NOTE — Patient Instructions (Addendum)
IPF (idiopathic pulmonary fibrosis) (HCC) Therapeutic drug monitoring  -IPF is worse -Let Stephen [name] see how pirfenidone works for you but at this point am glad you are tolerating it well [currently it is the only approved therapeutic option and in the future have to consider research)  Plan -Continue pirfenidone per schedule -Check liver function test today -Check repeat cyclic citrulline antibody today -to see if there is any possibility autoimmune issues working against a lung -Do a 6-minute walk test anytime in the next few to several weeks [can do it here today or at [facility] if they can do It or we can do it next time] -this is to help you qualify for portable oxygen -Test overnight oxygen study at home; on room air -Refer to pulmonary rehabilitation -We will consider research study early part of 2021 ( we went over the concept of research)  Dyspnea on exertion   -Significantly worse and is correlated with worsening pulmonary fibrosis but I still do think the level of worsening shortness of breath is out of proportion to the worsening of pulmonary fibrosis  -Therefore, I think he has sedentary lifestyle, weight, physical deconditioning and possibly diastolic dysfunction which is stiffness of the heart muscle are all playing a role beyond the pulmonary fibrosis  plan -Refer to pulmonary rehabilitation -I have written to your cardiologist to see if he can reevaluate for possible diastolic dysfunction and management with Lasix -We will see what the oxygen study show   Morbid obesity  - need to discuss weight loss at next visit - needed if transplant is a consideration  Follow-up -In 4 weeks repeat liver function test -Return to see Stephen [name] in 4-6  weeks and 30-minute slot

## 2019-08-27 NOTE — Telephone Encounter (Signed)
Hi Stephen [name] is a patient of yours.  He has pulmonary fibrosis and this is declined.  Concomitant with this decline is that his dyspnea is worse.  However I feel that the worsening of dyspnea is out of proportion relative to the decline in pulmonary function test.  Part of this is being sedentary and gaining weight.  Therefore I am referring him to pulmonary rehabilitation.  However, I wanted if diastolic dysfunction is playing a role.  He had his feet and stockings and he seemed to have pedal edema.  They are from wondering if he could evaluate him for diastolic dysfunction and optimization of diastolic dysfunction is present  Thank you   MR    SIGNATURE    Dr. Brand Males, M.D., F.C.C.P,  Pulmonary and Critical Care Medicine Staff Physician, [place] Director - Interstitial Lung Disease  Program  Pulmonary [place] at [place], [place], [address]  Pager: [phone], If no answer or between  15:00h - 7:00h: call [phone] Telephone: [phone]  10:37 AM 08/27/2019

## 2019-08-27 NOTE — Progress Notes (Signed)
IOV 03/05/2018  Chief Complaint  Patient presents with   Consult    Referral per MD Stephen [name], Dec 2018 had sinus infection and still has cough, hx of mild bronchiectasis, later was given symbicort which patient believes is making him [name] SOB.      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  He lives in [facility] and presents with his wife for evaluation of pulmonary fibrosis to the ILD clinic.  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  During this time he was not exposed to any metal dust but he was exposed to mold and damp environments intermittently but not always.  He denies any mold or mildew in the house.  He tells me that in December 2018 he had a sinus infection and since then has a lingering cough that never really went away.  It looks like from the primary care physician he saw dentist and from there he had imaging and this finally all resulted in a high-resolution CT chest January 21, 2018 that I personally visualized.  The official report is that it is indeterminate for UIP.  In my personal visualization there is bilateral bibasal symmetric disease with definite of craniocaudal gradient.  The septal thickening and also cylindrical bronchiectasis and peripheral bronchiec bronchiolectasis.  Therefore I feel this might be [name] probable UIP although there is an element of groundglass opacities.  He does not have much of her shortness of breath but then he is morbidly obese and has balance issues and is quite limited.  He was seen by a local pulmonologist will give him a 2-5-year survival rate and therefore he is frustrated.  He was started on Symbicort which actually made things worse for him.  Stephen [name] pulmonary interstitial lung disease questionnaire  Symptoms: Since dec 2019. Some sputjm +. Better since started. Insidious dyspnea x 4 months. Same since onset. No episodic  dyspnea. lLeve 4 dyspnea walking wiuth peers and level 5 walking up hill/stairs. Level 1 for dressing, Level 2 fo rshopping and mpwing lawn  ROS: chronic balance issues affecting mobiluity.  In association with this he has fatigue for the last 3 years.  He has dry mouth for the last several years associated with some dysphagia for the last 10 years heartburn and acid reflux for several years.  Daytime set sleepiness for the last several years but denies any oral ulcers or rash or weight loss or recurrent fever or arthralgia  Past medical history: Denies any asthma COPD or heart failure rheumatoid arthritis or scleroderma or systemic lupus erythematosus or polymyositis or Sjogren's but is positive for sleep apnea and hiatal hernia.  Negative for pulmonary hypertension diabetes thyroid disease stroke seizures hepatitis tuberculosis kidney disease blood clots heart disease other than murmur pleurisy  Family history of pulmonary disease: Positive for asthma in the mother but nobody with pulmonary fibrosis  Personal exposure history: He has never smoked cigarettes did smoke cigars for 3 years.  [place]  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  During this time there is intermittent mold  exposure  Pulmonary toxicity history denies a laundry list of pulmonary toxicity medications    OV 04/11/2018  Chief Complaint  Patient presents with   Follow-up    PFT performed today.  Pt is still coughing with occ. white mucus and pt also states he believes SOB is worse and is becoming SOB [name] easily.    Follow-up interstitial lung disease work-up  Symptoms: In the interim no new symptoms.  He does admit to having  chronic arthralgia for many decades after sheetrock fell on him.  He also has joint stiffness especially in the hands but he does not know if it is early morning stiffness.  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 Lorin Picket.  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  His autoimmune and vascular does panel shows trace positivity for ANCA and cyclic citrulline peptide.    OV 06/04/2018   Chief Complaint  Patient presents with   Follow-up    Pt states things have been about the same since last visit.   FU ILD - ccp and atypical p-anca trace positive May 2019. HP panel negative.  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 Lung function: Severity -FVC 63% and DLCO 58% showing moderate ILD. Rheum evaluation - 04/25/18 - no evidence of RA (Dr Trudie Reed)  Here with his wife to discuss test results. In the interim he saw Dr. Gavin Pound on 04/25/2018. She is of rheumatology. Clinically she did not find any evidence of systemic rheumatoid arthritis or vasculitis. Therefore he is here for decision making regarding his ILD. In the interim there are no new issues.  OV 07/16/2018  Subjective:  Patient ID: Stephen [name], male , DOB: 1950/12/11 , age 61 y.o. , MRN: [id number] , ADDRESS: [address] [place] [address]   07/16/2018 -   Chief Complaint  Patient presents with   Follow-up    Pt started [facility] 8/11 and so far denies any complaints.    FU ILD - ccp and atypical p-anca trace positive May 2019. HP panel negative.  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 Lung function: Severity -FVC 63% and DLCO 58% showing moderate ILD. Rheum evaluation - 04/25/18 - no evidence of RA (Dr Trudie Reed). Dx of IPF given 06/04/18. STarted ofev 8/11/9    HPI Stephen [name] 31 y.o. - presents for follow-up of his idiopathic pulmonary fibrosis. Diagnoses given 06/04/2018. He is started on  Ofev 06/09/2018. He is here with his wife. He says he is tolerating the Ofev just fine. No GI issues. He gets support from the Child psychotherapist Clarene Critchley. He will have a liver function test today. He deferred flu shot because he will have it with his primary care physician. He told me that he is planning to join the pulmonary fibrosis foundation patient support group. Next meeting is 07/25/2018 Thursday 6 PM at [facility]. He is also willing to join research trials.He has mobility issues on account of his obesity and cane use. He says he cannot do a treadmill or walk long distances.   He has chronic tinnitus and apparently this is from working in [employer] base during [place] War although he was never deployed overseas. He started getting VA eligibility. He asked [place] to fill forms. ROS - per HPI  OV 09/03/2018  Subjective:  Patient ID:  Stephen [name], male , DOB: Jun 25, 1951 , age 22 y.o. , MRN: [id number] , ADDRESS: [address] [place] [address]   09/03/2018 -   Chief Complaint  Patient presents with   Follow-up    Doing well at this time coughing up white mucus     FU ILD - ccp and atypical p-anca trace positive May 2019. HP panel negative.  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 Lung function: Severity -FVC 63% and DLCO 58% showing moderate ILD. Rheum evaluation - 04/25/18 - no evidence of RA (Dr Trudie Reed). Dx of IPF given 06/04/18. STarted ofev 8/11/9   HPI AYDIEN MAJETTE 68 y.o. -returns for follow-up of his IPF.  He has been on nintedanib since mid August 2019.  He is here with his wife.  So far is tolerating the nintedanib just fine.  He says that his baseline constipation still continues and he needs both MiraLAX and Citrucel.  However what he notices that when he has relief of constipation it is diarrhea.  He says he is not able to come down on the laxative dose because otherwise the constipation returns.  He feels the 05 is not causing any diarrhea.  Is no  abdominal pain nausea vomiting.  In terms of shortness of breath is stable.  He tried to have pulmonary function test as follow-up like a few weeks ago but he started coughing and did not produce enough spirometry's.  All we have the symptoms which is stable.  He is up-to-date with his flu shot.  He is interested in research protocols.  He uses a cane normally.      OV 12/17/2018  Subjective:  Patient ID: Stephen [name], male , DOB: 1950/11/28 , age 51 y.o. , MRN: [id number] , ADDRESS: [address] [place] [address]   12/17/2018 -   Chief Complaint  Patient presents with   Follow-up    PFT performed today but pt was unable to perform DLCO due to coughing. Pt states SOB is about the same. Denies any CP or chest tightness.    FU ILD - ccp and atypical p-anca- ALL trace positive May 2019. HP panel negative.  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 Lung function: Severity -FVC 63% and DLCO 58% showing moderate ILD. Rheum evaluation - 04/25/18 - no evidence of RA (Dr Trudie Reed). Dx of IPF given 06/04/18. STarted ofev 8/11/9   HPI Stephen [name] 5 y.o. -IPF follow-up.  He presents with his wife as usual.  He tells me that starting January 2020 nintedanib started causing significant diarrhea.  By the third week of January 2020 he reduced himself to 1 tablet once daily at 150 mg.  With this there is no diarrhea.  He says that at the full dose he would take Imodium for diarrhea and then he would get constipated for several days.  He would follow this with a laxative and then he would have explosive diarrhea.  He said the symptoms became unmanageable.  In terms of his IPF itself he is stable.  His other main issues worsening chronic cough.  He has had chronic cough for b for several years predating IPF diagnosis.  He has been on ACE inhibitor's but it appears that lisinopril got rotated to benazepril and then to losartan but the cough never improved.  So he got switch back to benazepril.  He says that his  primary care team is aware that all these agents can  cause cough but the emphasis was on controlling the blood pressure.  He says the cough is severe.  Mostly in the daytime very rarely at night.  There is a laryngeal hoarse quality to his cough.  Noticed results on fish oil and carvedilol.  We discussed about neuropathic treatment for cough but he is already on amitriptyline ROS - per HPI   OV 06/30/2019  Subjective:  Patient ID: Stephen [name], male , DOB: [birth year] , age 89 y.o. , MRN: [id number] , ADDRESS: [address] [place] [address]   06/30/2019 -   Chief Complaint  Patient presents with   IPF (idiopathic pulmonary fibrosis)    Does not feel the breathing is any different than last visit. Still using Ofev, but still having diarrhea with it.     FU ILD - ccp and atypical p-anca- ALL trace positive May 2019. HP panel negative.  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 Lung function: Severity -FVC 63% and DLCO 58% showing moderate ILD. Rheum evaluation - 04/25/18 - no evidence of RA (Dr Trudie Reed). Dx of IPF given 06/04/18. STarted ofev 06/09/18  HPI Stephen [name] 43 y.o. -presents for follow-up with his wife.  He has a diagnosis of IPF.  He has been on nintedanib since August 2019.  He preferred to take nintedanib initially because of his preference to have diarrhea.  At this point in time he tells me that he is having progressive dyspnea.  Early in the spring 2020 he noticed that he could not walk back from his office to the front door.  He could do this in 2019.  He also has had difficulty mowing the lawn with the [name].  In addition is having significant diarrhea from the nintedanib.  It is severe despite agents.  He is willing to change the drug.  In between he saw the nurse practitioner he had a high-resolution CT chest in August 2020.  He has mildly progressive disease even compared to March 2020.  Walking desaturation test and symptom scores show evidence of progression and documented  below.       OV 08/27/2019  Subjective:  Patient ID: Stephen [name], male , DOB: [birth year] , age 59 y.o. , MRN: [id number] , ADDRESS: [address] [place] [address]  FU IPF  - ccp and atypical p-anca- ALL trace positive May 2019. HP panel negative.  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 Lung function: Severity -FVC 63% and DLCO 58% showing moderate ILD. Rheum evaluation - 04/25/18 - no evidence of RA (Dr Trudie Reed). Dx of IPF given 06/04/18. STarted ofev 8/11/9. STopped Aug 2020 due to progression and diarrhea. STrted Esbriet -July 25, 2019   08/27/2019 -   Chief Complaint  Patient presents with   Follow-up    Patient reports that his breathing is about the same. He has sob with exertion and productive cough.      HPI Stephen [name] 69 y.o. -presents with his wife for follow-up.  He tells me that Jacklynn Bue was started on July 25, 2019.  He took 3 weeks for it to come.  During this time he was off nintedanib and his diarrhea resolved. Wife tells me that he has been completely sedentary because of shortness of breath.  He feels the shortness of breath is getting worse.  He is also gained weight because of his sedentary lifestyle.  He is already morbidly obese.  He has pedal edema for which she  wears stockings.  He is not using oxygen at night because he is never needed it.  He is very concerned about the shortness of breath getting worse.  In fact it is worse even since his last visit in August 2020.  This is documented in the questionnaire below.  Also seen in the pulmonary function test.  His CT scan of the chest August 2020 shows worsening within 5 months.  I reviewed this with him.  He tells me that even taking a shower makes him desaturate although today he did not desaturate here in with a forehead probe to 88%.  However he did not do a 6-minute walk test.  He feels he would benefit from oxygen     SYMPTOM SCALE - ILD 12/17/2018  06/30/2019 Stop ofev 08/27/2019 esbriet  since July 25, 2019  O2 use RA ra ra  Shortness of Breath 0 -> 5 scale with 5 being worst (score 6 If unable to do)    At rest _0 Simple tasks - showers, clothes change, eating, shaving _1 Household (dishes, doing bed, laundry) x 0 x  Walking level at own pace 1 1 x  Walking keeping up with others of same age _2 Walking up Stairs _3 Walking up Hill _4 Total (40 - 48) Dyspnea Score _5 How bad is your cough? _6 How bad is your fatigue _7 Simple office walk 185 feet x  3 laps goal with forehead probe 03/05/2018  06/04/2018   09/03/2018  12/17/2018  06/30/2019 Stop ofev Start esbriet 08/27/2019   O2 used _8  Room air  Number laps completed All 3 laops All 3 laps all3 3 x 185 3   Comments about pace Walks very slow due to baseline balance issue "40% of balance only due to remote trauma  nomral mod pace using cane Slow with cane Slow with ane Slow with cane  Resting Pulse Ox/HR 100% and 55/min 100% and 57/min 100% and 59/min 100% and 61/min 97% and 56/min 98% and 65/min  Final Pulse Ox/HR 97% and 78/min 97% and 77/mi 98% and 76/min 95% and 91/min 91% and 87/min 92% and 94/mio  Desaturated </= 88% _9  no  Desaturated <= 3% points yes yes no Yes, 5 points Yes, 6 points Yes, 6 point  Got Tachycardic >/= 90/min no no no yes no yes  Symptoms at end of test No, denied dyspnea x none none none veryt dyspneic  Miscellaneous comments npne x x          Results for Stephen [name], Stephen [name] (MRN [id number]) as of 08/27/2019 10:10  Ref. Range 04/11/2018 10:31 08/16/2018 15:35 10/03/2018 14:46 ofev 08/27/2019 08:51 esbriet  FVC-Pre Latest Units: L 2.57 2.53 2.42 2.18  FVC-%Pred-Pre Latest Units: % 63 67 65 56   Results for Stephen [name], Stephen [name] (MRN [id number]) as of 08/27/2019 10:10  Ref. Range 04/11/2018 10:31 08/16/2018 15:35 10/03/2018 14:46 08/27/2019 08:51  DLCO unc Latest Units: ml/min/mmHg 16.68   13.67  DLCO unc  % pred Latest Units: % 58   59    IMPRESSION: HRCT Augu 2020 compared to March 2020 1. Spectrum of findings compatible with basilar predominant fibrotic interstitial lung disease with probable early honeycombing at the right costophrenic angle. Slight interval progression. Findings are consistent with UIP  per consensus guidelines: Diagnosis of Idiopathic Pulmonary Fibrosis: An Official ATS/ERS/JRS/ALAT Clinical Practice Guideline. [place], Iss 5, [phone], Jun 30 2017. 2. Mild mediastinal lymphadenopathy is stable, compatible with benign reactive adenopathy. 3. Three-vessel coronary atherosclerosis.  Aortic Atherosclerosis (ICD10-I70.0).   Electronically Signed   By: Ilona Sorrel M.D.   On: 06/28/2019 16:35  ROS - per HPI     has a past medical history of Allergy, Arthritis, GERD (gastroesophageal reflux disease), Heart murmur, Hiatal hernia, Hyperlipidemia, Hypertension, Sleep apnea, and Vertigo.   reports that he has never smoked. He has never used smokeless tobacco.  Past Surgical History:  Procedure Laterality Date   CARDIAC CATHETERIZATION  03/22/2001   LEG SURGERY Bilateral 04/2001    Allergies  Allergen Reactions   Ciprofloxacin Other (See Comments)    colitis   Codeine     Immunization History  Administered Date(s) Administered   Fluad Quad(high Dose 65+) 06/30/2019   Influenza, High Dose Seasonal PF 08/28/2017, 07/19/2018   Pneumococcal Conjugate-13 08/25/2016   Pneumococcal Polysaccharide-23 11/18/2007    Family History  Problem Relation Age of Onset   Dementia Mother    Heart attack Father    Aneurysm Father    Cancer Maternal Grandmother    Aneurysm Maternal Grandmother      Current Outpatient Medications:    amitriptyline (ELAVIL) 75 MG tablet, Take 75 mg by mouth at bedtime., Disp: , Rfl:    amLODipine (NORVASC) 10 MG tablet, Take 10 mg by mouth daily., Disp: , Rfl:    aspirin EC 81 MG tablet,  Take 81 mg by mouth daily., Disp: , Rfl:    calcium carbonate (OSCAL) 1500 (600 Ca) MG TABS tablet, Take by mouth 2 (two) times daily with a meal., Disp: , Rfl:    calcium carbonate (TUMS - DOSED IN MG ELEMENTAL CALCIUM) 500 MG chewable tablet, Chew 1 tablet by mouth daily., Disp: , Rfl:    cholecalciferol (VITAMIN D) 1000 units tablet, Take 1,000 Units by mouth daily., Disp: , Rfl:    cloNIDine (CATAPRES) 0.3 MG tablet, Take 0.3 mg by mouth daily after supper. , Disp: , Rfl:    dicyclomine (BENTYL) 10 MG capsule, Take 10 mg by mouth 4 (four) times daily -  before meals and at bedtime., Disp: , Rfl:    lansoprazole (PREVACID) 30 MG capsule, Take 30 mg by mouth daily at 12 noon., Disp: , Rfl:    Methylcellulose, Laxative, (CITRUCEL PO), Take 1 Scoop by mouth., Disp: , Rfl:    metoprolol tartrate (LOPRESSOR) 50 MG tablet, Take 1 tablet by mouth 2 (two) times daily., Disp: , Rfl:    montelukast (SINGULAIR) 10 MG tablet, Take 10 mg by mouth at bedtime., Disp: , Rfl:    Multiple Vitamin (MULTIVITAMIN WITH MINERALS) TABS tablet, Take 1 tablet by mouth daily., Disp: , Rfl:    nabumetone (RELAFEN) 750 MG tablet, Take 750 mg by mouth 2 (two) times daily., Disp: , Rfl:    olmesartan (BENICAR) 20 MG tablet, Take 1 tablet by mouth daily., Disp: , Rfl:    Pirfenidone (ESBRIET) 801 MG TABS, Take 1 tablet by mouth 3 (three) times daily., Disp: 270 tablet, Rfl: 0   polyethylene glycol (MIRALAX / GLYCOLAX) packet, Take 17 g by mouth daily., Disp: , Rfl:    Probiotic Product (PROBIOTIC DAILY PO), Take 1 capsule by mouth daily., Disp: , Rfl:    rosuvastatin (CRESTOR) 10 MG tablet, Take 10 mg by mouth daily., Disp: ,  Rfl:    sodium chloride 1 g tablet, Take 1 g by mouth 3 (three) times daily., Disp: , Rfl:    spironolactone (ALDACTONE) 25 MG tablet, Take 25 mg by mouth 3 (three) times a week., Disp: , Rfl:    UNABLE TO FIND, Take 1 capsule by mouth 3 (three) times daily. Med Name: blood sugar  harmony, Disp: , Rfl:    UNABLE TO FIND, Take 1 capsule by mouth 3 (three) times daily. Med Name: Clear lungs, Disp: , Rfl:    vitamin B-12 (CYANOCOBALAMIN) 1000 MCG tablet, Take 1,000 mcg by mouth daily., Disp: , Rfl:    vitamin E 400 UNIT capsule, Take 400 Units by mouth daily., Disp: , Rfl:       Objective:   Vitals:   08/27/19 1009  BP: 124/78  Pulse: 66  Temp: 98 F (36.7 C)  TempSrc: Temporal  SpO2: 97%  Weight: 251 lb (113.9 kg)  Height: 5' 6" (1.676 m)    Estimated body mass index is 40.51 kg/m as calculated from the following:   Height as of this encounter: 5' 6" (1.676 m).   Weight as of this encounter: 251 lb (113.9 kg).  _0 @  [employer]   08/27/19 1009  Weight: 251 lb (113.9 kg)     Physical Exam  General Appearance:    Alert, cooperative, no distress, appears stated age - older , Deconditioned looking - no , OBESE  - yes, Sitting on Wheelchair -  n  Head:    Normocephalic, without obvious abnormality, atraumatic  Eyes:    PERRL, conjunctiva/corneas clear,  Ears:    Normal TM's and external ear canals, both ears  Nose:   Nares normal, septum midline, mucosa normal, no drainage    or sinus tenderness. OXYGEN ON  - no . Patient is @ rq   Throat:   Lips, mucosa, and tongue normal; teeth and gums normal. Cyanosis on lips - no  Neck:   Supple, symmetrical, trachea midline, no adenopathy;    thyroid:  no enlargement/tenderness/nodules; no carotid   bruit or JVD  Back:     Symmetric, no curvature, ROM normal, no CVA tenderness  Lungs:     Distress - no , Wheeze no, Barrell Chest - no, Purse lip breathing - no, Crackles - yes som   Chest Wall:    No tenderness or deformity.    Heart:    Regular rate and rhythm, S1 and S2 normal, no rub   or gallop, Murmur - no  Breast Exam:    NOT DONE  Abdomen:     Soft, non-tender, bowel sounds active all four quadrants,    no masses, no organomegaly. Visceral obesity - yes  Genitalia:   NOT DONE  Rectal:    NOT DONE  Extremities:   Extremities - normal, Has Cane - YES, Clubbing - no, Edema -   Pulses:   2+ and symmetric all extremities  Skin:   Stigmata of Connective Tissue Disease - mp  Lymph nodes:   Cervical, supraclavicular, and axillary nodes normal  Psychiatric:  Neurologic:   Pleasant - ues, Anxious - mp, Flat affect - no  CAm-ICU - neg, Alert and Oriented x 3 - yes, Moves all 4s - yes, Speech - normal, Cognition - intact           Assessment:       ICD-10-CM   1. IPF (idiopathic pulmonary fibrosis) ([place])  J84.112   2. Therapeutic drug monitoring  Z51.81  3. Dyspnea on exertion  R06.00        Plan:     Patient Instructions  IPF (idiopathic pulmonary fibrosis) (H[facility]) Therapeutic drug monitoring  -IPF is worse -Let us see how pirfenidone works for you but at this point am glad you are tolerating it well [currently it is the only approved therapeutic option and in the future have to consider research)  Plan -Continue pirfenidone per schedule -Check liver function test today -Check repeat cyclic citrulline antibody today -to see if there is any possibility autoimmune issues working against a lung -Do a 6-minute walk test anytime in the next few to several weeks [can do it here today or at [facility] if they can do It or we can do it next time] -this is to help you qualify for portable oxygen -Test overnight oxygen study at home; on room air -Refer to pulmonary rehabilitation -We will consider research study early part of 2021 ( we went over the concept of research)  Dyspnea on exertion   -Significantly worse and is correlated with worsening pulmonary fibrosis but I still do think the level of worsening shortness of breath is out of proportion to the worsening of pulmonary fibrosis  -Therefore, I think he has sedentary lifestyle, weight, physical deconditioning and possibly diastolic dysfunction which is stiffness of the heart muscle are all playing a role beyond  the pulmonary fibrosis  plan -Refer to pulmonary rehabilitation -I have written to your cardiologist to see if he can reevaluate for possible diastolic dysfunction and management with Lasix -We will see what the oxygen study show   Morbid obesity  - need to discuss weight loss at next visit - needed if transplant is a consideration  Follow-up -In 4 weeks repeat liver function test -Return to see [place] in 4-6  weeks and 30-minute slot   > 50% of this > 40 min visit spent in face to face counseling or/and coordination of care - by this undersigned MD - Dr Brand Males. This includes one or [name] of the following documented above: discussion of test results, diagnostic or treatment recommendations, prognosis, risks and benefits of management options, instructions, education, compliance or risk-factor reduction   SIGNATURE    Dr. Brand Males, M.D., F.C.C.P,  Pulmonary and Critical Care Medicine Staff Physician, Irvona Director - Interstitial Lung Disease  Program  Pulmonary [place] [facility] [place], [place], [address]  Pager: [phone], If no answer or between  15:00h - 7:00h: call [phone] Telephone: [phone]  10:47 AM 08/27/2019

## 2019-08-27 NOTE — Progress Notes (Signed)
Spiro/DLCO performed today. 

## 2019-08-28 ENCOUNTER — Encounter (HOSPITAL_COMMUNITY): Payer: Self-pay | Admitting: *Deleted

## 2019-08-28 LAB — CYCLIC CITRUL PEPTIDE ANTIBODY, IGG: Cyclic Citrullin Peptide Ab: 16 UNITS

## 2019-08-28 NOTE — Progress Notes (Signed)
Received this referral from Dr. Chase Caller for this pt to participate in Pulmonary Rehab with the diagnosis of IPF.  Pt resides in [place], will confirm that pt's prefernce  to come to [facility] or [facility]. Will ask support staff make the initial contact. Cherre Huger, BSN Cardiac and Training and development officer

## 2019-08-29 ENCOUNTER — Telehealth (HOSPITAL_COMMUNITY): Payer: Self-pay

## 2019-08-29 ENCOUNTER — Encounter (HOSPITAL_COMMUNITY): Payer: Self-pay | Admitting: *Deleted

## 2019-08-29 DIAGNOSIS — I10 Essential (primary) hypertension: Secondary | ICD-10-CM | POA: Diagnosis not present

## 2019-08-29 DIAGNOSIS — G4733 Obstructive sleep apnea (adult) (pediatric): Secondary | ICD-10-CM | POA: Diagnosis not present

## 2019-08-29 DIAGNOSIS — E7800 Pure hypercholesterolemia, unspecified: Secondary | ICD-10-CM | POA: Diagnosis not present

## 2019-08-29 NOTE — Progress Notes (Signed)
Received confirmation that pt would like to participate in Pulmonary Rehab here at [facility].  Pt is able to consistently attend 2 x week and can drive form [place].  Pt is referred by Dr. Chase Caller to participate in PR with the diagnosis of IPF.  Clinical review of pt follow up appt on 10/27.   Pulmonary office note.   Pt appropriate for scheduling for Pulmonary rehab. Pt covid risk score is 4.  Will forward to support staff for scheduling and verification of insurance eligibility/benefits with pt  consent. Cherre Huger, BSN Cardiac and Training and development officer

## 2019-09-01 DIAGNOSIS — N3020 Other chronic cystitis without hematuria: Secondary | ICD-10-CM | POA: Diagnosis not present

## 2019-09-01 DIAGNOSIS — N312 Flaccid neuropathic bladder, not elsewhere classified: Secondary | ICD-10-CM | POA: Diagnosis not present

## 2019-09-08 DIAGNOSIS — J84112 Idiopathic pulmonary fibrosis: Secondary | ICD-10-CM | POA: Diagnosis not present

## 2019-09-10 DIAGNOSIS — Z6838 Body mass index (BMI) 38.0-38.9, adult: Secondary | ICD-10-CM | POA: Diagnosis not present

## 2019-09-10 DIAGNOSIS — E119 Type 2 diabetes mellitus without complications: Secondary | ICD-10-CM | POA: Diagnosis not present

## 2019-09-10 DIAGNOSIS — E871 Hypo-osmolality and hyponatremia: Secondary | ICD-10-CM | POA: Diagnosis not present

## 2019-09-10 DIAGNOSIS — I10 Essential (primary) hypertension: Secondary | ICD-10-CM | POA: Diagnosis not present

## 2019-09-10 DIAGNOSIS — E7800 Pure hypercholesterolemia, unspecified: Secondary | ICD-10-CM | POA: Diagnosis not present

## 2019-09-11 ENCOUNTER — Other Ambulatory Visit: Payer: Self-pay

## 2019-09-11 ENCOUNTER — Encounter (HOSPITAL_COMMUNITY)
Admission: RE | Admit: 2019-09-11 | Discharge: 2019-09-11 | Disposition: A | Discharge status: Home / Self Care | Payer: PPO | Source: Ambulatory Visit | Attending: Internal Medicine | Admitting: Internal Medicine

## 2019-09-11 ENCOUNTER — Telehealth: Payer: Self-pay | Admitting: Internal Medicine

## 2019-09-11 VITALS — BP 140/64 | HR 93 | Ht 67.0 in | Wt 247.4 lb

## 2019-09-11 DIAGNOSIS — J84112 Idiopathic pulmonary fibrosis: Secondary | ICD-10-CM

## 2019-09-11 NOTE — Progress Notes (Signed)
Pulmonary Individual Treatment Plan  Patient Details  Name: Stephen [name] MRN: [id number] Date of Birth: 1951-02-25 Referring Provider:     Pulmonary Rehab Walk Test from 09/11/2019 in [place]  Referring Provider  Dr. Chase Caller      Initial Encounter Date:    Pulmonary Rehab Walk Test from 09/11/2019 in [place]  Date  09/11/19      Visit Diagnosis: Idiopathic pulmonary fibrosis ([place])  Patient's Home Medications on Admission:   Current Outpatient Medications:  .  amitriptyline (ELAVIL) 75 MG tablet, Take 75 mg by mouth at bedtime., Disp: , Rfl:  .  amLODipine (NORVASC) 10 MG tablet, Take 10 mg by mouth daily., Disp: , Rfl:  .  aspirin EC 81 MG tablet, Take 81 mg by mouth daily., Disp: , Rfl:  .  calcium carbonate (TUMS - DOSED IN MG ELEMENTAL CALCIUM) 500 MG chewable tablet, Chew 1 tablet by mouth daily., Disp: , Rfl:  .  cholecalciferol (VITAMIN D) 1000 units tablet, Take 1,000 Units by mouth daily., Disp: , Rfl:  .  cloNIDine (CATAPRES) 0.3 MG tablet, Take 0.3 mg by mouth daily after supper. , Disp: , Rfl:  .  dicyclomine (BENTYL) 10 MG capsule, Take 10 mg by mouth 4 (four) times daily -  before meals and at bedtime., Disp: , Rfl:  .  lansoprazole (PREVACID) 30 MG capsule, Take 30 mg by mouth daily at 12 noon., Disp: , Rfl:  .  Methylcellulose, Laxative, (CITRUCEL PO), Take 1 Scoop by mouth., Disp: , Rfl:  .  metoprolol tartrate (LOPRESSOR) 50 MG tablet, Take 1 tablet by mouth 2 (two) times daily., Disp: , Rfl:  .  montelukast (SINGULAIR) 10 MG tablet, Take 10 mg by mouth at bedtime., Disp: , Rfl:  .  Multiple Vitamin (MULTIVITAMIN WITH MINERALS) TABS tablet, Take 1 tablet by mouth daily., Disp: , Rfl:  .  nabumetone (RELAFEN) 750 MG tablet, Take 750 mg by mouth 2 (two) times daily., Disp: , Rfl:  .  olmesartan (BENICAR) 20 MG tablet, Take 1 tablet by mouth daily., Disp: , Rfl:  .  Pirfenidone (ESBRIET) 801 MG  TABS, Take 1 tablet by mouth 3 (three) times daily., Disp: 270 tablet, Rfl: 0 .  polyethylene glycol (MIRALAX / GLYCOLAX) packet, Take 17 g by mouth daily., Disp: , Rfl:  .  Probiotic Product (PROBIOTIC DAILY PO), Take 1 capsule by mouth daily., Disp: , Rfl:  .  rosuvastatin (CRESTOR) 10 MG tablet, Take 10 mg by mouth daily., Disp: , Rfl:  .  sodium chloride 1 g tablet, Take 1 g by mouth 3 (three) times daily., Disp: , Rfl:  .  spironolactone (ALDACTONE) 25 MG tablet, Take 25 mg by mouth 3 (three) times a week., Disp: , Rfl:  .  UNABLE TO FIND, Take 1 capsule by mouth 3 (three) times daily. Med Name: blood sugar harmony, Disp: , Rfl:  .  UNABLE TO FIND, Take 1 capsule by mouth 3 (three) times daily. Med Name: Clear lungs, Disp: , Rfl:  .  vitamin B-12 (CYANOCOBALAMIN) 1000 MCG tablet, Take 1,000 mcg by mouth daily., Disp: , Rfl:  .  vitamin E 400 UNIT capsule, Take 400 Units by mouth daily., Disp: , Rfl:  .  calcium carbonate (OSCAL) 1500 (600 Ca) MG TABS tablet, Take by mouth 2 (two) times daily with a meal., Disp: , Rfl:   Past Medical History: Past Medical History:  Diagnosis Date  . Allergy   .  Arthritis   . GERD (gastroesophageal reflux disease)   . Heart murmur   . Hiatal hernia   . Hyperlipidemia   . Hypertension   . Sleep apnea   . Vertigo     Tobacco Use: Social History   Tobacco Use  Smoking Status Never Smoker  Smokeless Tobacco Never Used    Labs: Recent Review Flowsheet Data    There is no flowsheet data to display.      Capillary Blood Glucose: No results found for: GLUCAP   Pulmonary Assessment Scores: Pulmonary Assessment Scores    Row Name 09/11/19 1515         ADL UCSD   ADL Phase  Entry     SOB Score total  38       CAT Score   CAT Score  17       UCSD: Self-administered rating of dyspnea associated with activities of daily living (ADLs) 6-point scale (0 = "not at all" to 5 = "maximal or unable to do because of breathlessness")  Scoring  Scores range from 0 to 120.  Minimally important difference is 5 units  CAT: CAT can identify the health impairment of COPD patients and is better correlated with disease progression.  CAT has a scoring range of zero to 40. The CAT score is classified into four groups of low (less than 10), medium (10 - 20), high (21-30) and very high (31-40) based on the impact level of disease on health status. A CAT score over 10 suggests significant symptoms.  A worsening CAT score could be explained by an exacerbation, poor medication adherence, poor inhaler technique, or progression of COPD or comorbid conditions.  CAT MCID is 2 points  mMRC: mMRC (Modified Medical Research Council) Dyspnea Scale is used to assess the degree of baseline functional disability in patients of respiratory disease due to dyspnea. No minimal important difference is established. A decrease in score of 1 point or greater is considered a positive change.   Pulmonary Function Assessment: Pulmonary Function Assessment - 09/11/19 1443      Breath   Bilateral Breath Sounds  Clear    Shortness of Breath  Yes;Limiting activity       Exercise Target Goals: Exercise Program Goal: Individual exercise prescription set using results from initial 6 min walk test and THRR while considering  patient's activity barriers and safety.   Exercise Prescription Goal: Initial exercise prescription builds to 30-45 minutes a day of aerobic activity, 2-3 days per week.  Home exercise guidelines will be given to patient during program as part of exercise prescription that the participant will acknowledge.  Activity Barriers & Risk Stratification: Activity Barriers & Cardiac Risk Stratification - 09/11/19 1441      Activity Barriers & Cardiac Risk Stratification   Activity Barriers  Arthritis;Deconditioning;Muscular Weakness;Shortness of Breath       6 Minute Walk: 6 Minute Walk    Row Name 09/11/19 1533         6 Minute Walk   Phase   Initial     Distance  600 feet     Walk Time  5.25 minutes     # of Rest Breaks  1     MPH  1.14     METS  1.34     RPE  13     Perceived Dyspnea   2     VO2 Peak  4.69     Symptoms  Yes (comment)     Comments  45  sec standing break due to desaturation     Resting HR  78 bpm     Resting BP  134/80     Resting Oxygen Saturation   96 %     Exercise Oxygen Saturation  during 6 min walk  80 %     Max Ex. HR  104 bpm     Max Ex. BP  152/82     2 Minute Post BP  132/84       Interval HR   1 Minute HR  97     2 Minute HR  101     3 Minute HR  104     4 Minute HR  93     5 Minute HR  94     6 Minute HR  96     2 Minute Post HR  91     Interval Heart Rate?  Yes       Interval Oxygen   Interval Oxygen?  Yes     Baseline Oxygen Saturation %  96 %     1 Minute Oxygen Saturation %  90 %     1 Minute Liters of Oxygen  0 L     2 Minute Oxygen Saturation %  86 %     2 Minute Liters of Oxygen  0 L     3 Minute Oxygen Saturation %  84 %     3 Minute Liters of Oxygen  0 L     4 Minute Oxygen Saturation %  80 %     4 Minute Liters of Oxygen  2 L     5 Minute Oxygen Saturation %  92 %     5 Minute Liters of Oxygen  2 L     6 Minute Oxygen Saturation %  93 %     6 Minute Liters of Oxygen  2 L     2 Minute Post Oxygen Saturation %  97 %     2 Minute Post Liters of Oxygen  2 L        Oxygen Initial Assessment: Oxygen Initial Assessment - 09/11/19 1532      Home Oxygen   Home Oxygen Device  None    Sleep Oxygen Prescription  CPAP    Home Exercise Oxygen Prescription  Continuous    Liters per minute  2    Home at Rest Exercise Oxygen Prescription  None    Compliance with Home Oxygen Use  Yes      Initial 6 min Walk   Oxygen Used  Continuous    Liters per minute  2      Program Oxygen Prescription   Program Oxygen Prescription  Continuous    Liters per minute  2      Intervention   Short Term Goals  To learn and exhibit compliance with exercise, home and travel O2  prescription;To learn and understand importance of monitoring SPO2 with pulse oximeter and demonstrate accurate use of the pulse oximeter.;To learn and understand importance of maintaining oxygen saturations>88%;To learn and demonstrate proper pursed lip breathing techniques or other breathing techniques.;To learn and demonstrate proper use of respiratory medications    Long  Term Goals  Exhibits compliance with exercise, home and travel O2 prescription;Maintenance of O2 saturations>88%;Verbalizes importance of monitoring SPO2 with pulse oximeter and return demonstration;Exhibits proper breathing techniques, such as pursed lip breathing or other method taught during program session;Compliance with respiratory medication       Oxygen  Re-Evaluation:   Oxygen Discharge (Final Oxygen Re-Evaluation):   Initial Exercise Prescription: Initial Exercise Prescription - 09/11/19 1500      Date of Initial Exercise RX and Referring Provider   Date  09/11/19    Referring Provider  Dr. Chase Caller      Oxygen   Oxygen  Continuous    Liters  2      NuStep   Level  2    SPM  80    Minutes  15      Arm Ergometer   Level  1    Minutes  15      Prescription Details   Frequency (times per week)  2    Duration  Progress to 30 minutes of continuous aerobic without signs/symptoms of physical distress      Intensity   THRR 40-80% of Max Heartrate  61-122    Ratings of Perceived Exertion  11-13    Perceived Dyspnea  0-4      Progression   Progression  Continue progressive overload as per policy without signs/symptoms or physical distress.      Resistance Training   Training Prescription  Yes    Weight  blue bands    Reps  10-15       Perform Capillary Blood Glucose checks as needed.  Exercise Prescription Changes:   Exercise Comments:   Exercise Goals and Review: Exercise Goals    Row Name 09/11/19 1548             Exercise Goals   Increase Physical Activity  Yes        Intervention  Provide advice, education, support and counseling about physical activity/exercise needs.;Develop an individualized exercise prescription for aerobic and resistive training based on initial evaluation findings, risk stratification, comorbidities and participant's personal goals.       Expected Outcomes  Long Term: Exercising regularly at least 3-5 days a week.;Long Term: Add in home exercise to make exercise part of routine and to increase amount of physical activity.;Short Term: Attend rehab on a regular basis to increase amount of physical activity.       Increase Strength and Stamina  Yes       Intervention  Provide advice, education, support and counseling about physical activity/exercise needs.;Develop an individualized exercise prescription for aerobic and resistive training based on initial evaluation findings, risk stratification, comorbidities and participant's personal goals.       Expected Outcomes  Short Term: Increase workloads from initial exercise prescription for resistance, speed, and METs.;Short Term: Perform resistance training exercises routinely during rehab and add in resistance training at home;Long Term: Improve cardiorespiratory fitness, muscular endurance and strength as measured by increased METs and functional capacity (6MWT)       Able to understand and use rate of perceived exertion (RPE) scale  Yes       Intervention  Provide education and explanation on how to use RPE scale       Expected Outcomes  Short Term: Able to use RPE daily in rehab to express subjective intensity level;Long Term:  Able to use RPE to guide intensity level when exercising independently       Able to understand and use Dyspnea scale  Yes       Intervention  Provide education and explanation on how to use Dyspnea scale       Expected Outcomes  Short Term: Able to use Dyspnea scale daily in rehab to express subjective sense of shortness of breath during exertion;Long  Term: Able to use  Dyspnea scale to guide intensity level when exercising independently       Knowledge and understanding of Target Heart Rate Range (THRR)  Yes       Expected Outcomes  Short Term: Able to state/look up THRR;Short Term: Able to use daily as guideline for intensity in rehab;Long Term: Able to use THRR to govern intensity when exercising independently       Understanding of Exercise Prescription  Yes       Intervention  Provide education, explanation, and written materials on patient's individual exercise prescription       Expected Outcomes  Short Term: Able to explain program exercise prescription;Long Term: Able to explain home exercise prescription to exercise independently          Exercise Goals Re-Evaluation :   Discharge Exercise Prescription (Final Exercise Prescription Changes):   Nutrition:  Target Goals: Understanding of nutrition guidelines, daily intake of sodium <152m, cholesterol <2016m calories 30% from fat and 7% or less from saturated fats, daily to have 5 or more servings of fruits and vegetables.  Biometrics: Pre Biometrics - 09/11/19 1442      Pre Biometrics   Height  _0  (1.702 m)    Weight  112.2 kg    BMI (Calculated)  38.73        Nutrition Therapy Plan and Nutrition Goals:   Nutrition Assessments:   Nutrition Goals Re-Evaluation:   Nutrition Goals Discharge (Final Nutrition Goals Re-Evaluation):   Psychosocial: Target Goals: Acknowledge presence or absence of significant depression and/or stress, maximize coping skills, provide positive support system. Participant is able to verbalize types and ability to use techniques and skills needed for reducing stress and depression.  Initial Review & Psychosocial Screening: Initial Psych Review & Screening - 09/11/19 1444      Initial Review   Current issues with  None Identified      Family Dynamics   Good Support System?  Yes      Barriers   Psychosocial barriers to participate in program   There are no identifiable barriers or psychosocial needs.      Screening Interventions   Interventions  Encouraged to exercise       Quality of Life Scores:  Scores of 19 and below usually indicate a poorer quality of life in these areas.  A difference of  2-3 points is a clinically meaningful difference.  A difference of 2-3 points in the total score of the Quality of Life Index has been associated with significant improvement in overall quality of life, self-image, physical symptoms, and general health in studies assessing change in quality of life.  PHQ-9: Recent Review Flowsheet Data    Depression screen [facility] 09/11/2019 09/11/2019   Decreased Interest - 0   Down, Depressed, Hopeless 0 0   PHQ - 2 Score 0 0   Altered sleeping 0 -   Tired, decreased energy 0 -   Change in appetite 0 -   Feeling bad or failure about yourself  0 -   Trouble concentrating 0 -   Moving slowly or fidgety/restless 0 -   Suicidal thoughts 0 -   PHQ-9 Score 0 -   Difficult doing work/chores Not difficult at all -     Interpretation of Total Score  Total Score Depression Severity:  1-4 = Minimal depression, 5-9 = Mild depression, 10-14 = Moderate depression, 15-19 = Moderately severe depression, 20-27 = Severe depression   Psychosocial Evaluation and Intervention: Psychosocial  Evaluation - 09/11/19 1445      Psychosocial Evaluation & Interventions   Interventions  Encouraged to exercise with the program and follow exercise prescription    Continue Psychosocial Services   No Follow up required       Psychosocial Re-Evaluation: Psychosocial Re-Evaluation    [employer] Name 09/11/19 1445             Psychosocial Re-Evaluation   Current issues with  None Identified       Interventions  Encouraged to attend Pulmonary Rehabilitation for the exercise          Psychosocial Discharge (Final Psychosocial Re-Evaluation): Psychosocial Re-Evaluation - 09/11/19 1445      Psychosocial Re-Evaluation    Current issues with  None Identified    Interventions  Encouraged to attend Pulmonary Rehabilitation for the exercise       Education: Education Goals: Education classes will be provided on a weekly basis, covering required topics. Participant will state understanding/return demonstration of topics presented.  Learning Barriers/Preferences:   Education Topics: Risk Factor Reduction:  -Group instruction that is supported by a PowerPoint presentation. Instructor discusses the definition of a risk factor, different risk factors for pulmonary disease, and how the heart and lungs work together.     Nutrition for Pulmonary Patient:  -Group instruction provided by PowerPoint slides, verbal discussion, and written materials to support subject matter. The instructor gives an explanation and review of healthy diet recommendations, which includes a discussion on weight management, recommendations for fruit and vegetable consumption, as well as protein, fluid, caffeine, fiber, sodium, sugar, and alcohol. Tips for eating when patients are short of breath are discussed.   Pursed Lip Breathing:  -Group instruction that is supported by demonstration and informational handouts. Instructor discusses the benefits of pursed lip and diaphragmatic breathing and detailed demonstration on how to preform both.     Oxygen Safety:  -Group instruction provided by PowerPoint, verbal discussion, and written material to support subject matter. There is an overview of "What is Oxygen" and "Why do we need it".  Instructor also reviews how to create a safe environment for oxygen use, the importance of using oxygen as prescribed, and the risks of noncompliance. There is a brief discussion on traveling with oxygen and resources the patient may utilize.   Oxygen Equipment:  -Group instruction provided by [facility] Staff utilizing handouts, written materials, and equipment demonstrations.   Signs and Symptoms:  -Group  instruction provided by written material and verbal discussion to support subject matter. Warning signs and symptoms of infection, stroke, and heart attack are reviewed and when to call the physician/911 reinforced. Tips for preventing the spread of infection discussed.   Advanced Directives:  -Group instruction provided by verbal instruction and written material to support subject matter. Instructor reviews Advanced Directive laws and proper instruction for filling out document.   Pulmonary Video:  -Group video education that reviews the importance of medication and oxygen compliance, exercise, good nutrition, pulmonary hygiene, and pursed lip and diaphragmatic breathing for the pulmonary patient.   Exercise for the Pulmonary Patient:  -Group instruction that is supported by a PowerPoint presentation. Instructor discusses benefits of exercise, core components of exercise, frequency, duration, and intensity of an exercise routine, importance of utilizing pulse oximetry during exercise, safety while exercising, and options of places to exercise outside of rehab.     Pulmonary Medications:  -Verbally interactive group education provided by instructor with focus on inhaled medications and proper administration.   Anatomy and Physiology  of the Respiratory System and Intimacy:  -Group instruction provided by PowerPoint, verbal discussion, and written material to support subject matter. Instructor reviews respiratory cycle and anatomical components of the respiratory system and their functions. Instructor also reviews differences in obstructive and restrictive respiratory diseases with examples of each. Intimacy, Sex, and Sexuality differences are reviewed with a discussion on how relationships can change when diagnosed with pulmonary disease. Common sexual concerns are reviewed.   MD DAY -A group question and answer session with a medical doctor that allows participants to ask questions that  relate to their pulmonary disease state.   OTHER EDUCATION -Group or individual verbal, written, or video instructions that support the educational goals of the pulmonary rehab program.   Holiday Eating Survival Tips:  -Group instruction provided by PowerPoint slides, verbal discussion, and written materials to support subject matter. The instructor gives patients tips, tricks, and techniques to help them not only survive but enjoy the holidays despite the onslaught of food that accompanies the holidays.   Knowledge Questionnaire Score: Knowledge Questionnaire Score - 09/11/19 1513      Knowledge Questionnaire Score   Pre Score  8/18       Core Components/Risk Factors/Patient Goals at Admission: Personal Goals and Risk Factors at Admission - 09/11/19 1445      Core Components/Risk Factors/Patient Goals on Admission   Improve shortness of breath with ADL's  Yes    Intervention  Provide education, individualized exercise plan and daily activity instruction to help decrease symptoms of SOB with activities of daily living.    Expected Outcomes  Short Term: Improve cardiorespiratory fitness to achieve a reduction of symptoms when performing ADLs;Long Term: Be able to perform more ADLs without symptoms or delay the onset of symptoms       Core Components/Risk Factors/Patient Goals Review:  Goals and Risk Factor Review    Row Name 09/11/19 1445             Core Components/Risk Factors/Patient Goals Review   Personal Goals Review  Develop more efficient breathing techniques such as purse lipped breathing and diaphragmatic breathing and practicing self-pacing with activity.;Increase knowledge of respiratory medications and ability to use respiratory devices properly.;Improve shortness of breath with ADL's  (Pended)           Core Components/Risk Factors/Patient Goals at Discharge (Final Review):  Goals and Risk Factor Review - 09/11/19 1445      Core Components/Risk  Factors/Patient Goals Review   Personal Goals Review  Develop more efficient breathing techniques such as purse lipped breathing and diaphragmatic breathing and practicing self-pacing with activity.;Increase knowledge of respiratory medications and ability to use respiratory devices properly.;Improve shortness of breath with ADL's  (Pended)        ITP Comments:   Comments:

## 2019-09-11 NOTE — Telephone Encounter (Signed)
Rehab closed call 09/12/19

## 2019-09-11 NOTE — Progress Notes (Signed)
Stephen [name] 68 y.o. male Pulmonary Rehab Orientation Note Patient arrived today in Cardiac and Pulmonary Rehab for orientation to Pulmonary Rehab. He walked from the [place] lot with a cane due to balance issues after an accident in 2002. He does not carry portable oxygen. Per pt, he uses oxygen never. He completed a 6 minute walk test today and desaturated to 80 % on RA during the test and required 2 L of oxygen.  Patient states his oxygen levels were measured while he was asleep at home one week ago. The results are not back yet.  He has a pulse oximenter at home and states that his oxygen level drops to 79% when he gets out of the shower. Color good, skin warm and dry. Patient is oriented to time and place. Patient's medical history, psychosocial health, and medications reviewed. Psychosocial assessment reveals pt lives with their spouse. Pt is currently retired. Pt hobbies include watching television. Pt reports his stress level is low.  Pt does not exhibit signs of depression. PHQ2/9 score 0/0. Pt shows good  coping skills with positive outlook . Will continue to monitor and evaluate progress toward psychosocial goal(s) of no psychosocial concerns or barriers while in pulmonary rehab. Physical assessment reveals heart rate is normal, breath sounds clear to auscultation, no wheezes, rales, or rhonchi. Grip strength equal, strong. Patient reports he does take medications as prescribed. Patient states he follows a Regular diet. The patient reports no specific efforts to gain or lose weight.. Patient's weight will be monitored closely. Demonstration and practice of PLB using pulse oximeter. Patient able to return demonstration satisfactorily. Safety and hand hygiene in the exercise area reviewed with patient. Patient voices understanding of the information reviewed. Department expectations discussed with patient and achievable goals were set. The patient shows enthusiasm about attending the program and  we look forward to working with this nice gentleman. The patient completed a 6 min walk test today and to begin exercise on Tuesday, September 16, 2019 in the 1345 class.  [phone]

## 2019-09-12 NOTE — Telephone Encounter (Signed)
Yes for both  Thanks    SIGNATURE    Dr. Brand Males, M.D., F.C.C.P,  Pulmonary and Critical Care Medicine Staff Physician, [place] Director - Interstitial Lung Disease  Program  [facility] [place], [place], [address]  Pager: [phone], If no answer or between  15:00h - 7:00h: call [phone] Telephone: [phone]  2:32 PM 09/12/2019

## 2019-09-12 NOTE — Telephone Encounter (Signed)
Reviewed ONBO of Stephen [name] done d11/62020 - pulse x drop < 88% for 7.66mn.   Also, bradycardiac at night - 46-76/min with average pulse 52/mn and 97% time in bradycardia . He is on metoprolol  Plan - given progressive IPF - start 1L [place] at night  - please ask him if he has OSA and uses CPAP  - please let him know to touch base with his PCP HRonita Hipps MD or cardiologist about his night bradycardia.Probably ok but he needs to check jsut in case

## 2019-09-12 NOTE — Telephone Encounter (Signed)
I called and spoke with the pt and notified him of everything  He states that he is using CPAP  He was on CPAP the night of his ONO- it was ordered to be done on RA, just want to be sure that you still want the o2 with sleep before I order, thanks

## 2019-09-12 NOTE — Telephone Encounter (Signed)
MR- before I call this pt I want to let you know I spoke with Stephen [name] at pulmonary rehab and he states that the pt's sats dropped to 80% ra with exertion so they put him on 2lpm o2 with exercise and he was able to maintain sats above 90%. So can we go ahead and order 2lpm o2 with exertion as well as the 1lpm o2 with sleep? thanks

## 2019-09-16 ENCOUNTER — Other Ambulatory Visit: Payer: Self-pay

## 2019-09-16 ENCOUNTER — Encounter (HOSPITAL_COMMUNITY)
Admission: RE | Admit: 2019-09-16 | Discharge: 2019-09-16 | Disposition: A | Discharge status: Home / Self Care | Payer: PPO | Source: Ambulatory Visit | Attending: Internal Medicine | Admitting: Internal Medicine

## 2019-09-16 VITALS — Wt 249.3 lb

## 2019-09-16 DIAGNOSIS — J84112 Idiopathic pulmonary fibrosis: Secondary | ICD-10-CM

## 2019-09-16 NOTE — Progress Notes (Signed)
Pulmonary Individual Treatment Plan  Patient Details  Name: Stephen [name]  MRN: [id number] Date of Birth: 04-Oct-1951 Referring Provider:     Pulmonary Rehab Walk Test from 09/11/2019 in [facility] [facility]  Referring Provider  Dr. Chase Caller      Initial Encounter Date:    Pulmonary Rehab Walk Test from 09/11/2019 in [place]  Date  09/11/19      Visit Diagnosis: Idiopathic pulmonary fibrosis ([place])  Patient's Home Medications on Admission:   Current Outpatient Medications:  .  amitriptyline (ELAVIL) 75 MG tablet, Take 75 mg by mouth at bedtime., Disp: , Rfl:  .  amLODipine (NORVASC) 10 MG tablet, Take 10 mg by mouth daily., Disp: , Rfl:  .  aspirin EC 81 MG tablet, Take 81 mg by mouth daily., Disp: , Rfl:  .  calcium carbonate (TUMS - DOSED IN MG ELEMENTAL CALCIUM) 500 MG chewable tablet, Chew 1 tablet by mouth daily., Disp: , Rfl:  .  cholecalciferol (VITAMIN D) 1000 units tablet, Take 1,000 Units by mouth daily., Disp: , Rfl:  .  cloNIDine (CATAPRES) 0.3 MG tablet, Take 0.3 mg by mouth daily after supper. , Disp: , Rfl:  .  dicyclomine (BENTYL) 10 MG capsule, Take 10 mg by mouth 4 (four) times daily -  before meals and at bedtime., Disp: , Rfl:  .  lansoprazole (PREVACID) 30 MG capsule, Take 30 mg by mouth daily at 12 noon., Disp: , Rfl:  .  Methylcellulose, Laxative, (CITRUCEL PO), Take 1 Scoop by mouth., Disp: , Rfl:  .  metoprolol tartrate (LOPRESSOR) 50 MG tablet, Take 1 tablet by mouth 2 (two) times daily., Disp: , Rfl:  .  montelukast (SINGULAIR) 10 MG tablet, Take 10 mg by mouth at bedtime., Disp: , Rfl:  .  Multiple Vitamin (MULTIVITAMIN WITH MINERALS) TABS tablet, Take 1 tablet by mouth daily., Disp: , Rfl:  .  nabumetone (RELAFEN) 750 MG tablet, Take 750 mg by mouth 2 (two) times daily., Disp: , Rfl:  .  olmesartan (BENICAR) 20 MG tablet, Take 1 tablet by mouth daily., Disp: , Rfl:  .  Pirfenidone (ESBRIET) 801 MG  TABS, Take 1 tablet by mouth 3 (three) times daily., Disp: 270 tablet, Rfl: 0 .  polyethylene glycol (MIRALAX / GLYCOLAX) packet, Take 17 g by mouth daily., Disp: , Rfl:  .  Probiotic Product (PROBIOTIC DAILY PO), Take 1 capsule by mouth daily., Disp: , Rfl:  .  rosuvastatin (CRESTOR) 10 MG tablet, Take 10 mg by mouth daily., Disp: , Rfl:  .  sodium chloride 1 g tablet, Take 1 g by mouth 3 (three) times daily., Disp: , Rfl:  .  spironolactone (ALDACTONE) 25 MG tablet, Take 25 mg by mouth 3 (three) times a week., Disp: , Rfl:  .  UNABLE TO FIND, Take 1 capsule by mouth 3 (three) times daily. Med Name: blood sugar harmony, Disp: , Rfl:  .  UNABLE TO FIND, Take 1 capsule by mouth 3 (three) times daily. Med Name: Clear lungs, Disp: , Rfl:  .  vitamin B-12 (CYANOCOBALAMIN) 1000 MCG tablet, Take 1,000 mcg by mouth daily., Disp: , Rfl:  .  vitamin E 400 UNIT capsule, Take 400 Units by mouth daily., Disp: , Rfl:  .  calcium carbonate (OSCAL) 1500 (600 Ca) MG TABS tablet, Take by mouth 2 (two) times daily with a meal., Disp: , Rfl:   Past Medical History: Past Medical History:  Diagnosis Date  . Allergy   .  Arthritis   . GERD (gastroesophageal reflux disease)   . Heart murmur   . Hiatal hernia   . Hyperlipidemia   . Hypertension   . Sleep apnea   . Vertigo     Tobacco Use: Social History   Tobacco Use  Smoking Status Never Smoker  Smokeless Tobacco Never Used    Labs: Recent Review Flowsheet Data    There is no flowsheet data to display.      Capillary Blood Glucose: No results found for: GLUCAP   Pulmonary Assessment Scores: Pulmonary Assessment Scores    Row Name 09/11/19 1515         ADL UCSD   ADL Phase  Entry     SOB Score total  38       CAT Score   CAT Score  17       UCSD: Self-administered rating of dyspnea associated with activities of daily living (ADLs) 6-point scale (0 = "not at all" to 5 = "maximal or unable to do because of breathlessness")  Scoring  Scores range from 0 to 120.  Minimally important difference is 5 units  CAT: CAT can identify the health impairment of COPD patients and is better correlated with disease progression.  CAT has a scoring range of zero to 40. The CAT score is classified into four groups of low (less than 10), medium (10 - 20), high (21-30) and very high (31-40) based on the impact level of disease on health status. A CAT score over 10 suggests significant symptoms.  A worsening CAT score could be explained by an exacerbation, poor medication adherence, poor inhaler technique, or progression of COPD or comorbid conditions.  CAT MCID is 2 points  mMRC: mMRC (Modified Medical Research Council) Dyspnea Scale is used to assess the degree of baseline functional disability in patients of respiratory disease due to dyspnea. No minimal important difference is established. A decrease in score of 1 point or greater is considered a positive change.   Pulmonary Function Assessment: Pulmonary Function Assessment - 09/11/19 1443      Breath   Bilateral Breath Sounds  Clear    Shortness of Breath  Yes;Limiting activity       Exercise Target Goals: Exercise Program Goal: Individual exercise prescription set using results from initial 6 min walk test and THRR while considering  patient's activity barriers and safety.   Exercise Prescription Goal: Initial exercise prescription builds to 30-45 minutes a day of aerobic activity, 2-3 days per week.  Home exercise guidelines will be given to patient during program as part of exercise prescription that the participant will acknowledge.  Activity Barriers & Risk Stratification: Activity Barriers & Cardiac Risk Stratification - 09/11/19 1441      Activity Barriers & Cardiac Risk Stratification   Activity Barriers  Arthritis;Deconditioning;Muscular Weakness;Shortness of Breath       6 Minute Walk: 6 Minute Walk    Row Name 09/11/19 1533         6 Minute Walk   Phase   Initial     Distance  600 feet     Walk Time  5.25 minutes     # of Rest Breaks  1     MPH  1.14     METS  1.34     RPE  13     Perceived Dyspnea   2     VO2 Peak  4.69     Symptoms  Yes (comment)     Comments  45  sec standing break due to desaturation     Resting HR  78 bpm     Resting BP  134/80     Resting Oxygen Saturation   96 %     Exercise Oxygen Saturation  during 6 min walk  80 %     Max Ex. HR  104 bpm     Max Ex. BP  152/82     2 Minute Post BP  132/84       Interval HR   1 Minute HR  97     2 Minute HR  101     3 Minute HR  104     4 Minute HR  93     5 Minute HR  94     6 Minute HR  96     2 Minute Post HR  91     Interval Heart Rate?  Yes       Interval Oxygen   Interval Oxygen?  Yes     Baseline Oxygen Saturation %  96 %     1 Minute Oxygen Saturation %  90 %     1 Minute Liters of Oxygen  0 L     2 Minute Oxygen Saturation %  86 %     2 Minute Liters of Oxygen  0 L     3 Minute Oxygen Saturation %  84 %     3 Minute Liters of Oxygen  0 L     4 Minute Oxygen Saturation %  80 %     4 Minute Liters of Oxygen  2 L     5 Minute Oxygen Saturation %  92 %     5 Minute Liters of Oxygen  2 L     6 Minute Oxygen Saturation %  93 %     6 Minute Liters of Oxygen  2 L     2 Minute Post Oxygen Saturation %  97 %     2 Minute Post Liters of Oxygen  2 L        Oxygen Initial Assessment: Oxygen Initial Assessment - 09/11/19 1532      Home Oxygen   Home Oxygen Device  None    Sleep Oxygen Prescription  CPAP    Home Exercise Oxygen Prescription  Continuous    Liters per minute  2    Home at Rest Exercise Oxygen Prescription  None    Compliance with Home Oxygen Use  Yes      Initial 6 min Walk   Oxygen Used  Continuous    Liters per minute  2      Program Oxygen Prescription   Program Oxygen Prescription  Continuous    Liters per minute  2      Intervention   Short Term Goals  To learn and exhibit compliance with exercise, home and travel O2  prescription;To learn and understand importance of monitoring SPO2 with pulse oximeter and demonstrate accurate use of the pulse oximeter.;To learn and understand importance of maintaining oxygen saturations>88%;To learn and demonstrate proper pursed lip breathing techniques or other breathing techniques.;To learn and demonstrate proper use of respiratory medications    Long  Term Goals  Exhibits compliance with exercise, home and travel O2 prescription;Maintenance of O2 saturations>88%;Verbalizes importance of monitoring SPO2 with pulse oximeter and return demonstration;Exhibits proper breathing techniques, such as pursed lip breathing or other method taught during program session;Compliance with respiratory medication       Oxygen  Re-Evaluation:   Oxygen Discharge (Final Oxygen Re-Evaluation):   Initial Exercise Prescription: Initial Exercise Prescription - 09/11/19 1500      Date of Initial Exercise RX and Referring Provider   Date  09/11/19    Referring Provider  Dr. Chase Caller      Oxygen   Oxygen  Continuous    Liters  2      NuStep   Level  2    SPM  80    Minutes  15      Arm Ergometer   Level  1    Minutes  15      Prescription Details   Frequency (times per week)  2    Duration  Progress to 30 minutes of continuous aerobic without signs/symptoms of physical distress      Intensity   THRR 40-80% of Max Heartrate  61-122    Ratings of Perceived Exertion  11-13    Perceived Dyspnea  0-4      Progression   Progression  Continue progressive overload as per policy without signs/symptoms or physical distress.      Resistance Training   Training Prescription  Yes    Weight  blue bands    Reps  10-15       Perform Capillary Blood Glucose checks as needed.  Exercise Prescription Changes:   Exercise Comments:   Exercise Goals and Review: Exercise Goals    Row Name 09/11/19 1548             Exercise Goals   Increase Physical Activity  Yes        Intervention  Provide advice, education, support and counseling about physical activity/exercise needs.;Develop an individualized exercise prescription for aerobic and resistive training based on initial evaluation findings, risk stratification, comorbidities and participant's personal goals.       Expected Outcomes  Long Term: Exercising regularly at least 3-5 days a week.;Long Term: Add in home exercise to make exercise part of routine and to increase amount of physical activity.;Short Term: Attend rehab on a regular basis to increase amount of physical activity.       Increase Strength and Stamina  Yes       Intervention  Provide advice, education, support and counseling about physical activity/exercise needs.;Develop an individualized exercise prescription for aerobic and resistive training based on initial evaluation findings, risk stratification, comorbidities and participant's personal goals.       Expected Outcomes  Short Term: Increase workloads from initial exercise prescription for resistance, speed, and METs.;Short Term: Perform resistance training exercises routinely during rehab and add in resistance training at home;Long Term: Improve cardiorespiratory fitness, muscular endurance and strength as measured by increased METs and functional capacity (6MWT)       Able to understand and use rate of perceived exertion (RPE) scale  Yes       Intervention  Provide education and explanation on how to use RPE scale       Expected Outcomes  Short Term: Able to use RPE daily in rehab to express subjective intensity level;Long Term:  Able to use RPE to guide intensity level when exercising independently       Able to understand and use Dyspnea scale  Yes       Intervention  Provide education and explanation on how to use Dyspnea scale       Expected Outcomes  Short Term: Able to use Dyspnea scale daily in rehab to express subjective sense of shortness of breath during exertion;Long  Term: Able to use  Dyspnea scale to guide intensity level when exercising independently       Knowledge and understanding of Target Heart Rate Range (THRR)  Yes       Expected Outcomes  Short Term: Able to state/look up THRR;Short Term: Able to use daily as guideline for intensity in rehab;Long Term: Able to use THRR to govern intensity when exercising independently       Understanding of Exercise Prescription  Yes       Intervention  Provide education, explanation, and written materials on patient's individual exercise prescription       Expected Outcomes  Short Term: Able to explain program exercise prescription;Long Term: Able to explain home exercise prescription to exercise independently          Exercise Goals Re-Evaluation :   Discharge Exercise Prescription (Final Exercise Prescription Changes):   Nutrition:  Target Goals: Understanding of nutrition guidelines, daily intake of sodium <152m, cholesterol <2016m calories 30% from fat and 7% or less from saturated fats, daily to have 5 or more servings of fruits and vegetables.  Biometrics: Pre Biometrics - 09/11/19 1442      Pre Biometrics   Height  _0  (1.702 m)    Weight  112.2 kg    BMI (Calculated)  38.73        Nutrition Therapy Plan and Nutrition Goals:   Nutrition Assessments:   Nutrition Goals Re-Evaluation:   Nutrition Goals Discharge (Final Nutrition Goals Re-Evaluation):   Psychosocial: Target Goals: Acknowledge presence or absence of significant depression and/or stress, maximize coping skills, provide positive support system. Participant is able to verbalize types and ability to use techniques and skills needed for reducing stress and depression.  Initial Review & Psychosocial Screening: Initial Psych Review & Screening - 09/11/19 1444      Initial Review   Current issues with  None Identified      Family Dynamics   Good Support System?  Yes      Barriers   Psychosocial barriers to participate in program   There are no identifiable barriers or psychosocial needs.      Screening Interventions   Interventions  Encouraged to exercise       Quality of Life Scores:  Scores of 19 and below usually indicate a poorer quality of life in these areas.  A difference of  2-3 points is a clinically meaningful difference.  A difference of 2-3 points in the total score of the Quality of Life Index has been associated with significant improvement in overall quality of life, self-image, physical symptoms, and general health in studies assessing change in quality of life.  PHQ-9: Recent Review Flowsheet Data    Depression screen [facility] 09/11/2019 09/11/2019   Decreased Interest - 0   Down, Depressed, Hopeless 0 0   PHQ - 2 Score 0 0   Altered sleeping 0 -   Tired, decreased energy 0 -   Change in appetite 0 -   Feeling bad or failure about yourself  0 -   Trouble concentrating 0 -   Moving slowly or fidgety/restless 0 -   Suicidal thoughts 0 -   PHQ-9 Score 0 -   Difficult doing work/chores Not difficult at all -     Interpretation of Total Score  Total Score Depression Severity:  1-4 = Minimal depression, 5-9 = Mild depression, 10-14 = Moderate depression, 15-19 = Moderately severe depression, 20-27 = Severe depression   Psychosocial Evaluation and Intervention: Psychosocial  Evaluation - 09/11/19 1445      Psychosocial Evaluation & Interventions   Interventions  Encouraged to exercise with the program and follow exercise prescription    Continue Psychosocial Services   No Follow up required       Psychosocial Re-Evaluation: Psychosocial Re-Evaluation    [employer] Name 09/11/19 1445             Psychosocial Re-Evaluation   Current issues with  None Identified       Interventions  Encouraged to attend Pulmonary Rehabilitation for the exercise          Psychosocial Discharge (Final Psychosocial Re-Evaluation): Psychosocial Re-Evaluation - 09/11/19 1445      Psychosocial Re-Evaluation    Current issues with  None Identified    Interventions  Encouraged to attend Pulmonary Rehabilitation for the exercise       Education: Education Goals: Education classes will be provided on a weekly basis, covering required topics. Participant will state understanding/return demonstration of topics presented.  Learning Barriers/Preferences:   Education Topics: Risk Factor Reduction:  -Group instruction that is supported by a PowerPoint presentation. Instructor discusses the definition of a risk factor, different risk factors for pulmonary disease, and how the heart and lungs work together.     Nutrition for Pulmonary Patient:  -Group instruction provided by PowerPoint slides, verbal discussion, and written materials to support subject matter. The instructor gives an explanation and review of healthy diet recommendations, which includes a discussion on weight management, recommendations for fruit and vegetable consumption, as well as protein, fluid, caffeine, fiber, sodium, sugar, and alcohol. Tips for eating when patients are short of breath are discussed.   Pursed Lip Breathing:  -Group instruction that is supported by demonstration and informational handouts. Instructor discusses the benefits of pursed lip and diaphragmatic breathing and detailed demonstration on how to preform both.     Oxygen Safety:  -Group instruction provided by PowerPoint, verbal discussion, and written material to support subject matter. There is an overview of "What is Oxygen" and "Why do we need it".  Instructor also reviews how to create a safe environment for oxygen use, the importance of using oxygen as prescribed, and the risks of noncompliance. There is a brief discussion on traveling with oxygen and resources the patient may utilize.   Oxygen Equipment:  -Group instruction provided by [facility] Staff utilizing handouts, written materials, and equipment demonstrations.   Signs and Symptoms:  -Group  instruction provided by written material and verbal discussion to support subject matter. Warning signs and symptoms of infection, stroke, and heart attack are reviewed and when to call the physician/911 reinforced. Tips for preventing the spread of infection discussed.   Advanced Directives:  -Group instruction provided by verbal instruction and written material to support subject matter. Instructor reviews Advanced Directive laws and proper instruction for filling out document.   Pulmonary Video:  -Group video education that reviews the importance of medication and oxygen compliance, exercise, good nutrition, pulmonary hygiene, and pursed lip and diaphragmatic breathing for the pulmonary patient.   Exercise for the Pulmonary Patient:  -Group instruction that is supported by a PowerPoint presentation. Instructor discusses benefits of exercise, core components of exercise, frequency, duration, and intensity of an exercise routine, importance of utilizing pulse oximetry during exercise, safety while exercising, and options of places to exercise outside of rehab.     Pulmonary Medications:  -Verbally interactive group education provided by instructor with focus on inhaled medications and proper administration.   Anatomy and Physiology  of the Respiratory System and Intimacy:  -Group instruction provided by PowerPoint, verbal discussion, and written material to support subject matter. Instructor reviews respiratory cycle and anatomical components of the respiratory system and their functions. Instructor also reviews differences in obstructive and restrictive respiratory diseases with examples of each. Intimacy, Sex, and Sexuality differences are reviewed with a discussion on how relationships can change when diagnosed with pulmonary disease. Common sexual concerns are reviewed.   MD DAY -A group question and answer session with a medical doctor that allows participants to ask questions that  relate to their pulmonary disease state.   OTHER EDUCATION -Group or individual verbal, written, or video instructions that support the educational goals of the pulmonary rehab program.   Holiday Eating Survival Tips:  -Group instruction provided by PowerPoint slides, verbal discussion, and written materials to support subject matter. The instructor gives patients tips, tricks, and techniques to help them not only survive but enjoy the holidays despite the onslaught of food that accompanies the holidays.   Knowledge Questionnaire Score: Knowledge Questionnaire Score - 09/11/19 1513      Knowledge Questionnaire Score   Pre Score  8/18       Core Components/Risk Factors/Patient Goals at Admission: Personal Goals and Risk Factors at Admission - 09/11/19 1445      Core Components/Risk Factors/Patient Goals on Admission   Improve shortness of breath with ADL's  Yes    Intervention  Provide education, individualized exercise plan and daily activity instruction to help decrease symptoms of SOB with activities of daily living.    Expected Outcomes  Short Term: Improve cardiorespiratory fitness to achieve a reduction of symptoms when performing ADLs;Long Term: Be able to perform more ADLs without symptoms or delay the onset of symptoms       Core Components/Risk Factors/Patient Goals Review:  Goals and Risk Factor Review    Row Name 09/11/19 1445             Core Components/Risk Factors/Patient Goals Review   Personal Goals Review  Develop more efficient breathing techniques such as purse lipped breathing and diaphragmatic breathing and practicing self-pacing with activity.;Increase knowledge of respiratory medications and ability to use respiratory devices properly.;Improve shortness of breath with ADL's  (Pended)           Core Components/Risk Factors/Patient Goals at Discharge (Final Review):  Goals and Risk Factor Review - 09/11/19 1445      Core Components/Risk  Factors/Patient Goals Review   Personal Goals Review  Develop more efficient breathing techniques such as purse lipped breathing and diaphragmatic breathing and practicing self-pacing with activity.;Increase knowledge of respiratory medications and ability to use respiratory devices properly.;Improve shortness of breath with ADL's  (Pended)        ITP Comments:   Comments:

## 2019-09-16 NOTE — Telephone Encounter (Signed)
MR - please advise. Thanks.

## 2019-09-16 NOTE — Progress Notes (Signed)
Daily Session Note  Patient Details  Name: Stephen [name] MRN: [id number] Date of Birth: Feb 02, 1951 Referring Provider:     Pulmonary Rehab Walk Test from 09/11/2019 in [place]  Referring Provider  Dr. Chase Caller      Encounter Date: 09/16/2019  Check In: Session Check In - 09/16/19 1503      Check-In   Supervising physician immediately available to respond to emergencies  [facility] immediately available    Physician(s)  Dr. Doristine Bosworth    Location  [facility]    Staff Present  Rosebud Poles, RN, Bjorn Loser, MS, Exercise Physiologist;Lisa Ysidro Evert, RN    Virtual Visit  No    Medication changes reported      No    Fall or balance concerns reported     No    Tobacco Cessation  No Change    Warm-up and Cool-down  Performed as group-led instruction    Resistance Training Performed  Yes    VAD Patient?  No    PAD/SET Patient?  No      Pain Assessment   Currently in Pain?  No/denies    Multiple Pain Sites  No       Capillary Blood Glucose: No results found for this or any previous visit (from the past 24 hour(s)).  Exercise Prescription Changes - 09/16/19 1500      Response to Exercise   Blood Pressure (Admit)  140/80    Blood Pressure (Exercise)  144/80    Blood Pressure (Exit)  140/82    Heart Rate (Admit)  73 bpm    Heart Rate (Exercise)  84 bpm    Heart Rate (Exit)  79 bpm    Oxygen Saturation (Admit)  97 %    Oxygen Saturation (Exercise)  97 %    Oxygen Saturation (Exit)  98 %    Rating of Perceived Exertion (Exercise)  12    Perceived Dyspnea (Exercise)  2    Duration  Continue with 30 min of aerobic exercise without signs/symptoms of physical distress.    Intensity  THRR unchanged      Progression   Progression  Continue to progress workloads to maintain intensity without signs/symptoms of physical distress.      Resistance Training   Training Prescription  Yes    Weight  blue bands    Reps   10-15    Time  10 Minutes      Oxygen   Oxygen  Continuous    Liters  2      NuStep   Level  2    SPM  80    Minutes  15    METs  1.7      Arm Ergometer   Level  1    Minutes  15       Social History   Tobacco Use  Smoking Status Never Smoker  Smokeless Tobacco Never Used    Goals Met:  Independence with exercise equipment Exercise tolerated well Strength training completed today  Goals Unmet:  Not Applicable  Comments: Service time is from 1330 to 1440    Dr. Rush Farmer is Medical Director for Pulmonary Rehab at [facility].

## 2019-09-16 NOTE — Progress Notes (Signed)
Pulmonary Individual Treatment Plan  Patient Details  Name: Stephen [name] MRN: [id number] Date of Birth: [birth year] Referring Provider:     Pulmonary Rehab Walk Test from 09/11/2019 in [facility]  Referring Provider  Dr. Chase Caller      Initial Encounter Date:    Pulmonary Rehab Walk Test from 09/11/2019 in [facility]  Date  09/11/19      Visit Diagnosis: Idiopathic pulmonary fibrosis ([place])  Patient's Home Medications on Admission:   Current Outpatient Medications:  .  amitriptyline (ELAVIL) 75 MG tablet, Take 75 mg by mouth at bedtime., Disp: , Rfl:  .  amLODipine (NORVASC) 10 MG tablet, Take 10 mg by mouth daily., Disp: , Rfl:  .  aspirin EC 81 MG tablet, Take 81 mg by mouth daily., Disp: , Rfl:  .  calcium carbonate (OSCAL) 1500 (600 Ca) MG TABS tablet, Take by mouth 2 (two) times daily with a meal., Disp: , Rfl:  .  calcium carbonate (TUMS - DOSED IN MG ELEMENTAL CALCIUM) 500 MG chewable tablet, Chew 1 tablet by mouth daily., Disp: , Rfl:  .  cholecalciferol (VITAMIN D) 1000 units tablet, Take 1,000 Units by mouth daily., Disp: , Rfl:  .  cloNIDine (CATAPRES) 0.3 MG tablet, Take 0.3 mg by mouth daily after supper. , Disp: , Rfl:  .  dicyclomine (BENTYL) 10 MG capsule, Take 10 mg by mouth 4 (four) times daily -  before meals and at bedtime., Disp: , Rfl:  .  lansoprazole (PREVACID) 30 MG capsule, Take 30 mg by mouth daily at 12 noon., Disp: , Rfl:  .  Methylcellulose, Laxative, (CITRUCEL PO), Take 1 Scoop by mouth., Disp: , Rfl:  .  metoprolol tartrate (LOPRESSOR) 50 MG tablet, Take 1 tablet by mouth 2 (two) times daily., Disp: , Rfl:  .  montelukast (SINGULAIR) 10 MG tablet, Take 10 mg by mouth at bedtime., Disp: , Rfl:  .  Multiple Vitamin (MULTIVITAMIN WITH MINERALS) TABS tablet, Take 1 tablet by mouth daily., Disp: , Rfl:  .  nabumetone (RELAFEN) 750 MG tablet, Take 750 mg by mouth 2 (two) times daily., Disp: ,  Rfl:  .  olmesartan (BENICAR) 20 MG tablet, Take 1 tablet by mouth daily., Disp: , Rfl:  .  Pirfenidone (ESBRIET) 801 MG TABS, Take 1 tablet by mouth 3 (three) times daily., Disp: 270 tablet, Rfl: 0 .  polyethylene glycol (MIRALAX / GLYCOLAX) packet, Take 17 g by mouth daily., Disp: , Rfl:  .  Probiotic Product (PROBIOTIC DAILY PO), Take 1 capsule by mouth daily., Disp: , Rfl:  .  rosuvastatin (CRESTOR) 10 MG tablet, Take 10 mg by mouth daily., Disp: , Rfl:  .  sodium chloride 1 g tablet, Take 1 g by mouth 3 (three) times daily., Disp: , Rfl:  .  spironolactone (ALDACTONE) 25 MG tablet, Take 25 mg by mouth 3 (three) times a week., Disp: , Rfl:  .  UNABLE TO FIND, Take 1 capsule by mouth 3 (three) times daily. Med Name: blood sugar harmony, Disp: , Rfl:  .  UNABLE TO FIND, Take 1 capsule by mouth 3 (three) times daily. Med Name: Clear lungs, Disp: , Rfl:  .  vitamin B-12 (CYANOCOBALAMIN) 1000 MCG tablet, Take 1,000 mcg by mouth daily., Disp: , Rfl:  .  vitamin E 400 UNIT capsule, Take 400 Units by mouth daily., Disp: , Rfl:   Past Medical History: Past Medical History:  Diagnosis Date  . Allergy   .  Arthritis   . GERD (gastroesophageal reflux disease)   . Heart murmur   . Hiatal hernia   . Hyperlipidemia   . Hypertension   . Sleep apnea   . Vertigo     Tobacco Use: Social History   Tobacco Use  Smoking Status Never Smoker  Smokeless Tobacco Never Used    Labs: Recent Review Flowsheet Data    There is no flowsheet data to display.      Capillary Blood Glucose: No results found for: GLUCAP   Pulmonary Assessment Scores: Pulmonary Assessment Scores    Row Name 09/11/19 1515         ADL UCSD   ADL Phase  Entry     SOB Score total  38       CAT Score   CAT Score  17       UCSD: Self-administered rating of dyspnea associated with activities of daily living (ADLs) 6-point scale (0 = "not at all" to 5 = "maximal or unable to do because of breathlessness")  Scoring  Scores range from 0 to 120.  Minimally important difference is 5 units  CAT: CAT can identify the health impairment of COPD patients and is better correlated with disease progression.  CAT has a scoring range of zero to 40. The CAT score is classified into four groups of low (less than 10), medium (10 - 20), high (21-30) and very high (31-40) based on the impact level of disease on health status. A CAT score over 10 suggests significant symptoms.  A worsening CAT score could be explained by an exacerbation, poor medication adherence, poor inhaler technique, or progression of COPD or comorbid conditions.  CAT MCID is 2 points  mMRC: mMRC (Modified Medical Research Council) Dyspnea Scale is used to assess the degree of baseline functional disability in patients of respiratory disease due to dyspnea. No minimal important difference is established. A decrease in score of 1 point or greater is considered a positive change.   Pulmonary Function Assessment: Pulmonary Function Assessment - 09/11/19 1443      Breath   Bilateral Breath Sounds  Clear    Shortness of Breath  Yes;Limiting activity       Exercise Target Goals: Exercise Program Goal: Individual exercise prescription set using results from initial 6 min walk test and THRR while considering  patient's activity barriers and safety.   Exercise Prescription Goal: Initial exercise prescription builds to 30-45 minutes a day of aerobic activity, 2-3 days per week.  Home exercise guidelines will be given to patient during program as part of exercise prescription that the participant will acknowledge.  Activity Barriers & Risk Stratification: Activity Barriers & Cardiac Risk Stratification - 09/11/19 1441      Activity Barriers & Cardiac Risk Stratification   Activity Barriers  Arthritis;Deconditioning;Muscular Weakness;Shortness of Breath       6 Minute Walk: 6 Minute Walk    Row Name 09/11/19 1533         6 Minute Walk   Phase   Initial     Distance  600 feet     Walk Time  5.25 minutes     # of Rest Breaks  1     MPH  1.14     METS  1.34     RPE  13     Perceived Dyspnea   2     VO2 Peak  4.69     Symptoms  Yes (comment)     Comments  45  sec standing break due to desaturation     Resting HR  78 bpm     Resting BP  134/80     Resting Oxygen Saturation   96 %     Exercise Oxygen Saturation  during 6 min walk  80 %     Max Ex. HR  104 bpm     Max Ex. BP  152/82     2 Minute Post BP  132/84       Interval HR   1 Minute HR  97     2 Minute HR  101     3 Minute HR  104     4 Minute HR  93     5 Minute HR  94     6 Minute HR  96     2 Minute Post HR  91     Interval Heart Rate?  Yes       Interval Oxygen   Interval Oxygen?  Yes     Baseline Oxygen Saturation %  96 %     1 Minute Oxygen Saturation %  90 %     1 Minute Liters of Oxygen  0 L     2 Minute Oxygen Saturation %  86 %     2 Minute Liters of Oxygen  0 L     3 Minute Oxygen Saturation %  84 %     3 Minute Liters of Oxygen  0 L     4 Minute Oxygen Saturation %  80 %     4 Minute Liters of Oxygen  2 L     5 Minute Oxygen Saturation %  92 %     5 Minute Liters of Oxygen  2 L     6 Minute Oxygen Saturation %  93 %     6 Minute Liters of Oxygen  2 L     2 Minute Post Oxygen Saturation %  97 %     2 Minute Post Liters of Oxygen  2 L        Oxygen Initial Assessment: Oxygen Initial Assessment - 09/11/19 1532      Home Oxygen   Home Oxygen Device  None    Sleep Oxygen Prescription  CPAP    Home Exercise Oxygen Prescription  Continuous    Liters per minute  2    Home at Rest Exercise Oxygen Prescription  None    Compliance with Home Oxygen Use  Yes      Initial 6 min Walk   Oxygen Used  Continuous    Liters per minute  2      Program Oxygen Prescription   Program Oxygen Prescription  Continuous    Liters per minute  2      Intervention   Short Term Goals  To learn and exhibit compliance with exercise, home and travel O2  prescription;To learn and understand importance of monitoring SPO2 with pulse oximeter and demonstrate accurate use of the pulse oximeter.;To learn and understand importance of maintaining oxygen saturations>88%;To learn and demonstrate proper pursed lip breathing techniques or other breathing techniques.;To learn and demonstrate proper use of respiratory medications    Long  Term Goals  Exhibits compliance with exercise, home and travel O2 prescription;Maintenance of O2 saturations>88%;Verbalizes importance of monitoring SPO2 with pulse oximeter and return demonstration;Exhibits proper breathing techniques, such as pursed lip breathing or other method taught during program session;Compliance with respiratory medication       Oxygen  Re-Evaluation: Oxygen Re-Evaluation    Row Name 09/16/19 1456             Program Oxygen Prescription   Program Oxygen Prescription  Continuous       Liters per minute  2         Home Oxygen   Home Oxygen Device  None       Sleep Oxygen Prescription  CPAP       Home Exercise Oxygen Prescription  Continuous       Liters per minute  2       Home at Rest Exercise Oxygen Prescription  None       Compliance with Home Oxygen Use  Yes         Goals/Expected Outcomes   Short Term Goals  To learn and exhibit compliance with exercise, home and travel O2 prescription;To learn and understand importance of monitoring SPO2 with pulse oximeter and demonstrate accurate use of the pulse oximeter.;To learn and understand importance of maintaining oxygen saturations>88%;To learn and demonstrate proper pursed lip breathing techniques or other breathing techniques.;To learn and demonstrate proper use of respiratory medications       Long  Term Goals  Exhibits compliance with exercise, home and travel O2 prescription;Maintenance of O2 saturations>88%;Verbalizes importance of monitoring SPO2 with pulse oximeter and return demonstration;Exhibits proper breathing techniques, such  as pursed lip breathing or other method taught during program session;Compliance with respiratory medication       Goals/Expected Outcomes  compliance          Oxygen Discharge (Final Oxygen Re-Evaluation): Oxygen Re-Evaluation - 09/16/19 1456      Program Oxygen Prescription   Program Oxygen Prescription  Continuous    Liters per minute  2      Home Oxygen   Home Oxygen Device  None    Sleep Oxygen Prescription  CPAP    Home Exercise Oxygen Prescription  Continuous    Liters per minute  2    Home at Rest Exercise Oxygen Prescription  None    Compliance with Home Oxygen Use  Yes      Goals/Expected Outcomes   Short Term Goals  To learn and exhibit compliance with exercise, home and travel O2 prescription;To learn and understand importance of monitoring SPO2 with pulse oximeter and demonstrate accurate use of the pulse oximeter.;To learn and understand importance of maintaining oxygen saturations>88%;To learn and demonstrate proper pursed lip breathing techniques or other breathing techniques.;To learn and demonstrate proper use of respiratory medications    Long  Term Goals  Exhibits compliance with exercise, home and travel O2 prescription;Maintenance of O2 saturations>88%;Verbalizes importance of monitoring SPO2 with pulse oximeter and return demonstration;Exhibits proper breathing techniques, such as pursed lip breathing or other method taught during program session;Compliance with respiratory medication    Goals/Expected Outcomes  compliance       Initial Exercise Prescription: Initial Exercise Prescription - 09/11/19 1500      Date of Initial Exercise RX and Referring Provider   Date  09/11/19    Referring Provider  Dr. Chase Caller      Oxygen   Oxygen  Continuous    Liters  2      NuStep   Level  2    SPM  80    Minutes  15      Arm Ergometer   Level  1    Minutes  15      Prescription Details   Frequency (times per week)  2  Duration  Progress to 30 minutes  of continuous aerobic without signs/symptoms of physical distress      Intensity   THRR 40-80% of Max Heartrate  61-122    Ratings of Perceived Exertion  11-13    Perceived Dyspnea  0-4      Progression   Progression  Continue progressive overload as per policy without signs/symptoms or physical distress.      Resistance Training   Training Prescription  Yes    Weight  blue bands    Reps  10-15       Perform Capillary Blood Glucose checks as needed.  Exercise Prescription Changes: Exercise Prescription Changes    Row Name 09/16/19 1500             Response to Exercise   Blood Pressure (Admit)  140/80       Blood Pressure (Exercise)  144/80       Blood Pressure (Exit)  140/82       Heart Rate (Admit)  73 bpm       Heart Rate (Exercise)  84 bpm       Heart Rate (Exit)  79 bpm       Oxygen Saturation (Admit)  97 %       Oxygen Saturation (Exercise)  97 %       Oxygen Saturation (Exit)  98 %       Rating of Perceived Exertion (Exercise)  12       Perceived Dyspnea (Exercise)  2       Duration  Continue with 30 min of aerobic exercise without signs/symptoms of physical distress.       Intensity  THRR unchanged         Progression   Progression  Continue to progress workloads to maintain intensity without signs/symptoms of physical distress.         Resistance Training   Training Prescription  Yes       Weight  blue bands       Reps  10-15       Time  10 Minutes         Oxygen   Oxygen  Continuous       Liters  2         NuStep   Level  2       SPM  80       Minutes  15       METs  1.7         Arm Ergometer   Level  1       Minutes  15          Exercise Comments:   Exercise Goals and Review: Exercise Goals    Row Name 09/11/19 1548 09/16/19 1457           Exercise Goals   Increase Physical Activity  Yes  Yes      Intervention  Provide advice, education, support and counseling about physical activity/exercise needs.;Develop an individualized  exercise prescription for aerobic and resistive training based on initial evaluation findings, risk stratification, comorbidities and participant's personal goals.  Provide advice, education, support and counseling about physical activity/exercise needs.;Develop an individualized exercise prescription for aerobic and resistive training based on initial evaluation findings, risk stratification, comorbidities and participant's personal goals.      Expected Outcomes  Long Term: Exercising regularly at least 3-5 days a week.;Long Term: Add in home exercise to make exercise part of routine and to increase  amount of physical activity.;Short Term: Attend rehab on a regular basis to increase amount of physical activity.  Long Term: Exercising regularly at least 3-5 days a week.;Long Term: Add in home exercise to make exercise part of routine and to increase amount of physical activity.;Short Term: Attend rehab on a regular basis to increase amount of physical activity.      Increase Strength and Stamina  Yes  Yes      Intervention  Provide advice, education, support and counseling about physical activity/exercise needs.;Develop an individualized exercise prescription for aerobic and resistive training based on initial evaluation findings, risk stratification, comorbidities and participant's personal goals.  Provide advice, education, support and counseling about physical activity/exercise needs.;Develop an individualized exercise prescription for aerobic and resistive training based on initial evaluation findings, risk stratification, comorbidities and participant's personal goals.      Expected Outcomes  Short Term: Increase workloads from initial exercise prescription for resistance, speed, and METs.;Short Term: Perform resistance training exercises routinely during rehab and add in resistance training at home;Long Term: Improve cardiorespiratory fitness, muscular endurance and strength as measured by increased METs  and functional capacity (6MWT)  Short Term: Increase workloads from initial exercise prescription for resistance, speed, and METs.;Short Term: Perform resistance training exercises routinely during rehab and add in resistance training at home;Long Term: Improve cardiorespiratory fitness, muscular endurance and strength as measured by increased METs and functional capacity (6MWT)      Able to understand and use rate of perceived exertion (RPE) scale  Yes  Yes      Intervention  Provide education and explanation on how to use RPE scale  Provide education and explanation on how to use RPE scale      Expected Outcomes  Short Term: Able to use RPE daily in rehab to express subjective intensity level;Long Term:  Able to use RPE to guide intensity level when exercising independently  Short Term: Able to use RPE daily in rehab to express subjective intensity level;Long Term:  Able to use RPE to guide intensity level when exercising independently      Able to understand and use Dyspnea scale  Yes  Yes      Intervention  Provide education and explanation on how to use Dyspnea scale  Provide education and explanation on how to use Dyspnea scale      Expected Outcomes  Short Term: Able to use Dyspnea scale daily in rehab to express subjective sense of shortness of breath during exertion;Long Term: Able to use Dyspnea scale to guide intensity level when exercising independently  Short Term: Able to use Dyspnea scale daily in rehab to express subjective sense of shortness of breath during exertion;Long Term: Able to use Dyspnea scale to guide intensity level when exercising independently      Knowledge and understanding of Target Heart Rate Range (THRR)  Yes  Yes      Expected Outcomes  Short Term: Able to state/look up THRR;Short Term: Able to use daily as guideline for intensity in rehab;Long Term: Able to use THRR to govern intensity when exercising independently  Short Term: Able to state/look up THRR;Short Term: Able  to use daily as guideline for intensity in rehab;Long Term: Able to use THRR to govern intensity when exercising independently      Understanding of Exercise Prescription  Yes  Yes      Intervention  Provide education, explanation, and written materials on patient's individual exercise prescription  Provide education, explanation, and written materials on patient's individual exercise  prescription      Expected Outcomes  Short Term: Able to explain program exercise prescription;Long Term: Able to explain home exercise prescription to exercise independently  Short Term: Able to explain program exercise prescription;Long Term: Able to explain home exercise prescription to exercise independently         Exercise Goals Re-Evaluation : Exercise Goals Re-Evaluation    [place] Name 09/16/19 1458             Exercise Goal Re-Evaluation   Exercise Goals Review  Increase Physical Activity;Increase Strength and Stamina;Able to understand and use rate of perceived exertion (RPE) scale;Able to understand and use Dyspnea scale;Knowledge and understanding of Target Heart Rate Range (THRR);Understanding of Exercise Prescription       Comments  Pt has attended 1 exercise session. Pt exercised at 1.7 METs on the stepper. Will monitor and progress as able.       Expected Outcomes  Through exercise at rehab and at home, the patient will decrease shortness of breath with daily activities and feel confident in carrying out an exercise regime at home.          Discharge Exercise Prescription (Final Exercise Prescription Changes): Exercise Prescription Changes - 09/16/19 1500      Response to Exercise   Blood Pressure (Admit)  140/80    Blood Pressure (Exercise)  144/80    Blood Pressure (Exit)  140/82    Heart Rate (Admit)  73 bpm    Heart Rate (Exercise)  84 bpm    Heart Rate (Exit)  79 bpm    Oxygen Saturation (Admit)  97 %    Oxygen Saturation (Exercise)  97 %    Oxygen Saturation (Exit)  98 %    Rating  of Perceived Exertion (Exercise)  12    Perceived Dyspnea (Exercise)  2    Duration  Continue with 30 min of aerobic exercise without signs/symptoms of physical distress.    Intensity  THRR unchanged      Progression   Progression  Continue to progress workloads to maintain intensity without signs/symptoms of physical distress.      Resistance Training   Training Prescription  Yes    Weight  blue bands    Reps  10-15    Time  10 Minutes      Oxygen   Oxygen  Continuous    Liters  2      NuStep   Level  2    SPM  80    Minutes  15    METs  1.7      Arm Ergometer   Level  1    Minutes  15       Nutrition:  Target Goals: Understanding of nutrition guidelines, daily intake of sodium <1567m, cholesterol <2064m calories 30% from fat and 7% or less from saturated fats, daily to have 5 or more servings of fruits and vegetables.  Biometrics: Pre Biometrics - 09/11/19 1442      Pre Biometrics   Height  _0  (1.702 m)    Weight  112.2 kg    BMI (Calculated)  38.73        Nutrition Therapy Plan and Nutrition Goals:   Nutrition Assessments:   Nutrition Goals Re-Evaluation:   Nutrition Goals Discharge (Final Nutrition Goals Re-Evaluation):   Psychosocial: Target Goals: Acknowledge presence or absence of significant depression and/or stress, maximize coping skills, provide positive support system. Participant is able to verbalize types and ability to use techniques and skills  needed for reducing stress and depression.  Initial Review & Psychosocial Screening: Initial Psych Review & Screening - 09/11/19 1444      Initial Review   Current issues with  None Identified      Family Dynamics   Good Support System?  Yes      Barriers   Psychosocial barriers to participate in program  There are no identifiable barriers or psychosocial needs.      Screening Interventions   Interventions  Encouraged to exercise       Quality of Life Scores:  Scores of 19 and  below usually indicate a poorer quality of life in these areas.  A difference of  2-3 points is a clinically meaningful difference.  A difference of 2-3 points in the total score of the Quality of Life Index has been associated with significant improvement in overall quality of life, self-image, physical symptoms, and general health in studies assessing change in quality of life.  PHQ-9: Recent Review Flowsheet Data    Depression screen [facility] 2/9 09/11/2019 09/11/2019   Decreased Interest - 0   Down, Depressed, Hopeless 0 0   PHQ - 2 Score 0 0   Altered sleeping 0 -   Tired, decreased energy 0 -   Change in appetite 0 -   Feeling bad or failure about yourself  0 -   Trouble concentrating 0 -   Moving slowly or fidgety/restless 0 -   Suicidal thoughts 0 -   PHQ-9 Score 0 -   Difficult doing work/chores Not difficult at all -     Interpretation of Total Score  Total Score Depression Severity:  1-4 = Minimal depression, 5-9 = Mild depression, 10-14 = Moderate depression, 15-19 = Moderately severe depression, 20-27 = Severe depression   Psychosocial Evaluation and Intervention: Psychosocial Evaluation - 09/11/19 1445      Psychosocial Evaluation & Interventions   Interventions  Encouraged to exercise with the program and follow exercise prescription    Continue Psychosocial Services   No Follow up required       Psychosocial Re-Evaluation: Psychosocial Re-Evaluation    [place] Name 09/11/19 1445 09/16/19 1551           Psychosocial Re-Evaluation   Current issues with  None Identified  None Identified      Comments  -  no barriers identified      Expected Outcomes  -  That patient will continue to have no barriers or psychosocial concerns while in pulmonary rehab.      Interventions  Encouraged to attend Pulmonary Rehabilitation for the exercise  -         Psychosocial Discharge (Final Psychosocial Re-Evaluation): Psychosocial Re-Evaluation - 09/16/19 1551      Psychosocial  Re-Evaluation   Current issues with  None Identified    Comments  no barriers identified    Expected Outcomes  That patient will continue to have no barriers or psychosocial concerns while in pulmonary rehab.       Education: Education Goals: Education classes will be provided on a weekly basis, covering required topics. Participant will state understanding/return demonstration of topics presented.  Learning Barriers/Preferences:   Education Topics: Risk Factor Reduction:  -Group instruction that is supported by a PowerPoint presentation. Instructor discusses the definition of a risk factor, different risk factors for pulmonary disease, and how the heart and lungs work together.     Nutrition for Pulmonary Patient:  -Group instruction provided by PowerPoint slides, verbal discussion, and written materials  to support subject matter. The instructor gives an explanation and review of healthy diet recommendations, which includes a discussion on weight management, recommendations for fruit and vegetable consumption, as well as protein, fluid, caffeine, fiber, sodium, sugar, and alcohol. Tips for eating when patients are short of breath are discussed.   Pursed Lip Breathing:  -Group instruction that is supported by demonstration and informational handouts. Instructor discusses the benefits of pursed lip and diaphragmatic breathing and detailed demonstration on how to preform both.     Oxygen Safety:  -Group instruction provided by PowerPoint, verbal discussion, and written material to support subject matter. There is an overview of "What is Oxygen" and "Why do we need it".  Instructor also reviews how to create a safe environment for oxygen use, the importance of using oxygen as prescribed, and the risks of noncompliance. There is a brief discussion on traveling with oxygen and resources the patient may utilize.   Oxygen Equipment:  -Group instruction provided by [facility] Staff utilizing  handouts, written materials, and equipment demonstrations.   Signs and Symptoms:  -Group instruction provided by written material and verbal discussion to support subject matter. Warning signs and symptoms of infection, stroke, and heart attack are reviewed and when to call the physician/911 reinforced. Tips for preventing the spread of infection discussed.   Advanced Directives:  -Group instruction provided by verbal instruction and written material to support subject matter. Instructor reviews Advanced Directive laws and proper instruction for filling out document.   Pulmonary Video:  -Group video education that reviews the importance of medication and oxygen compliance, exercise, good nutrition, pulmonary hygiene, and pursed lip and diaphragmatic breathing for the pulmonary patient.   Exercise for the Pulmonary Patient:  -Group instruction that is supported by a PowerPoint presentation. Instructor discusses benefits of exercise, core components of exercise, frequency, duration, and intensity of an exercise routine, importance of utilizing pulse oximetry during exercise, safety while exercising, and options of places to exercise outside of rehab.     Pulmonary Medications:  -Verbally interactive group education provided by instructor with focus on inhaled medications and proper administration.   Anatomy and Physiology of the Respiratory System and Intimacy:  -Group instruction provided by PowerPoint, verbal discussion, and written material to support subject matter. Instructor reviews respiratory cycle and anatomical components of the respiratory system and their functions. Instructor also reviews differences in obstructive and restrictive respiratory diseases with examples of each. Intimacy, Sex, and Sexuality differences are reviewed with a discussion on how relationships can change when diagnosed with pulmonary disease. Common sexual concerns are reviewed.   MD DAY -A group question  and answer session with a medical doctor that allows participants to ask questions that relate to their pulmonary disease state.   OTHER EDUCATION -Group or individual verbal, written, or video instructions that support the educational goals of the pulmonary rehab program.   Holiday Eating Survival Tips:  -Group instruction provided by PowerPoint slides, verbal discussion, and written materials to support subject matter. The instructor gives patients tips, tricks, and techniques to help them not only survive but enjoy the holidays despite the onslaught of food that accompanies the holidays.   PULMONARY REHAB OTHER RESPIRATORY from 09/16/2019 in [place]  Date  09/16/19  Educator  -- [handout]      Knowledge Questionnaire Score: Knowledge Questionnaire Score - 09/11/19 1513      Knowledge Questionnaire Score   Pre Score  8/18       Core Components/Risk  Factors/Patient Goals at Admission: Personal Goals and Risk Factors at Admission - 09/11/19 1445      Core Components/Risk Factors/Patient Goals on Admission   Improve shortness of breath with ADL's  Yes    Intervention  Provide education, individualized exercise plan and daily activity instruction to help decrease symptoms of SOB with activities of daily living.    Expected Outcomes  Short Term: Improve cardiorespiratory fitness to achieve a reduction of symptoms when performing ADLs;Long Term: Be able to perform more ADLs without symptoms or delay the onset of symptoms       Core Components/Risk Factors/Patient Goals Review:  Goals and Risk Factor Review    Row Name 09/11/19 1445 09/16/19 1553           Core Components/Risk Factors/Patient Goals Review   Personal Goals Review  Develop more efficient breathing techniques such as purse lipped breathing and diaphragmatic breathing and practicing self-pacing with activity.;Increase knowledge of respiratory medications and ability to use  respiratory devices properly.;Improve shortness of breath with ADL's  (Pended)   Develop more efficient breathing techniques such as purse lipped breathing and diaphragmatic breathing and practicing self-pacing with activity.;Increase knowledge of respiratory medications and ability to use respiratory devices properly.;Improve shortness of breath with ADL's      Review  -  Today was his first day of exercise class, too early to see progress towards admission goals.      Expected Outcomes  -  See admission goals.         Core Components/Risk Factors/Patient Goals at Discharge (Final Review):  Goals and Risk Factor Review - 09/16/19 1553      Core Components/Risk Factors/Patient Goals Review   Personal Goals Review  Develop more efficient breathing techniques such as purse lipped breathing and diaphragmatic breathing and practicing self-pacing with activity.;Increase knowledge of respiratory medications and ability to use respiratory devices properly.;Improve shortness of breath with ADL's    Review  Today was his first day of exercise class, too early to see progress towards admission goals.    Expected Outcomes  See admission goals.       ITP Comments:   Comments: ITP REVIEW Pt is making expected progress toward pulmonary rehab goals after completing 1 sessions. Recommend continued exercise, life style modification, education, and utilization of breathing techniques to increase stamina and strength and decrease shortness of breath with exertion.

## 2019-09-17 DIAGNOSIS — E871 Hypo-osmolality and hyponatremia: Secondary | ICD-10-CM | POA: Diagnosis not present

## 2019-09-18 ENCOUNTER — Other Ambulatory Visit: Payer: Self-pay

## 2019-09-18 ENCOUNTER — Encounter (HOSPITAL_COMMUNITY)
Admission: RE | Admit: 2019-09-18 | Discharge: 2019-09-18 | Disposition: A | Discharge status: Home / Self Care | Payer: PPO | Source: Ambulatory Visit | Attending: Internal Medicine | Admitting: Internal Medicine

## 2019-09-18 DIAGNOSIS — J84112 Idiopathic pulmonary fibrosis: Secondary | ICD-10-CM

## 2019-09-18 NOTE — Telephone Encounter (Signed)
Pt states that he has still not heard anything from a DME company - please advise [phone]# [phone]

## 2019-09-18 NOTE — Progress Notes (Signed)
Daily Session Note  Patient Details  Name: Stephen [name] MRN: [id number] Date of Birth: November 17, 1950 Referring Provider:     Pulmonary Rehab Walk Test from 09/11/2019 in [place]  Referring Provider  Dr. Chase Caller      Encounter Date: 09/18/2019  Check In: Session Check In - 09/18/19 1444      Check-In   Supervising physician immediately available to respond to emergencies  [facility] immediately available    Physician(s)  Dr. Wyline Copas    Location  [facility]    Staff Present  Rosebud Poles, RN, Bjorn Loser, MS, Exercise Physiologist;Lisa Ysidro Evert, RN    Virtual Visit  No    Medication changes reported      No    Fall or balance concerns reported     No    Tobacco Cessation  No Change    Warm-up and Cool-down  Performed on first and last piece of equipment    Resistance Training Performed  Yes    VAD Patient?  No    PAD/SET Patient?  No      Pain Assessment   Currently in Pain?  No/denies    Multiple Pain Sites  No       Capillary Blood Glucose: No results found for this or any previous visit (from the past 24 hour(s)).    Social History   Tobacco Use  Smoking Status Never Smoker  Smokeless Tobacco Never Used    Goals Met:  Independence with exercise equipment Exercise tolerated well Strength training completed today  Goals Unmet:  Not Applicable  Comments: Service time is from 1330 to 1436    Dr. Rush Farmer is Medical Director for Pulmonary Rehab at [facility]

## 2019-09-18 NOTE — Telephone Encounter (Signed)
Called and spoke to pt. Informed him of the results and recs per MR. Order placed. Pt aware to call back by early/mid next week if he hasnt heard from DME. Pt verbalized understanding and denied any further questions or concerns at this time.

## 2019-09-18 NOTE — Telephone Encounter (Signed)
Yes he qualifies for o2 at night. HE can do 2L [facility] (previously I said 1L but make it 2L [place]) with his CPAP QHS

## 2019-09-19 DIAGNOSIS — J84112 Idiopathic pulmonary fibrosis: Secondary | ICD-10-CM | POA: Diagnosis not present

## 2019-09-23 ENCOUNTER — Other Ambulatory Visit: Payer: Self-pay

## 2019-09-23 ENCOUNTER — Encounter (HOSPITAL_COMMUNITY)
Admission: RE | Admit: 2019-09-23 | Discharge: 2019-09-23 | Disposition: A | Discharge status: Home / Self Care | Payer: PPO | Source: Ambulatory Visit | Attending: Internal Medicine | Admitting: Internal Medicine

## 2019-09-23 DIAGNOSIS — J84112 Idiopathic pulmonary fibrosis: Secondary | ICD-10-CM | POA: Diagnosis not present

## 2019-09-23 NOTE — Progress Notes (Signed)
Daily Session Note  Patient Details  Name: Stephen [name] MRN: [id number] Date of Birth: 07-Jul-1951 Referring Provider:     Pulmonary Rehab Walk Test from 09/11/2019 in [place]  Referring Provider  Dr. Chase Caller      Encounter Date: 09/23/2019  Check In: Session Check In - 09/23/19 1330      Check-In   Supervising physician immediately available to respond to emergencies  [facility] immediately available    Physician(s)  Dr. Wyline Copas    Location  [facility]    Staff Present  Rosebud Poles, RN, Bjorn Loser, MS, Exercise Physiologist;Lisa Ysidro Evert, RN    Virtual Visit  No    Medication changes reported      No    Fall or balance concerns reported     No    Tobacco Cessation  No Change    Warm-up and Cool-down  Performed as group-led instruction    Resistance Training Performed  Yes    VAD Patient?  No    PAD/SET Patient?  No      Pain Assessment   Currently in Pain?  No/denies    Multiple Pain Sites  No       Capillary Blood Glucose: No results found for this or any previous visit (from the past 24 hour(s)).    Social History   Tobacco Use  Smoking Status Never Smoker  Smokeless Tobacco Never Used    Goals Met:  Independence with exercise equipment Exercise tolerated well Strength training completed today  Goals Unmet:  Not Applicable  Comments: Service time is from 1330 to 1437    Dr. Rush Farmer is Medical Director for Pulmonary Rehab at [facility].

## 2019-09-29 ENCOUNTER — Other Ambulatory Visit (INDEPENDENT_AMBULATORY_CARE_PROVIDER_SITE_OTHER): Payer: PPO

## 2019-09-29 ENCOUNTER — Ambulatory Visit (INDEPENDENT_AMBULATORY_CARE_PROVIDER_SITE_OTHER): Payer: PPO

## 2019-09-29 ENCOUNTER — Encounter: Payer: Self-pay | Admitting: Internal Medicine

## 2019-09-29 ENCOUNTER — Telehealth: Payer: Self-pay | Admitting: Internal Medicine

## 2019-09-29 ENCOUNTER — Ambulatory Visit: Payer: PPO | Admitting: Internal Medicine

## 2019-09-29 ENCOUNTER — Other Ambulatory Visit: Payer: Self-pay

## 2019-09-29 VITALS — BP 130/80 | HR 67 | Ht 67.0 in | Wt 248.0 lb

## 2019-09-29 DIAGNOSIS — I10 Essential (primary) hypertension: Secondary | ICD-10-CM | POA: Diagnosis not present

## 2019-09-29 DIAGNOSIS — Z5181 Encounter for therapeutic drug level monitoring: Secondary | ICD-10-CM

## 2019-09-29 DIAGNOSIS — R0600 Dyspnea, unspecified: Secondary | ICD-10-CM

## 2019-09-29 DIAGNOSIS — R0609 Other forms of dyspnea: Secondary | ICD-10-CM

## 2019-09-29 DIAGNOSIS — J84112 Idiopathic pulmonary fibrosis: Secondary | ICD-10-CM

## 2019-09-29 DIAGNOSIS — E785 Hyperlipidemia, unspecified: Secondary | ICD-10-CM | POA: Diagnosis not present

## 2019-09-29 DIAGNOSIS — E119 Type 2 diabetes mellitus without complications: Secondary | ICD-10-CM | POA: Diagnosis not present

## 2019-09-29 LAB — HEPATIC FUNCTION PANEL
ALT: 24 U/L (ref 0–53)
AST: 28 U/L (ref 0–37)
Albumin: 4 g/dL (ref 3.5–5.2)
Alkaline Phosphatase: 59 U/L (ref 39–117)
Bilirubin, Direct: 0.1 mg/dL (ref 0.0–0.3)
Total Bilirubin: 0.4 mg/dL (ref 0.2–1.2)
Total Protein: 7.9 g/dL (ref 6.0–8.3)

## 2019-09-29 NOTE — Addendum Note (Signed)
Addended by: Lorretta Harp on: 09/29/2019 10:12 AM   Modules accepted: Orders

## 2019-09-29 NOTE — Telephone Encounter (Signed)
I called and spoke with patient. He states that since he has been placed on oxygen he needs something from Dr. Chase Caller stating this so that in the event of a power outage he will be on priority list. I advised him to call the power company and have them to send over a form for him to sign and we will fax it back. He verbalized understanding. Nothing further is needed at this time.

## 2019-09-29 NOTE — Patient Instructions (Addendum)
IPF (idiopathic pulmonary fibrosis) ([place]) Therapeutic drug monitoring  -IPF is worse over time but stable sept 2020 -> nov 2020 but you are having out of proportion shortness of breath  Glad you are tolerating pirfenidone so far  Glad you are attending pulmonary rehabilitation and you are managing with 2 L of oxygen with exertion and 1 L at night  Respect reluctance to undergo lung transplant as a therapeutic option  Plan -Continue pirfenidone per schedule -Check liver function test today 09/29/2019 -We will consider research study early part of 2021 ( we went over the concept of research) -Defer any lung transplant evaluation on account of lack of interest and significant weight/obesity  Dyspnea on exertion   -Significantly worse and is correlated with worsening pulmonary fibrosis.  However, your current shortness of breath is out of proportion to the level of pulmonary fibrosis.  I think your weight, physical deconditioning and heart muscle stiffness, diastolic dysfunction are playing a significant role.  In addition it is possible that the metoprolol is way too strong and her heart rate is not responding adequately with exertion (your nighttime oxygen test showed a heart rate of 45-50/min recently]   plan -Continue  pulmonary rehabilitation -Please call the cardiology office and set up an appointment for follow-up [I will message them again]     Morbid obesity  -This is a significant issue.  We discussed starting by cutting ice cream load to 1 scoop a week instead of one carton every 2-3 days   Follow-up -In 4-6 weeks repeat liver function test -can do this locally or come here and do it -Return to see [place] in mid January 2021 -in January 2021

## 2019-09-29 NOTE — Progress Notes (Signed)
IOV 03/05/2018  Chief Complaint  Patient presents with  . Consult    Referral per MD Helene Kelp, Dec 2018 had sinus infection and still has cough, hx of mild bronchiectasis, later was given symbicort which patient believes is making him more SOB.      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  He [facility] in  [facility] and presents with his wife for evaluation of pulmonary fibrosis to the ILD clinic.  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  During this time he was not exposed to any metal dust but he was exposed to mold and damp environments intermittently but not always.  He denies any mold or mildew in the house.  He tells me that in December 2018 he had a sinus infection and since then has a lingering cough that never really went away.  It looks like from the primary care physician he saw dentist and from there he had imaging and this finally all resulted in a high-resolution CT chest January 21, 2018 that I personally visualized.  The official report is that it is indeterminate for UIP.  In my personal visualization there is bilateral bibasal symmetric disease with definite of craniocaudal gradient.  The septal thickening and also cylindrical bronchiectasis and peripheral bronchiec bronchiolectasis.  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  He does not have much of her shortness of breath but then he is morbidly obese and has balance issues and is quite limited.  He was seen by a local pulmonologist will give him a 2-5-year survival rate and therefore he is frustrated.  He was started on Symbicort which actually made things worse for him.  [place] pulmonary interstitial lung disease questionnaire  Symptoms: Since dec 2019. Some sputjm +. Better since started. Insidious dyspnea x 4 months. Same since onset. No episodic  dyspnea. lLeve 4 dyspnea walking wiuth peers and level 5 walking up hill/stairs. Level 1 for dressing, Level 2 fo rshopping and mpwing lawn  ROS: chronic balance issues affecting mobiluity.  In association with this he has fatigue for the last 3 years.  He has dry mouth for the last several years associated with some dysphagia for the last 10 years heartburn and acid reflux for several years.  Daytime set sleepiness for the last several years but denies any oral ulcers or rash or weight loss or recurrent fever or arthralgia  Past medical history: Denies any asthma COPD or heart failure rheumatoid arthritis or scleroderma or systemic lupus erythematosus or polymyositis or Sjogren's but is positive for sleep apnea and hiatal hernia.  Negative for pulmonary hypertension diabetes thyroid disease stroke seizures hepatitis tuberculosis kidney disease blood clots heart disease other than murmur pleurisy  Family history of pulmonary disease: Positive for asthma in the mother but nobody with pulmonary fibrosis  Personal exposure history: He has never smoked cigarettes did smoke cigars for 3 years.  [place]  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  During this time there is intermittent mold exposure  Pulmonary toxicity history denies a laundry list of pulmonary toxicity medications    OV 04/11/2018  Chief Complaint  Patient presents with  . Follow-up    PFT performed today.  Pt is still coughing with occ. white mucus and pt also states he believes SOB is worse and is becoming SOB more easily.    Follow-up interstitial lung disease work-up  Symptoms: In the interim no new symptoms.  He does admit to having  chronic arthralgia for many decades after sheetrock fell on him.  He also has joint stiffness especially in the hands but he does not know if it is early morning stiffness.  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 Lorin Picket.  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  His autoimmune and vascular does panel shows trace positivity for ANCA and cyclic citrulline peptide.    OV 06/04/2018   Chief Complaint  Patient presents with  . Follow-up    Pt states things have been about the same since last visit.   FU ILD - ccp and atypical p-anca trace positive May 2019. HP panel negative.  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 Lung function: Severity -FVC 63% and DLCO 58% showing moderate ILD. Rheum evaluation - 04/25/18 - no evidence of RA (Dr Trudie Reed)  Here with his wife to discuss test results. In the interim he saw Dr. Gavin Pound on 04/25/2018. She is of rheumatology. Clinically she did not find any evidence of systemic rheumatoid arthritis or vasculitis. Therefore he is here for decision making regarding his ILD. In the interim there are no new issues.  OV 07/16/2018  Subjective:  Patient ID: Stephen [name], male , DOB: 1951-10-23 , age 26 y.o. , MRN: [id number] , ADDRESS: [address] [place] [address]   07/16/2018 -   Chief Complaint  Patient presents with  . Follow-up    Pt started OFEV 8/11 and so far denies any complaints.    FU ILD - ccp and atypical p-anca trace positive May 2019. HP panel negative.  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 Lung function: Severity -FVC 63% and DLCO 58% showing moderate ILD. Rheum evaluation - 04/25/18 - no evidence of RA (Dr Trudie Reed). Dx of IPF given 06/04/18. STarted ofev 8/11/9    HPI Stephen [name] 64 y.o. - presents for follow-up of his idiopathic pulmonary fibrosis. Diagnoses given 06/04/2018. He is started on  Ofev 06/09/2018. He is here with his wife. He says he is tolerating the Ofev just fine. No GI issues. He gets support from the Child psychotherapist Clarene Critchley. He will have a liver function test today. He deferred flu shot because he will have it with his primary care physician. He told me that he is planning to join the pulmonary fibrosis foundation patient support group. Next meeting is 07/25/2018 Thursday 6 PM at [facility]. He is also willing to join research trials.He has mobility issues on account of his obesity and cane use. He says he cannot do a treadmill or walk long distances.   He has chronic tinnitus and apparently this is from working in [employer] base during [place] War although he was never deployed overseas. He started getting VA eligibility. He asked [place] to fill forms. ROS - per HPI  OV 09/03/2018  Subjective:  Patient ID: Sandy Salaam  Sammie Bench, male , DOB: 1951-07-12 , age 4 y.o. , MRN: [id number] , ADDRESS: [address] [place] [address]   09/03/2018 -   Chief Complaint  Patient presents with  . Follow-up    Doing well at this time coughing up white mucus     FU ILD - ccp and atypical p-anca trace positive May 2019. HP panel negative.  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 Lung function: Severity -FVC 63% and DLCO 58% showing moderate ILD. Rheum evaluation - 04/25/18 - no evidence of RA (Dr Trudie Reed). Dx of IPF given 06/04/18. STarted ofev 8/11/9   HPI ALONSO GAPINSKI 68 y.o. -returns for follow-up of his IPF.  He has been on nintedanib since mid August 2019.  He is here with his wife.  So far is tolerating the nintedanib just fine.  He says that his baseline constipation still continues and he needs both MiraLAX and Citrucel.  However what he notices that when he has relief of constipation it is diarrhea.  He says he is not able to come down on the laxative dose because otherwise the constipation returns.  He feels the 05 is not causing any diarrhea.  Is no  abdominal pain nausea vomiting.  In terms of shortness of breath is stable.  He tried to have pulmonary function test as follow-up like a few weeks ago but he started coughing and did not produce enough spirometry's.  All we have the symptoms which is stable.  He is up-to-date with his flu shot.  He is interested in research protocols.  He uses a cane normally.      OV 12/17/2018  Subjective:  Patient ID: Stephen [name], male , DOB: [birth year] , age 56 y.o. , MRN: [id number] , ADDRESS: [address] [place] [address]   12/17/2018 -   Chief Complaint  Patient presents with  . Follow-up    PFT performed today but pt was unable to perform DLCO due to coughing. Pt states SOB is about the same. Denies any CP or chest tightness.    FU ILD - ccp and atypical p-anca- ALL trace positive May 2019. HP panel negative.  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 Lung function: Severity -FVC 63% and DLCO 58% showing moderate ILD. Rheum evaluation - 04/25/18 - no evidence of RA (Dr Trudie Reed). Dx of IPF given 06/04/18. STarted ofev 8/11/9   HPI SIDNEY KANN 68 y.o. -IPF follow-up.  He presents with his wife as usual.  He tells me that starting January 2020 nintedanib started causing significant diarrhea.  By the third week of January 2020 he reduced himself to 1 tablet once daily at 150 mg.  With this there is no diarrhea.  He says that at the full dose he would take Imodium for diarrhea and then he would get constipated for several days.  He would follow this with a laxative and then he would have explosive diarrhea.  He said the symptoms became unmanageable.  In terms of his IPF itself he is stable.  His other main issues worsening chronic cough.  He has had chronic cough for b for several years predating IPF diagnosis.  He has been on ACE inhibitor's but it appears that lisinopril got rotated to benazepril and then to losartan but the cough never improved.  So he got switch back to benazepril.  He says that his  primary care team is aware that all these agents can cause cough  but the emphasis was on controlling the blood pressure.  He says the cough is severe.  Mostly in the daytime very rarely at night.  There is a laryngeal hoarse quality to his cough.  Noticed results on fish oil and carvedilol.  We discussed about neuropathic treatment for cough but he is already on amitriptyline ROS - per HPI   OV 06/30/2019  Subjective:  Patient ID: Stephen [name], male , DOB: 1951-10-05 , age 32 y.o. , MRN: [id number] , ADDRESS: [address] [place] [address]   06/30/2019 -   Chief Complaint  Patient presents with  . IPF (idiopathic pulmonary fibrosis)    Does not feel the breathing is any different than last visit. Still using Ofev, but still having diarrhea with it.     FU ILD - ccp and atypical p-anca- ALL trace positive May 2019. HP panel negative.  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 Lung function: Severity -FVC 63% and DLCO 58% showing moderate ILD. Rheum evaluation - 04/25/18 - no evidence of RA (Dr Trudie Reed). Dx of IPF given 06/04/18. STarted ofev 06/09/18  HPI CORAL SOLER 68 y.o. -presents for follow-up with his wife.  He has a diagnosis of IPF.  He has been on nintedanib since August 2019.  He preferred to take nintedanib initially because of his preference to have diarrhea.  At this point in time he tells me that he is having progressive dyspnea.  Early in the spring 2020 he noticed that he could not walk back from his office to the front door.  He could do this in 2019.  He also has had difficulty mowing the lawn with the more.  In addition is having significant diarrhea from the nintedanib.  It is severe despite agents.  He is willing to change the drug.  In between he saw the nurse practitioner he had a high-resolution CT chest in August 2020.  He has mildly progressive disease even compared to March 2020.  Walking desaturation test and symptom scores show evidence of progression and documented  below.       OV 08/27/2019  Subjective:  Patient ID: Stephen [name], male , DOB: [birth year] , age 30 y.o. , MRN: [id number] , ADDRESS: [address] [place] [address]  FU IPF  - ccp and atypical p-anca- ALL trace positive May 2019. HP panel negative.  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 Lung function: Severity -FVC 63% and DLCO 58% showing moderate ILD. Rheum evaluation - 04/25/18 - no evidence of RA (Dr Trudie Reed). Dx of IPF given 06/04/18. STarted ofev 8/11/9. STopped Aug 2020 due to progression and diarrhea. STrted Esbriet -July 25, 2019   08/27/2019 -   Chief Complaint  Patient presents with  . Follow-up    Patient reports that his breathing is about the same. He has sob with exertion and productive cough.      HPI JOHNCARLOS HOLTSCLAW 68 y.o. -presents with his wife for follow-up.  He tells me that Jacklynn Bue was started on July 25, 2019.  He took 3 weeks for it to come.  During this time he was off nintedanib and his diarrhea resolved. Wife tells me that he has been completely sedentary because of shortness of breath.  He feels the shortness of breath is getting worse.  He is also gained weight because of his sedentary lifestyle.  He is already morbidly obese.  He has pedal edema for which she wears stockings.  He is not using oxygen at night because he is never needed it.  He is very concerned about the shortness of breath getting worse.  In fact it is worse even since his last visit in August 2020.  This is documented in the questionnaire below.  Also seen in the pulmonary function test.  His CT scan of the chest August 2020 shows worsening within 5 months.  I reviewed this with him.  He tells me that even taking a shower makes him desaturate although today he did not desaturate here in with a forehead probe to 88%.  However he did not do a 6-minute walk test.  He feels he would benefit from oxygen   IMPRESSION: HRCT Augu 2020 compared to March 2020 1. Spectrum of findings  compatible with basilar predominant fibrotic interstitial lung disease with probable early honeycombing at the right costophrenic angle. Slight interval progression. Findings are consistent with UIP per consensus guidelines: Diagnosis of Idiopathic Pulmonary Fibrosis: An Official ATS/ERS/JRS/ALAT Clinical Practice Guideline. [place], Iss 5, [phone], Jun 30 2017. 2. Mild mediastinal lymphadenopathy is stable, compatible with benign reactive adenopathy. 3. Three-vessel coronary atherosclerosis.  Aortic Atherosclerosis (ICD10-I70.0).   Electronically Signed   By: Ilona Sorrel M.D.   On: 06/28/2019 16:35  ROS - per HPI   OV 09/29/2019  Subjective:  Patient ID: Stephen [name], male , DOB: 09-Jul-1951 , age 31 y.o. , MRN: [id number] , ADDRESS: [address] [place] [address]   FU IPF  -trace positive ccp and atypical p-anca- ALL trace positive May 2019 (repeat CCP - October 2020]. HP panel negative.  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 Lung function: Severity -FVC 63% and DLCO 58% showing moderate ILD. Rheum evaluation - 04/25/18 - no evidence of RA (Dr Trudie Reed). Dx of IPF given 06/04/18. STarted ofev 8/11/9. STopped Aug 2020 due to progression and diarrhea. STrted Esbriet -July 25, 2019   Associated sleep apnea present   Last CT 06/27/2019 Low risk cardiac stress test April 2019.   09/29/2019 -   Chief Complaint  Patient presents with  . Follow-up    Pt states his breathing has become worse since last visit and also has a worsening cough and is coughing up dark green phlegm which he has had for awhile now. Pt is on 2L O2 24/7.      HPI XZAIVER VAYDA 68 y.o. -he is now attending pulmonary rehabilitation.  On September 16, 2019 he did have a 6-minute walk test.  He did desaturate to 80% from 96%.  He dropped his pulse ox to 86% at the second minut.  He did correct with 2 L of oxygen.  E also in the interim he did have overnight oxygen study  and we have recommended 2 L nasal cannula oxygen with the CPAP.  He is now wearing that.  Of note, during the overnight oxygen study his heart rate was 48-50.  Today he also had a 6-minute walk test at our office on 2 L oxygen and with this his pulse ox did not drop.  Overall he is reporting stability.  This can be demonstrated by the symptom questionnaire below.  He does have significant amount of shortness of breath despite wearing his 2 L.  Even though the 2 L helps his dyspnea he still says the dyspnea is significant.  He still has chronic significant pedal edema.  He continues to be morbidly obese.  We discussed  his diet.  His wife states that he eats all the time and cannot stop eating.  Apparently he goes through a carton of ice cream every few days.  He says that he will not be able to control his dietary indiscretion.  He is not interested in lung transplant.  He is enjoying pulmonary rehabilitation.  Given his diastolic dysfunction I did refer him to his cardiologist but he says he does not have an appointment.   SYMPTOM SCALE - ILD 12/17/2018  06/30/2019 Stop ofev 08/27/2019 esbriet since July 25, 2019 09/29/2019 esbriet  O2 use RA ra ra  2 L  Shortness of Breath 0 -> 5 scale with 5 being worst (score 6 If unable to do)     At rest _0 Simple tasks - showers, clothes change, eating, shaving _1 Household (dishes, doing bed, laundry) x 0 x x  Walking level at own pace 1 1 x 3  Walking keeping up with others of same age _2 Walking up Stairs _3 Walking up Hill _4 Total (40 - 48) Dyspnea Score _5 How bad is your cough? _6 How bad is your fatigue _7 Simple office walk 185 feet x  3 laps goal with forehead probe 03/05/2018  06/04/2018   09/03/2018  12/17/2018  06/30/2019 Stop ofev Start esbriet 08/27/2019   O2 used _8  Room air  Number laps completed All 3 laops All 3 laps all3 3 x 185  3   Comments about pace Walks very slow due to baseline balance issue "40% of balance only due to remote trauma  nomral mod pace using cane Slow with cane Slow with ane Slow with cane  Resting Pulse Ox/HR 100% and 55/min 100% and 57/min 100% and 59/min 100% and 61/min 97% and 56/min 98% and 65/min  Final Pulse Ox/HR 97% and 78/min 97% and 77/mi 98% and 76/min 95% and 91/min 91% and 87/min 92% and 94/mio  Desaturated </= 88% _9  no  Desaturated <= 3% points yes yes no Yes, 5 points Yes, 6 points Yes, 6 point  Got Tachycardic >/= 90/min no no no yes no yes  Symptoms at end of test No, denied dyspnea x none none none veryt dyspneic  Miscellaneous comments npne x x          Results for MORDECHAI, MATUSZAK (MRN [id number]) as of 08/27/2019 10:10  Ref. Range 04/11/2018 10:31 08/16/2018 15:35 10/03/2018 14:46 ofev 08/27/2019 08:51 esbriet  FVC-Pre Latest Units: L 2.57 2.53 2.42 2.18  FVC-%Pred-Pre Latest Units: % 63 67 65 56   Results for LEGACY, LACIVITA (MRN [id number]) as of 08/27/2019 10:10  Ref. Range 04/11/2018 10:31 08/16/2018 15:35 10/03/2018 14:46 08/27/2019 08:51  DLCO unc Latest Units: ml/min/mmHg 16.68   13.67  DLCO unc % pred Latest Units: % 58   59      ROS - per HPI     has a past medical history of Allergy, Arthritis, GERD (gastroesophageal reflux disease), Heart murmur, Hiatal hernia, Hyperlipidemia, Hypertension, Sleep apnea, and Vertigo.   reports that he has never smoked. He has never used smokeless tobacco.  Past Surgical History:  Procedure Laterality Date  . CARDIAC CATHETERIZATION  03/22/2001  . LEG SURGERY Bilateral 04/2001  Allergies  Allergen Reactions  . Ciprofloxacin Other (See Comments)    colitis  . Codeine     Immunization History  Administered Date(s) Administered  . Fluad Quad(high Dose 65+) 06/30/2019  . Influenza, High Dose Seasonal PF 08/28/2017, 07/19/2018  . Pneumococcal Conjugate-13 08/25/2016  . Pneumococcal Polysaccharide-23  11/18/2007    Family History  Problem Relation Age of Onset  . Dementia Mother   . Heart attack Father   . Aneurysm Father   . Cancer Maternal Grandmother   . Aneurysm Maternal Grandmother      Current Outpatient Medications:  .  amitriptyline (ELAVIL) 75 MG tablet, Take 75 mg by mouth at bedtime., Disp: , Rfl:  .  amLODipine (NORVASC) 10 MG tablet, Take 10 mg by mouth daily., Disp: , Rfl:  .  aspirin EC 81 MG tablet, Take 81 mg by mouth daily., Disp: , Rfl:  .  calcium carbonate (OSCAL) 1500 (600 Ca) MG TABS tablet, Take by mouth 2 (two) times daily with a meal., Disp: , Rfl:  .  calcium carbonate (TUMS - DOSED IN MG ELEMENTAL CALCIUM) 500 MG chewable tablet, Chew 1 tablet by mouth daily., Disp: , Rfl:  .  cholecalciferol (VITAMIN D) 1000 units tablet, Take 1,000 Units by mouth daily., Disp: , Rfl:  .  cloNIDine (CATAPRES) 0.3 MG tablet, Take 0.3 mg by mouth daily after supper. , Disp: , Rfl:  .  dicyclomine (BENTYL) 10 MG capsule, Take 10 mg by mouth 4 (four) times daily -  before meals and at bedtime., Disp: , Rfl:  .  lansoprazole (PREVACID) 30 MG capsule, Take 30 mg by mouth daily at 12 noon., Disp: , Rfl:  .  Methylcellulose, Laxative, (CITRUCEL PO), Take 1 Scoop by mouth., Disp: , Rfl:  .  metoprolol tartrate (LOPRESSOR) 50 MG tablet, Take 1 tablet by mouth 2 (two) times daily., Disp: , Rfl:  .  montelukast (SINGULAIR) 10 MG tablet, Take 10 mg by mouth at bedtime., Disp: , Rfl:  .  Multiple Vitamin (MULTIVITAMIN WITH MINERALS) TABS tablet, Take 1 tablet by mouth daily., Disp: , Rfl:  .  nabumetone (RELAFEN) 750 MG tablet, Take 750 mg by mouth 2 (two) times daily., Disp: , Rfl:  .  olmesartan (BENICAR) 20 MG tablet, Take 1 tablet by mouth daily., Disp: , Rfl:  .  Pirfenidone (ESBRIET) 801 MG TABS, Take 1 tablet by mouth 3 (three) times daily., Disp: 270 tablet, Rfl: 0 .  polyethylene glycol (MIRALAX / GLYCOLAX) packet, Take 17 g by mouth daily., Disp: , Rfl:  .  Probiotic Product  (PROBIOTIC DAILY PO), Take 1 capsule by mouth daily., Disp: , Rfl:  .  rosuvastatin (CRESTOR) 10 MG tablet, Take 10 mg by mouth daily., Disp: , Rfl:  .  sodium chloride 1 g tablet, Take 1 g by mouth 3 (three) times daily., Disp: , Rfl:  .  spironolactone (ALDACTONE) 25 MG tablet, Take 25 mg by mouth 3 (three) times a week., Disp: , Rfl:  .  UNABLE TO FIND, Take 1 capsule by mouth 3 (three) times daily. Med Name: blood sugar harmony, Disp: , Rfl:  .  UNABLE TO FIND, Take 1 capsule by mouth 3 (three) times daily. Med Name: Clear lungs, Disp: , Rfl:  .  vitamin B-12 (CYANOCOBALAMIN) 1000 MCG tablet, Take 1,000 mcg by mouth daily., Disp: , Rfl:  .  vitamin E 400 UNIT capsule, Take 400 Units by mouth daily., Disp: , Rfl:       Objective:  Vitals:   09/29/19 0937  BP: 130/80  Pulse: 67  SpO2: 99%  Weight: 248 lb (112.5 kg)  Height: _0  (1.702 m)    Estimated body mass index is 38.84 kg/m as calculated from the following:   Height as of this encounter: _1  (1.702 m).   Weight as of this encounter: 248 lb (112.5 kg).  _2 @  [employer]   09/29/19 0937  Weight: 248 lb (112.5 kg)     Physical Exam  General Appearance:    Alert, cooperative, no distress, appears stated age - yes , Deconditioned looking - no , OBESE  - yes, Sitting on Wheelchair -  no  Head:    Normocephalic, without obvious abnormality, atraumatic  Eyes:    PERRL, conjunctiva/corneas clear,  Ears:    Normal TM's and external ear canals, both ears  Nose:   Nares normal, septum midline, mucosa normal, no drainage    or sinus tenderness. OXYGEN ON  - yes . Patient is @ 2L   Throat:   Lips, mucosa, and tongue normal; teeth and gums normal. Cyanosis on lips - no  Neck:   Supple, symmetrical, trachea midline, no adenopathy;    thyroid:  no enlargement/tenderness/nodules; no carotid   bruit or JVD  Back:     Symmetric, no curvature, ROM normal, no CVA tenderness  Lungs:     Distress - no , Wheeze no,  Barrell Chest - no, Purse lip breathing - no, Crackles - yes at base   Chest Wall:    No tenderness or deformity.    Heart:    Regular rate and rhythm, S1 and S2 normal, no rub   or gallop, Murmur - no  Breast Exam:    NOT DONE  Abdomen:     Soft, non-tender, bowel sounds active all four quadrants,    no masses, no organomegaly. Visceral obesity - yes  Genitalia:   NOT DONE  Rectal:   NOT DONE  Extremities: yes  Extremities - normal, Has Cane - yes, Clubbing - yes, Edema - yes  Pulses:   2+ and symmetric all extremities  Skin:   Stigmata of Connective Tissue Disease - no  Lymph nodes:   Cervical, supraclavicular, and axillary nodes normal  Psychiatric:  Neurologic:   Pleasant - yes, Anxious - no, Flat affect - no  CAm-ICU - neg, Alert and Oriented x 3 - yes, Moves all 4s - yes, Speech - normal, Cognition - intact           Assessment:       ICD-10-CM   1. IPF (idiopathic pulmonary fibrosis) ([place])  J84.112   2. Therapeutic drug monitoring  Z51.81   3. Dyspnea on exertion  R06.00        Plan:     Patient Instructions  IPF (idiopathic pulmonary fibrosis) ([place]) Therapeutic drug monitoring  -IPF is worse over time but stable sept 2020 -> nov 2020 but you are having out of proportion shortness of breath  Glad you are tolerating pirfenidone so far  Glad you are attending pulmonary rehabilitation and you are managing with 2 L of oxygen with exertion and 1 L at night  Respect reluctance to undergo lung transplant as a therapeutic option  Plan -Continue pirfenidone per schedule -Check liver function test today 09/29/2019 -We will consider research study early part of 2021 ( we went over the concept of research) -Defer any lung transplant evaluation on account of lack of interest and significant weight/obesity  Dyspnea on exertion   -Significantly worse and is correlated with worsening pulmonary fibrosis.  However, your current shortness of breath is out of proportion to  the level of pulmonary fibrosis.  I think your weight, physical deconditioning and heart muscle stiffness, diastolic dysfunction are playing a significant role.  In addition it is possible that the metoprolol is way too strong and her heart rate is not responding adequately with exertion (your nighttime oxygen test showed a heart rate of 45-50/min recently]   plan -Continue  pulmonary rehabilitation -Please call the cardiology office and set up an appointment for follow-up [I will message them again]     Morbid obesity  -This is a significant issue.  We discussed starting by cutting ice cream load to 1 scoop a week instead of one carton every 2-3 days   Follow-up -In 4-6 weeks repeat liver function test -can do this locally or come here and do it -Return to see [place] in mid January 2021 -in January 2021   > 50% of this > 25 min visit spent in  face to face counseling or coordination of care - by this undersigned MD - Dr Brand Males. This includes one or more of the following documented above: discussion of test results, diagnostic or treatment recommendations, prognosis, risks and benefits of management options, instructions, education, compliance or risk-factor reduction   SIGNATURE    Dr. Brand Males, M.D., F.C.C.P,  Pulmonary and Critical Care Medicine Staff Physician, [place] Director - Interstitial Lung Disease  Program  Pulmonary [facility] [place], [place], [address]  Pager: [phone], If no answer or between  15:00h - 7:00h: call [phone] Telephone: [phone]  10:07 AM 09/29/2019

## 2019-09-29 NOTE — Progress Notes (Signed)
SIX MIN WALK 09/29/2019 08/27/2019 06/30/2019 06/05/2019 12/17/2018 09/03/2018 07/16/2018  Supplimental Oxygen during Test? (L/min) _0  No No  Laps 6 - - - - - -  Partial Lap (in Meters) 14 - - - - - -  Baseline BP (sitting) 142/88 - - - - - -  Baseline Heartrate 64 - - - - - -  Baseline Dyspnea (Borg Scale) 3 - - - - - -  Baseline Fatigue (Borg Scale) 2 - - - - - -  Baseline SPO2 99 - - - - - -  BP (sitting) 150/90 - - - - - -  Heartrate 80 - - - - - -  Dyspnea (Borg Scale) 5 - - - - - -  Fatigue (Borg Scale) 4 - - - - - -  SPO2 96 - - - - - -  BP (sitting) 150/86 - - - - - -  Heartrate 74 - - - - - -  SPO2 98 - - - - - -  Stopped or Paused before Six Minutes No - - - - - -  Distance Completed 218 - - - - - -  Tech Comments: - Pt. walked at steady pace with cane. He was very sob. ER Patient walked with assistance of a cane, was able to maintain a slow steady walking pace. Patient did not stop to rest at any time. The patient was able to maintain a steady walking pace with the use of his cane. The patient did not have to stop or sit down during the walk. Patient stated he is ok on level ground, but the difficulty comes when walking up steps or an incline. Pt walked at a slow pace with a cane completing all required laps denying any complaints. slow paced but no sob noted amb at slow steady pace

## 2019-09-30 ENCOUNTER — Telehealth: Payer: Self-pay | Admitting: Internal Medicine

## 2019-09-30 ENCOUNTER — Encounter (HOSPITAL_COMMUNITY)
Admission: RE | Admit: 2019-09-30 | Discharge: 2019-09-30 | Disposition: A | Discharge status: Home / Self Care | Payer: PPO | Source: Ambulatory Visit | Attending: Internal Medicine | Admitting: Internal Medicine

## 2019-09-30 VITALS — Wt 245.2 lb

## 2019-09-30 DIAGNOSIS — J84112 Idiopathic pulmonary fibrosis: Secondary | ICD-10-CM | POA: Insufficient documentation

## 2019-09-30 NOTE — Telephone Encounter (Signed)
Will forward to [place] to f/u on, thanks

## 2019-09-30 NOTE — Progress Notes (Signed)
Daily Session Note  Patient Details  Name: Stephen [name] MRN: [id number] Date of Birth: July 28, 1951 Referring Provider:     Pulmonary Rehab Walk Test from 09/11/2019 in [place]  Referring Provider  Dr. Chase Caller      Encounter Date: 09/30/2019  Check In: Session Check In - 09/30/19 1408      Check-In   Supervising physician immediately available to respond to emergencies  [facility] immediately available    Physician(s)  Dr. Eliseo Squires    Location  [facility]    Staff Present  Rosebud Poles, RN, Bjorn Loser, MS, Exercise Physiologist;Keyara Ent Ysidro Evert, RN    Virtual Visit  No    Medication changes reported      No    Fall or balance concerns reported     No    Tobacco Cessation  No Change    Warm-up and Cool-down  Performed on first and last piece of equipment    Resistance Training Performed  Yes    VAD Patient?  No    PAD/SET Patient?  No      Pain Assessment   Currently in Pain?  No/denies    Multiple Pain Sites  No       Capillary Blood Glucose: No results found for this or any previous visit (from the past 24 hour(s)).  Exercise Prescription Changes - 09/30/19 1500      Response to Exercise   Blood Pressure (Admit)  136/60    Blood Pressure (Exercise)  136/76    Blood Pressure (Exit)  128/70    Heart Rate (Admit)  78 bpm    Heart Rate (Exercise)  91 bpm    Heart Rate (Exit)  80 bpm    Oxygen Saturation (Admit)  94 %    Oxygen Saturation (Exercise)  96 %    Oxygen Saturation (Exit)  97 %    Rating of Perceived Exertion (Exercise)  13    Perceived Dyspnea (Exercise)  1    Duration  Continue with 30 min of aerobic exercise without signs/symptoms of physical distress.    Intensity  THRR unchanged      Progression   Progression  Continue to progress workloads to maintain intensity without signs/symptoms of physical distress.      Resistance Training   Training Prescription  Yes    Weight  blue bands    Reps  10-15    Time  10 Minutes      Oxygen   Oxygen  Continuous    Liters  2      NuStep   Level  2    SPM  80    Minutes  15    METs  1.7      Arm Ergometer   Level  1    Minutes  15       Social History   Tobacco Use  Smoking Status Never Smoker  Smokeless Tobacco Never Used    Goals Met:  Exercise tolerated well No report of cardiac concerns or symptoms Strength training completed today  Goals Unmet:  Not Applicable  Comments: Service time is from 1315 to 1450    Dr. Rush Farmer is Medical Director for Pulmonary Rehab at [facility].

## 2019-10-01 NOTE — Telephone Encounter (Signed)
I have received the paperwork. Once MR is back in the office on Monday 12/7, I will have him sign the form so I can fax it for pt. Will keep encounter open until all has been taken care of.

## 2019-10-02 ENCOUNTER — Encounter (HOSPITAL_COMMUNITY)
Admission: RE | Admit: 2019-10-02 | Discharge: 2019-10-02 | Disposition: A | Discharge status: Home / Self Care | Payer: PPO | Source: Ambulatory Visit | Attending: Internal Medicine | Admitting: Internal Medicine

## 2019-10-02 ENCOUNTER — Other Ambulatory Visit: Payer: Self-pay

## 2019-10-02 ENCOUNTER — Ambulatory Visit (INDEPENDENT_AMBULATORY_CARE_PROVIDER_SITE_OTHER): Payer: PPO

## 2019-10-02 ENCOUNTER — Encounter: Payer: Self-pay | Admitting: Cardiology

## 2019-10-02 ENCOUNTER — Ambulatory Visit (INDEPENDENT_AMBULATORY_CARE_PROVIDER_SITE_OTHER): Payer: PPO | Admitting: Cardiology

## 2019-10-02 VITALS — BP 138/72 | HR 72 | Ht 67.0 in | Wt 245.0 lb

## 2019-10-02 DIAGNOSIS — E663 Overweight: Secondary | ICD-10-CM

## 2019-10-02 DIAGNOSIS — I10 Essential (primary) hypertension: Secondary | ICD-10-CM | POA: Diagnosis not present

## 2019-10-02 DIAGNOSIS — I7090 Unspecified atherosclerosis: Secondary | ICD-10-CM | POA: Diagnosis not present

## 2019-10-02 DIAGNOSIS — J84112 Idiopathic pulmonary fibrosis: Secondary | ICD-10-CM

## 2019-10-02 DIAGNOSIS — E7800 Pure hypercholesterolemia, unspecified: Secondary | ICD-10-CM

## 2019-10-02 NOTE — Progress Notes (Signed)
[name] 68 y.o. male Nutrition Note Spoke with pt. Nutrition Plan and Nutrition Survey goals reviewed with pt. Pt's wife does all cooking and will make recommended changes for pt diet. Pt wants to lose wt. Pt has been trying to lose wt by avoiding ice cream at night. Wt loss tips reviewed (label reading, how to build a healthy plate, portion sizes, eating frequently across the day).  Pt sedentary due to balance issues. Pt reports sitting most of the day. Pt eats 3 meals per day and 2 small snacks with [place]. Discussed avoiding gravies, creamy and cheesy sauces, and butters. Encouraged veggie and/or fruit with each meal. Provided handouts and instructions for wife.  Pt expressed understanding of the information reviewed.   No results found for: HGBA1C  Wt Readings from Last 3 Encounters:  10/02/19 245 lb (111.1 kg)  09/30/19 245 lb 2.4 oz (111.2 kg)  09/29/19 248 lb (112.5 kg)    Nutrition Diagnosis ? Food-and nutrition-related knowledge deficit related to lack of exposure to information as related to diagnosis of: ? CVD ?  ? Obese  II = 35-39.9 related to excessive energy intake as evidenced by a 38.37 ? Excessive sodium intake related to over consumption of processed food as evidenced by frequent consumption of convenience food/ canned vegetables and eating out frequently.  Nutrition Intervention ? Pt's individual nutrition plan reviewed with pt. ? Benefits of adopting Heart Healthy diet discussed when Medficts reviewed.  ? Continue client-centered nutrition education by RD, as part of interdisciplinary care.  Goal(s)  ? Pt to identify food quantities necessary to achieve weight loss of 6-24 lb at graduation from cardiac rehab.  ? Pt to build a healthy plate including vegetables, fruits, whole grains, and low-fat dairy products in a heart healthy meal plan.  Plan:   Will provide client-centered nutrition education as part of interdisciplinary care  Monitor and evaluate  progress toward nutrition goal with team.   Michaele Offer, MS, RDN, LDN

## 2019-10-02 NOTE — Progress Notes (Signed)
Cardiology Office Note:    Date:  10/02/2019   ID:  Stephen [name], DOB [birth year], MRN [id number]  PCP:  Stephen Hipps, MD  Cardiologist:  Jenean Lindau, MD   Referring MD: Stephen Hipps, MD    ASSESSMENT:    No diagnosis found. PLAN:    In order of problems listed above:  1. Bradycardia: There are concerns about his bradycardia.  I will get blood work report from his primary care physician including thyroid panel and also I would like to review his lipids.  In addition I would like a 2-week ZIO monitoring for this gentleman with these a forementioned findings.  He is asymptomatic from that standpoint.  He occasionally has palpitations. 2. Coronary artery disease: Stable at this time and asymptomatic.  Has multiple other comorbidities.  Secondary prevention stressed.  Importance of compliance with diet and medication stressed and weight reduction stressed.  Risks of obesity explained and he vocalized understanding.  Lipids followed by his primary care physician. 3. Patient will be seen in follow-up appointment in 6 months or earlier if the patient has any concerns    Medication Adjustments/Labs and Tests Ordered: Current medicines are reviewed at length with the patient today.  Concerns regarding medicines are outlined above.  No orders of the defined types were placed in this encounter.  No orders of the defined types were placed in this encounter.    Chief Complaint  Patient presents with   Annual Exam     History of Present Illness:    Stephen [name] is a 68 y.o. male.  Patient has history of idiopathic pulmonary fibrosis, coronary artery disease, essential hypertension dyslipidemia and obesity.  He denies any problems at this time.  He was monitored by his pulmonologist and his heart rate dipped into the 45-50 range and therefore he was sent here for evaluation.  At the time of my evaluation, the patient is alert awake oriented and in no distress.  For obvious reasons  he leads a sedentary lifestyle.  Past Medical History:  Diagnosis Date   Allergy    Arthritis    GERD (gastroesophageal reflux disease)    Heart murmur    Hiatal hernia    Hyperlipidemia    Hypertension    Sleep apnea    Vertigo     Past Surgical History:  Procedure Laterality Date   CARDIAC CATHETERIZATION  03/22/2001   LEG SURGERY Bilateral 04/2001    Current Medications: Current Meds  Medication Sig   Pirfenidone (ESBRIET) 267 MG CAPS Take 267 mg by mouth 3 (three) times daily. Takes 3 caps three times daily     Allergies:   Ciprofloxacin and Codeine   Social History   Socioeconomic History   Marital status: Married    Spouse name: Not on file   Number of children: Not on file   Years of education: Not on file   Highest education level: Not on file  Occupational History   Not on file  Social Needs   Financial resource strain: Not on file   Food insecurity    Worry: Not on file    Inability: Not on file   Transportation needs    Medical: Not on file    Non-medical: Not on file  Tobacco Use   Smoking status: Never Smoker   Smokeless tobacco: Never Used  Substance and Sexual Activity   Alcohol use: Not on file   Drug use: Not on file   Sexual activity:  Not on file  Lifestyle   Physical activity    Days per week: Not on file    Minutes per session: Not on file   Stress: Not on file  Relationships   Social connections    Talks on phone: Not on file    Gets together: Not on file    Attends religious service: Not on file    Active member of club or organization: Not on file    Attends meetings of clubs or organizations: Not on file    Relationship status: Not on file  Other Topics Concern   Not on file  Social History Narrative   Not on file     Family History: The patient's family history includes Aneurysm in his father and maternal grandmother; Cancer in his maternal grandmother; Dementia in his mother; Heart attack  in his father.  ROS:   Please see the history of present illness.    All other systems reviewed and are negative.  EKGs/Labs/Other Studies Reviewed:    The following studies were reviewed today: Study Highlights    Nuclear stress EF: 67%.  There was no ST segment deviation noted during stress.  No T wave inversion was noted during stress.  The study is normal.  This is a low risk study.  The left ventricular ejection fraction is normal (55-65%).        Recent Labs: 06/05/2019: BUN 7; Creatinine, Ser 0.70; Hemoglobin 15.2; Platelets 301.0; Potassium 4.0; Sodium 127 09/29/2019: ALT 24  Recent Lipid Panel No results found for: CHOL, TRIG, HDL, CHOLHDL, VLDL, LDLCALC, LDLDIRECT  Physical Exam:    VS:  BP 138/72 (BP Location: Right Arm, Patient Position: Sitting, Cuff Size: Normal)    Pulse 72    Ht 5' 7" (1.702 m)    Wt 245 lb (111.1 kg)    SpO2 91%    BMI 38.37 kg/m     Wt Readings from Last 3 Encounters:  10/02/19 245 lb (111.1 kg)  09/30/19 245 lb 2.4 oz (111.2 kg)  09/29/19 248 lb (112.5 kg)     GEN: Patient is in no acute distress HEENT: Normal NECK: No JVD; No carotid bruits LYMPHATICS: No lymphadenopathy CARDIAC: Hear sounds regular, 2/6 systolic murmur at the apex. RESPIRATORY:  Clear to auscultation without rales, wheezing or rhonchi  ABDOMEN: Soft, non-tender, non-distended MUSCULOSKELETAL:  No edema; No deformity  SKIN: Warm and dry NEUROLOGIC:  Alert and oriented x 3 PSYCHIATRIC:  Normal affect   Signed, Jenean Lindau, MD  10/02/2019 10:32 AM    [place]

## 2019-10-02 NOTE — Patient Instructions (Signed)
Medication Instructions:  Your physician recommends that you continue on your current medications as directed. Please refer to the Current Medication list given to you today.  *If you need a refill on your cardiac medications before your next appointment, please call your pharmacy*  Lab Work: NONE If you have labs (blood work) drawn today and your tests are completely normal, you will receive your results only by: Marland Kitchen MyChart Message (if you have MyChart) OR . A paper copy in the mail If you have any lab test that is abnormal or we need to change your treatment, we will call you to review the results.  Testing/Procedures: You had an EKG performed today  Your physician has recommended that you wear a ZIO monitor. ZIO monitors are medical devices that record the heart's electrical activity. Doctors most often use these monitors to diagnose arrhythmias. Arrhythmias are problems with the speed or rhythm of the heartbeat. The monitor is a small, portable device. You can wear one while you do your normal daily activities. This is usually used to diagnose what is causing palpitations/syncope (passing out).You will wear this device for 14 days    Follow-Up: At [facility], you and your health needs are our priority.  As part of our continuing mission to provide you with exceptional heart care, we have created designated Provider Care Teams.  These Care Teams include your primary Cardiologist (physician) and Advanced Practice Providers (APPs -  Physician Assistants and Nurse Practitioners) who all work together to provide you with the care you need, when you need it.  Your next appointment:   6 month(s)  The format for your next appointment:   In Person  Provider:   Jyl Heinz, MD

## 2019-10-02 NOTE — Progress Notes (Signed)
Daily Session Note  Patient Details  Name: Stephen [name] MRN: [id number] Date of Birth: 05/19/1951 Referring Provider:     Pulmonary Rehab Walk Test from 09/11/2019 in [facility]  Referring Provider  Dr. Ramaswamy      Encounter Date: 10/02/2019  Check In: Session Check In - 10/02/19 1445      Check-In   Supervising physician immediately available to respond to emergencies  [facility] immediately available    Physician(s)  Dr. Swayze    Location  [facility]    Staff Present  Joan Behrens, RN, BSN;Dalton Fletcher, MS, Exercise Physiologist;Lisa Hughes, RN    Virtual Visit  No    Medication changes reported      No    Fall or balance concerns reported     No    Tobacco Cessation  No Change    Warm-up and Cool-down  Performed on first and last piece of equipment    Resistance Training Performed  Yes    VAD Patient?  No    PAD/SET Patient?  No      Pain Assessment   Currently in Pain?  No/denies    Multiple Pain Sites  No       Capillary Blood Glucose: No results found for this or any previous visit (from the past 24 hour(s)).    Social History   Tobacco Use  Smoking Status Never Smoker  Smokeless Tobacco Never Used    Goals Met:  Exercise tolerated well No report of cardiac concerns or symptoms Strength training completed today  Goals Unmet:  Not Applicable  Comments: Service time is from 1330 to 1449    Dr. Wesam G. Yacoub is Medical Director for Pulmonary Rehab at [facility]. 

## 2019-10-06 NOTE — Telephone Encounter (Signed)
Paperwork faxed for pt. Nothing further needed.

## 2019-10-07 ENCOUNTER — Encounter (HOSPITAL_COMMUNITY)
Admission: RE | Admit: 2019-10-07 | Discharge: 2019-10-07 | Disposition: A | Discharge status: Home / Self Care | Payer: PPO | Source: Ambulatory Visit | Attending: Internal Medicine | Admitting: Internal Medicine

## 2019-10-07 ENCOUNTER — Other Ambulatory Visit: Payer: Self-pay

## 2019-10-07 DIAGNOSIS — J84112 Idiopathic pulmonary fibrosis: Secondary | ICD-10-CM

## 2019-10-07 NOTE — Progress Notes (Signed)
Daily Session Note  Patient Details  Name: Stephen [name] MRN: [id number] Date of Birth: [birth year] Referring Provider:     Pulmonary Rehab Walk Test from 09/11/2019 in [place]  Referring Provider  Dr. Chase Caller      Encounter Date: 10/07/2019  Check In: Session Check In - 10/07/19 1447      Check-In   Supervising physician immediately available to respond to emergencies  [facility] immediately available    Physician(s)  Dr. Benny Lennert    Location  [facility]    Staff Present  Rosebud Poles, RN, Bjorn Loser, MS, Exercise Physiologist;Lisa Ysidro Evert, RN    Virtual Visit  No    Medication changes reported      No    Fall or balance concerns reported     No    Tobacco Cessation  No Change    Warm-up and Cool-down  Performed on first and last piece of equipment    Resistance Training Performed  Yes    VAD Patient?  No    PAD/SET Patient?  No      Pain Assessment   Currently in Pain?  No/denies    Multiple Pain Sites  No       Capillary Blood Glucose: No results found for this or any previous visit (from the past 24 hour(s)).    Social History   Tobacco Use  Smoking Status Never Smoker  Smokeless Tobacco Never Used    Goals Met:  [place] with exercise equipment Exercise tolerated well Strength training completed today  Goals Unmet:  Not Applicable  Comments: Service time is from 1315 to 1422    Dr. Rush Farmer is Medical Director for Pulmonary Rehab at [facility].

## 2019-10-09 ENCOUNTER — Other Ambulatory Visit: Payer: Self-pay

## 2019-10-09 ENCOUNTER — Encounter (HOSPITAL_COMMUNITY)
Admission: RE | Admit: 2019-10-09 | Discharge: 2019-10-09 | Disposition: A | Discharge status: Home / Self Care | Payer: PPO | Source: Ambulatory Visit | Attending: Internal Medicine | Admitting: Internal Medicine

## 2019-10-09 DIAGNOSIS — J84112 Idiopathic pulmonary fibrosis: Secondary | ICD-10-CM

## 2019-10-13 NOTE — Progress Notes (Signed)
Daily Session Note  Patient Details  Name: Stephen [name] MRN: [id number] Date of Birth: Jan 23, 1951 Referring Provider:     Pulmonary Rehab Walk Test from 09/11/2019 in [place]  Referring Provider  Dr. Chase Caller      Encounter Date: 10/09/2019  Check In:   Capillary Blood Glucose: No results found for this or any previous visit (from the past 24 hour(s)).    Social History   Tobacco Use  Smoking Status Never Smoker  Smokeless Tobacco Never Used    Goals Met:  Proper associated with RPD/PD & O2 Sat Exercise tolerated well Strength training completed today  Goals Unmet:  Not Applicable  Comments: Service time is from 1320 to 1430.    Dr. Rush Farmer is Medical Director for Pulmonary Rehab at [facility].

## 2019-10-14 ENCOUNTER — Encounter (HOSPITAL_COMMUNITY)
Admission: RE | Admit: 2019-10-14 | Discharge: 2019-10-14 | Disposition: A | Discharge status: Home / Self Care | Payer: PPO | Source: Ambulatory Visit | Attending: Internal Medicine | Admitting: Internal Medicine

## 2019-10-14 ENCOUNTER — Other Ambulatory Visit: Payer: Self-pay

## 2019-10-14 VITALS — Wt 244.9 lb

## 2019-10-14 DIAGNOSIS — J84112 Idiopathic pulmonary fibrosis: Secondary | ICD-10-CM

## 2019-10-14 NOTE — Progress Notes (Signed)
Pulmonary Individual Treatment Plan  Patient Details  Name: Stephen [name] MRN: [id number] Date of Birth: 24-Feb-1951 Referring Provider:     Pulmonary Rehab Walk Test from 09/11/2019 in [place]  Referring Provider  Dr. Chase Caller      Initial Encounter Date:    Pulmonary Rehab Walk Test from 09/11/2019 in [place]  Date  09/11/19      Visit Diagnosis: Idiopathic pulmonary fibrosis ([place])  Patient's Home Medications on Admission:   Current Outpatient Medications:  .  amitriptyline (ELAVIL) 75 MG tablet, Take 75 mg by mouth at bedtime., Disp: , Rfl:  .  amLODipine (NORVASC) 10 MG tablet, Take 10 mg by mouth daily., Disp: , Rfl:  .  aspirin EC 81 MG tablet, Take 81 mg by mouth daily., Disp: , Rfl:  .  calcium carbonate (OSCAL) 1500 (600 Ca) MG TABS tablet, Take by mouth 2 (two) times daily with a meal., Disp: , Rfl:  .  calcium carbonate (TUMS - DOSED IN MG ELEMENTAL CALCIUM) 500 MG chewable tablet, Chew 1 tablet by mouth daily., Disp: , Rfl:  .  cholecalciferol (VITAMIN D) 1000 units tablet, Take 1,000 Units by mouth daily., Disp: , Rfl:  .  cloNIDine (CATAPRES) 0.3 MG tablet, Take 0.3 mg by mouth daily after supper. , Disp: , Rfl:  .  dicyclomine (BENTYL) 10 MG capsule, Take 10 mg by mouth 3 (three) times daily before meals. , Disp: , Rfl:  .  lansoprazole (PREVACID) 30 MG capsule, Take 30 mg by mouth daily at 12 noon., Disp: , Rfl:  .  Methylcellulose, Laxative, (CITRUCEL PO), Take 1 Scoop by mouth., Disp: , Rfl:  .  metoprolol tartrate (LOPRESSOR) 50 MG tablet, Take 1 tablet by mouth 2 (two) times daily., Disp: , Rfl:  .  montelukast (SINGULAIR) 10 MG tablet, Take 10 mg by mouth at bedtime., Disp: , Rfl:  .  Multiple Vitamin (MULTIVITAMIN WITH MINERALS) TABS tablet, Take 1 tablet by mouth daily., Disp: , Rfl:  .  nabumetone (RELAFEN) 750 MG tablet, Take 750 mg by mouth 2 (two) times daily., Disp: , Rfl:  .   olmesartan (BENICAR) 20 MG tablet, Take 1 tablet by mouth daily., Disp: , Rfl:  .  Pirfenidone (ESBRIET) 267 MG CAPS, Take 801 mg by mouth 3 (three) times daily. Takes 3 caps three times daily , Disp: , Rfl:  .  Pirfenidone (ESBRIET) 801 MG TABS, Take 1 tablet by mouth 3 (three) times daily., Disp: 270 tablet, Rfl: 0 .  polyethylene glycol (MIRALAX / GLYCOLAX) packet, Take 17 g by mouth daily., Disp: , Rfl:  .  Probiotic Product (PROBIOTIC DAILY PO), Take 1 capsule by mouth daily., Disp: , Rfl:  .  rosuvastatin (CRESTOR) 10 MG tablet, Take 10 mg by mouth daily., Disp: , Rfl:  .  sodium chloride 1 g tablet, Take 3 g by mouth 3 (three) times daily. , Disp: , Rfl:  .  spironolactone (ALDACTONE) 25 MG tablet, Take 25 mg by mouth 3 (three) times a week., Disp: , Rfl:  .  UNABLE TO FIND, Take 1 capsule by mouth 3 (three) times daily. Med Name: blood sugar harmony, Disp: , Rfl:  .  UNABLE TO FIND, Take 1 capsule by mouth 3 (three) times daily. Med Name: Clear lungs, Disp: , Rfl:  .  vitamin B-12 (CYANOCOBALAMIN) 1000 MCG tablet, Take 1,000 mcg by mouth daily., Disp: , Rfl:  .  vitamin E 400 UNIT capsule,  Take 400 Units by mouth daily., Disp: , Rfl:   Past Medical History: Past Medical History:  Diagnosis Date  . Allergy   . Arthritis   . GERD (gastroesophageal reflux disease)   . Heart murmur   . Hiatal hernia   . Hyperlipidemia   . Hypertension   . Sleep apnea   . Vertigo     Tobacco Use: Social History   Tobacco Use  Smoking Status Never Smoker  Smokeless Tobacco Never Used    Labs: Recent Review Flowsheet Data    There is no flowsheet data to display.      Capillary Blood Glucose: No results found for: GLUCAP   Pulmonary Assessment Scores: Pulmonary Assessment Scores    Row Name 09/11/19 1515         ADL UCSD   ADL Phase  Entry     SOB Score total  38       CAT Score   CAT Score  17       UCSD: Self-administered rating of dyspnea associated with activities of  daily living (ADLs) 6-point scale (0 = "not at all" to 5 = "maximal or unable to do because of breathlessness")  Scoring Scores range from 0 to 120.  Minimally important difference is 5 units  CAT: CAT can identify the health impairment of COPD patients and is better correlated with disease progression.  CAT has a scoring range of zero to 40. The CAT score is classified into four groups of low (less than 10), medium (10 - 20), high (21-30) and very high (31-40) based on the impact level of disease on health status. A CAT score over 10 suggests significant symptoms.  A worsening CAT score could be explained by an exacerbation, poor medication adherence, poor inhaler technique, or progression of COPD or comorbid conditions.  CAT MCID is 2 points  mMRC: mMRC (Modified Medical Research Council) Dyspnea Scale is used to assess the degree of baseline functional disability in patients of respiratory disease due to dyspnea. No minimal important difference is established. A decrease in score of 1 point or greater is considered a positive change.   Pulmonary Function Assessment: Pulmonary Function Assessment - 09/11/19 1443      Breath   Bilateral Breath Sounds  Clear    Shortness of Breath  Yes;Limiting activity       Exercise Target Goals: Exercise Program Goal: Individual exercise prescription set using results from initial 6 min walk test and THRR while considering  patient's activity barriers and safety.   Exercise Prescription Goal: Initial exercise prescription builds to 30-45 minutes a day of aerobic activity, 2-3 days per week.  Home exercise guidelines will be given to patient during program as part of exercise prescription that the participant will acknowledge.  Activity Barriers & Risk Stratification: Activity Barriers & Cardiac Risk Stratification - 09/11/19 1441      Activity Barriers & Cardiac Risk Stratification   Activity Barriers  Arthritis;Deconditioning;Muscular  Weakness;Shortness of Breath       6 Minute Walk: 6 Minute Walk    Row Name 09/11/19 1533         6 Minute Walk   Phase  Initial     Distance  600 feet     Walk Time  5.25 minutes     # of Rest Breaks  1     MPH  1.14     METS  1.34     RPE  13     Perceived Dyspnea  2     VO2 Peak  4.69     Symptoms  Yes (comment)     Comments  45 sec standing break due to desaturation     Resting HR  78 bpm     Resting BP  134/80     Resting Oxygen Saturation   96 %     Exercise Oxygen Saturation  during 6 min walk  80 %     Max Ex. HR  104 bpm     Max Ex. BP  152/82     2 Minute Post BP  132/84       Interval HR   1 Minute HR  97     2 Minute HR  101     3 Minute HR  104     4 Minute HR  93     5 Minute HR  94     6 Minute HR  96     2 Minute Post HR  91     Interval Heart Rate?  Yes       Interval Oxygen   Interval Oxygen?  Yes     Baseline Oxygen Saturation %  96 %     1 Minute Oxygen Saturation %  90 %     1 Minute Liters of Oxygen  0 L     2 Minute Oxygen Saturation %  86 %     2 Minute Liters of Oxygen  0 L     3 Minute Oxygen Saturation %  84 %     3 Minute Liters of Oxygen  0 L     4 Minute Oxygen Saturation %  80 %     4 Minute Liters of Oxygen  2 L     5 Minute Oxygen Saturation %  92 %     5 Minute Liters of Oxygen  2 L     6 Minute Oxygen Saturation %  93 %     6 Minute Liters of Oxygen  2 L     2 Minute Post Oxygen Saturation %  97 %     2 Minute Post Liters of Oxygen  2 L        Oxygen Initial Assessment: Oxygen Initial Assessment - 09/11/19 1532      Home Oxygen   Home Oxygen Device  None    Sleep Oxygen Prescription  CPAP    Home Exercise Oxygen Prescription  Continuous    Liters per minute  2    Home at Rest Exercise Oxygen Prescription  None    Compliance with Home Oxygen Use  Yes      Initial 6 min Walk   Oxygen Used  Continuous    Liters per minute  2      Program Oxygen Prescription   Program Oxygen Prescription  Continuous    Liters  per minute  2      Intervention   Short Term Goals  To learn and exhibit compliance with exercise, home and travel O2 prescription;To learn and understand importance of monitoring SPO2 with pulse oximeter and demonstrate accurate use of the pulse oximeter.;To learn and understand importance of maintaining oxygen saturations>88%;To learn and demonstrate proper pursed lip breathing techniques or other breathing techniques.;To learn and demonstrate proper use of respiratory medications    Long  Term Goals  Exhibits compliance with exercise, home and travel O2 prescription;Maintenance of O2 saturations>88%;Verbalizes importance of monitoring SPO2 with pulse oximeter and return demonstration;Exhibits proper  breathing techniques, such as pursed lip breathing or other method taught during program session;Compliance with respiratory medication       Oxygen Re-Evaluation: Oxygen Re-Evaluation    [place] Name 09/16/19 1456 10/13/19 1021           Program Oxygen Prescription   Program Oxygen Prescription  Continuous  Continuous      Liters per minute  2  2        Home Oxygen   Home Oxygen Device  None  E-Tanks      Sleep Oxygen Prescription  CPAP  CPAP      Home Exercise Oxygen Prescription  Continuous  Continuous      Liters per minute  2  2      Home at Rest Exercise Oxygen Prescription  None  None      Compliance with Home Oxygen Use  Yes  Yes        Goals/Expected Outcomes   Short Term Goals  To learn and exhibit compliance with exercise, home and travel O2 prescription;To learn and understand importance of monitoring SPO2 with pulse oximeter and demonstrate accurate use of the pulse oximeter.;To learn and understand importance of maintaining oxygen saturations>88%;To learn and demonstrate proper pursed lip breathing techniques or other breathing techniques.;To learn and demonstrate proper use of respiratory medications  To learn and exhibit compliance with exercise, home and travel O2  prescription;To learn and understand importance of monitoring SPO2 with pulse oximeter and demonstrate accurate use of the pulse oximeter.;To learn and understand importance of maintaining oxygen saturations>88%;To learn and demonstrate proper pursed lip breathing techniques or other breathing techniques.;To learn and demonstrate proper use of respiratory medications      Long  Term Goals  Exhibits compliance with exercise, home and travel O2 prescription;Maintenance of O2 saturations>88%;Verbalizes importance of monitoring SPO2 with pulse oximeter and return demonstration;Exhibits proper breathing techniques, such as pursed lip breathing or other method taught during program session;Compliance with respiratory medication  Exhibits compliance with exercise, home and travel O2 prescription;Maintenance of O2 saturations>88%;Verbalizes importance of monitoring SPO2 with pulse oximeter and return demonstration;Exhibits proper breathing techniques, such as pursed lip breathing or other method taught during program session;Compliance with respiratory medication      Goals/Expected Outcomes  compliance  compliance         Oxygen Discharge (Final Oxygen Re-Evaluation): Oxygen Re-Evaluation - 10/13/19 1021      Program Oxygen Prescription   Program Oxygen Prescription  Continuous    Liters per minute  2      Home Oxygen   Home Oxygen Device  E-Tanks    Sleep Oxygen Prescription  CPAP    Home Exercise Oxygen Prescription  Continuous    Liters per minute  2    Home at Rest Exercise Oxygen Prescription  None    Compliance with Home Oxygen Use  Yes      Goals/Expected Outcomes   Short Term Goals  To learn and exhibit compliance with exercise, home and travel O2 prescription;To learn and understand importance of monitoring SPO2 with pulse oximeter and demonstrate accurate use of the pulse oximeter.;To learn and understand importance of maintaining oxygen saturations>88%;To learn and demonstrate proper  pursed lip breathing techniques or other breathing techniques.;To learn and demonstrate proper use of respiratory medications    Long  Term Goals  Exhibits compliance with exercise, home and travel O2 prescription;Maintenance of O2 saturations>88%;Verbalizes importance of monitoring SPO2 with pulse oximeter and return demonstration;Exhibits proper breathing techniques, such as pursed lip breathing  or other method taught during program session;Compliance with respiratory medication    Goals/Expected Outcomes  compliance       Initial Exercise Prescription: Initial Exercise Prescription - 09/11/19 1500      Date of Initial Exercise RX and Referring Provider   Date  09/11/19    Referring Provider  Dr. Chase Caller      Oxygen   Oxygen  Continuous    Liters  2      NuStep   Level  2    SPM  80    Minutes  15      Arm Ergometer   Level  1    Minutes  15      Prescription Details   Frequency (times per week)  2    Duration  Progress to 30 minutes of continuous aerobic without signs/symptoms of physical distress      Intensity   THRR 40-80% of Max Heartrate  61-122    Ratings of Perceived Exertion  11-13    Perceived Dyspnea  0-4      Progression   Progression  Continue progressive overload as per policy without signs/symptoms or physical distress.      Resistance Training   Training Prescription  Yes    Weight  blue bands    Reps  10-15       Perform Capillary Blood Glucose checks as needed.  Exercise Prescription Changes: Exercise Prescription Changes    Row Name 09/16/19 1500 09/30/19 1500 10/14/19 1500         Response to Exercise   Blood Pressure (Admit)  140/80  136/60  158/70     Blood Pressure (Exercise)  144/80  136/76  160/80     Blood Pressure (Exit)  140/82  128/70  118/78     Heart Rate (Admit)  73 bpm  78 bpm  68 bpm     Heart Rate (Exercise)  84 bpm  91 bpm  78 bpm     Heart Rate (Exit)  79 bpm  80 bpm  72 bpm     Oxygen Saturation (Admit)  97 %  94  %  99 %     Oxygen Saturation (Exercise)  97 %  96 %  96 %     Oxygen Saturation (Exit)  98 %  97 %  99 %     Rating of Perceived Exertion (Exercise)  _0 Perceived Dyspnea (Exercise)  _1 Duration  Continue with 30 min of aerobic exercise without signs/symptoms of physical distress.  Continue with 30 min of aerobic exercise without signs/symptoms of physical distress.  Continue with 30 min of aerobic exercise without signs/symptoms of physical distress.     Intensity  THRR unchanged  THRR unchanged  THRR unchanged       Progression   Progression  Continue to progress workloads to maintain intensity without signs/symptoms of physical distress.  Continue to progress workloads to maintain intensity without signs/symptoms of physical distress.  Continue to progress workloads to maintain intensity without signs/symptoms of physical distress.       Resistance Training   Training Prescription  Yes  Yes  Yes     Weight  blue bands  blue bands  blue bands     Reps  10-15  10-15  10-15     Time  10 Minutes  10 Minutes  10 Minutes  Oxygen   Oxygen  Continuous  Continuous  Continuous     Liters  _0 NuStep   Level  _1 SPM  80  80  80     Minutes  _2 METs  1.7  1.7  1.7       Arm Ergometer   Level  _3 Minutes  _4 Exercise Comments:   Exercise Goals and Review: Exercise Goals    Row Name 09/11/19 1548 09/16/19 1457 10/13/19 1022         Exercise Goals   Increase Physical Activity  Yes  Yes  Yes     Intervention  Provide advice, education, support and counseling about physical activity/exercise needs.;Develop an individualized exercise prescription for aerobic and resistive training based on initial evaluation findings, risk stratification, comorbidities and participant's personal goals.  Provide advice, education, support and counseling about physical activity/exercise needs.;Develop an individualized  exercise prescription for aerobic and resistive training based on initial evaluation findings, risk stratification, comorbidities and participant's personal goals.  Provide advice, education, support and counseling about physical activity/exercise needs.;Develop an individualized exercise prescription for aerobic and resistive training based on initial evaluation findings, risk stratification, comorbidities and participant's personal goals.     Expected Outcomes  Long Term: Exercising regularly at least 3-5 days a week.;Long Term: Add in home exercise to make exercise part of routine and to increase amount of physical activity.;Short Term: Attend rehab on a regular basis to increase amount of physical activity.  Long Term: Exercising regularly at least 3-5 days a week.;Long Term: Add in home exercise to make exercise part of routine and to increase amount of physical activity.;Short Term: Attend rehab on a regular basis to increase amount of physical activity.  Long Term: Exercising regularly at least 3-5 days a week.;Long Term: Add in home exercise to make exercise part of routine and to increase amount of physical activity.;Short Term: Attend rehab on a regular basis to increase amount of physical activity.     Increase Strength and Stamina  Yes  Yes  Yes     Intervention  Provide advice, education, support and counseling about physical activity/exercise needs.;Develop an individualized exercise prescription for aerobic and resistive training based on initial evaluation findings, risk stratification, comorbidities and participant's personal goals.  Provide advice, education, support and counseling about physical activity/exercise needs.;Develop an individualized exercise prescription for aerobic and resistive training based on initial evaluation findings, risk stratification, comorbidities and participant's personal goals.  Provide advice, education, support and counseling about physical activity/exercise  needs.;Develop an individualized exercise prescription for aerobic and resistive training based on initial evaluation findings, risk stratification, comorbidities and participant's personal goals.     Expected Outcomes  Short Term: Increase workloads from initial exercise prescription for resistance, speed, and METs.;Short Term: Perform resistance training exercises routinely during rehab and add in resistance training at home;Long Term: Improve cardiorespiratory fitness, muscular endurance and strength as measured by increased METs and functional capacity (6MWT)  Short Term: Increase workloads from initial exercise prescription for resistance, speed, and METs.;Short Term: Perform resistance training exercises routinely during rehab and add in resistance training at home;Long Term: Improve cardiorespiratory fitness, muscular endurance and strength as measured by increased METs and functional capacity (6MWT)  Short Term: Increase workloads  from initial exercise prescription for resistance, speed, and METs.;Short Term: Perform resistance training exercises routinely during rehab and add in resistance training at home;Long Term: Improve cardiorespiratory fitness, muscular endurance and strength as measured by increased METs and functional capacity (6MWT)     Able to understand and use rate of perceived exertion (RPE) scale  Yes  Yes  Yes     Intervention  Provide education and explanation on how to use RPE scale  Provide education and explanation on how to use RPE scale  Provide education and explanation on how to use RPE scale     Expected Outcomes  Short Term: Able to use RPE daily in rehab to express subjective intensity level;Long Term:  Able to use RPE to guide intensity level when exercising independently  Short Term: Able to use RPE daily in rehab to express subjective intensity level;Long Term:  Able to use RPE to guide intensity level when exercising independently  Short Term: Able to use RPE daily in  rehab to express subjective intensity level;Long Term:  Able to use RPE to guide intensity level when exercising independently     Able to understand and use Dyspnea scale  Yes  Yes  Yes     Intervention  Provide education and explanation on how to use Dyspnea scale  Provide education and explanation on how to use Dyspnea scale  Provide education and explanation on how to use Dyspnea scale     Expected Outcomes  Short Term: Able to use Dyspnea scale daily in rehab to express subjective sense of shortness of breath during exertion;Long Term: Able to use Dyspnea scale to guide intensity level when exercising independently  Short Term: Able to use Dyspnea scale daily in rehab to express subjective sense of shortness of breath during exertion;Long Term: Able to use Dyspnea scale to guide intensity level when exercising independently  Short Term: Able to use Dyspnea scale daily in rehab to express subjective sense of shortness of breath during exertion;Long Term: Able to use Dyspnea scale to guide intensity level when exercising independently     Knowledge and understanding of Target Heart Rate Range (THRR)  Yes  Yes  Yes     Intervention  --  --  Provide education and explanation of THRR including how the numbers were predicted and where they are located for reference     Expected Outcomes  Short Term: Able to state/look up THRR;Short Term: Able to use daily as guideline for intensity in rehab;Long Term: Able to use THRR to govern intensity when exercising independently  Short Term: Able to state/look up THRR;Short Term: Able to use daily as guideline for intensity in rehab;Long Term: Able to use THRR to govern intensity when exercising independently  Short Term: Able to state/look up THRR;Short Term: Able to use daily as guideline for intensity in rehab;Long Term: Able to use THRR to govern intensity when exercising independently     Understanding of Exercise Prescription  Yes  Yes  Yes     Intervention   Provide education, explanation, and written materials on patient's individual exercise prescription  Provide education, explanation, and written materials on patient's individual exercise prescription  Provide education, explanation, and written materials on patient's individual exercise prescription     Expected Outcomes  Short Term: Able to explain program exercise prescription;Long Term: Able to explain home exercise prescription to exercise independently  Short Term: Able to explain program exercise prescription;Long Term: Able to explain home exercise prescription to exercise independently  Short  Term: Able to explain program exercise prescription;Long Term: Able to explain home exercise prescription to exercise independently        Exercise Goals Re-Evaluation : Exercise Goals Re-Evaluation    [place] Name 09/16/19 1458 10/13/19 1022           Exercise Goal Re-Evaluation   Exercise Goals Review  Increase Physical Activity;Increase Strength and Stamina;Able to understand and use rate of perceived exertion (RPE) scale;Able to understand and use Dyspnea scale;Knowledge and understanding of Target Heart Rate Range (THRR);Understanding of Exercise Prescription  Increase Physical Activity;Increase Strength and Stamina;Able to understand and use rate of perceived exertion (RPE) scale;Able to understand and use Dyspnea scale;Knowledge and understanding of Target Heart Rate Range (THRR);Understanding of Exercise Prescription      Comments  Pt has attended 1 exercise session. Pt exercised at 1.7 METs on the stepper. Will monitor and progress as able.  Pt has attended 7 exercise sessions. Pt is progressing slow, but maintains a positive outlook. Pt currently exercises at 1.7 METs on the stepper. Will continue to monitor and progress as able.      Expected Outcomes  Through exercise at rehab and at home, the patient will decrease shortness of breath with daily activities and feel confident in carrying out an  exercise regime at home.  Through exercise at rehab and at home, the patient will decrease shortness of breath with daily activities and feel confident in carrying out an exercise regime at home.         Discharge Exercise Prescription (Final Exercise Prescription Changes): Exercise Prescription Changes - 10/14/19 1500      Response to Exercise   Blood Pressure (Admit)  158/70    Blood Pressure (Exercise)  160/80    Blood Pressure (Exit)  118/78    Heart Rate (Admit)  68 bpm    Heart Rate (Exercise)  78 bpm    Heart Rate (Exit)  72 bpm    Oxygen Saturation (Admit)  99 %    Oxygen Saturation (Exercise)  96 %    Oxygen Saturation (Exit)  99 %    Rating of Perceived Exertion (Exercise)  13    Perceived Dyspnea (Exercise)  1    Duration  Continue with 30 min of aerobic exercise without signs/symptoms of physical distress.    Intensity  THRR unchanged      Progression   Progression  Continue to progress workloads to maintain intensity without signs/symptoms of physical distress.      Resistance Training   Training Prescription  Yes    Weight  blue bands    Reps  10-15    Time  10 Minutes      Oxygen   Oxygen  Continuous    Liters  2      NuStep   Level  3    SPM  80    Minutes  15    METs  1.7      Arm Ergometer   Level  2    Minutes  15       Nutrition:  Target Goals: Understanding of nutrition guidelines, daily intake of sodium <1593m, cholesterol <2055m calories 30% from fat and 7% or less from saturated fats, daily to have 5 or more servings of fruits and vegetables.  Biometrics: Pre Biometrics - 09/11/19 1442      Pre Biometrics   Height  5' 7" (1.702 m)    Weight  112.2 kg    BMI (Calculated)  38.73  Nutrition Therapy Plan and Nutrition Goals: Nutrition Therapy & Goals - 10/02/19 1519      Nutrition Therapy   Diet  Low Sodium/Therapeutic lifestyle changes    Drug/Food Interactions  Statins/Certain Fruits      Personal Nutrition Goals    Nutrition Goal  Pt to identify food quantities necessary to achieve weight loss of 6-24 lb at graduation from cardiac rehab.    Personal Goal #2  Pt to build a healthy plate including vegetables, fruits, whole grains, and low-fat dairy products in a heart healthy meal plan.      Intervention Plan   Intervention  Prescribe, educate and counsel regarding individualized specific dietary modifications aiming towards targeted core components such as weight, hypertension, lipid management, diabetes, heart failure and other comorbidities.;Nutrition handout(s) given to patient.    Expected Outcomes  Short Term Goal: A plan has been developed with personal nutrition goals set during dietitian appointment.;Long Term Goal: Adherence to prescribed nutrition plan.       Nutrition Assessments: Nutrition Assessments - 09/19/19 0726      Rate Your Plate Scores   Pre Score  48       Nutrition Goals Re-Evaluation: Nutrition Goals Re-Evaluation    Row Name 10/02/19 1527             Goals   Current Weight  245 lb (111.1 kg)       Nutrition Goal  Pt to identify food quantities necessary to achieve weight loss of 6-24 lb at graduation from cardiac rehab.         Personal Goal #2 Re-Evaluation   Personal Goal #2  Pt to build a healthy plate including vegetables, fruits, whole grains, and low-fat dairy products in a heart healthy meal plan.          Nutrition Goals Discharge (Final Nutrition Goals Re-Evaluation): Nutrition Goals Re-Evaluation - 10/02/19 1527      Goals   Current Weight  245 lb (111.1 kg)    Nutrition Goal  Pt to identify food quantities necessary to achieve weight loss of 6-24 lb at graduation from cardiac rehab.      Personal Goal #2 Re-Evaluation   Personal Goal #2  Pt to build a healthy plate including vegetables, fruits, whole grains, and low-fat dairy products in a heart healthy meal plan.       Psychosocial: Target Goals: Acknowledge presence or absence of significant  depression and/or stress, maximize coping skills, provide positive support system. Participant is able to verbalize types and ability to use techniques and skills needed for reducing stress and depression.  Initial Review & Psychosocial Screening: Initial Psych Review & Screening - 09/11/19 1444      Initial Review   Current issues with  None Identified      Family Dynamics   Good Support System?  Yes      Barriers   Psychosocial barriers to participate in program  There are no identifiable barriers or psychosocial needs.      Screening Interventions   Interventions  Encouraged to exercise       Quality of Life Scores:  Scores of 19 and below usually indicate a poorer quality of life in these areas.  A difference of  2-3 points is a clinically meaningful difference.  A difference of 2-3 points in the total score of the Quality of Life Index has been associated with significant improvement in overall quality of life, self-image, physical symptoms, and general health in studies assessing change in quality  of life.  PHQ-9: Recent Review Flowsheet Data    Depression screen [facility] 2/9 09/11/2019 09/11/2019   Decreased Interest - 0   Down, Depressed, Hopeless 0 0   PHQ - 2 Score 0 0   Altered sleeping 0 -   Tired, decreased energy 0 -   Change in appetite 0 -   Feeling bad or failure about yourself  0 -   Trouble concentrating 0 -   Moving slowly or fidgety/restless 0 -   Suicidal thoughts 0 -   PHQ-9 Score 0 -   Difficult doing work/chores Not difficult at all -     Interpretation of Total Score  Total Score Depression Severity:  1-4 = Minimal depression, 5-9 = Mild depression, 10-14 = Moderate depression, 15-19 = Moderately severe depression, 20-27 = Severe depression   Psychosocial Evaluation and Intervention: Psychosocial Evaluation - 09/11/19 1445      Psychosocial Evaluation & Interventions   Interventions  Encouraged to exercise with the program and follow exercise  prescription    Continue Psychosocial Services   No Follow up required       Psychosocial Re-Evaluation: Psychosocial Re-Evaluation    [place] Name 09/11/19 1445 09/16/19 1551 10/13/19 1326         Psychosocial Re-Evaluation   Current issues with  None Identified  None Identified  None Identified     Comments  --  no barriers identified  Kasandra Knudsen has no barriers or psychosocial concerns identified at this time.     Expected Outcomes  --  That patient will continue to have no barriers or psychosocial concerns while in pulmonary rehab.  That Kasandra Knudsen continues to have no barriers or psychsocial concerns while participating in pulmonary rehab.     Interventions  Encouraged to attend Pulmonary Rehabilitation for the exercise  --  Encouraged to attend Pulmonary Rehabilitation for the exercise     Continue Psychosocial Services   --  --  No Follow up required        Psychosocial Discharge (Final Psychosocial Re-Evaluation): Psychosocial Re-Evaluation - 10/13/19 1326      Psychosocial Re-Evaluation   Current issues with  None Identified    Comments  Kasandra Knudsen has no barriers or psychosocial concerns identified at this time.    Expected Outcomes  That Kasandra Knudsen continues to have no barriers or psychsocial concerns while participating in pulmonary rehab.    Interventions  Encouraged to attend Pulmonary Rehabilitation for the exercise    Continue Psychosocial Services   No Follow up required       Education: Education Goals: Education classes will be provided on a weekly basis, covering required topics. Participant will state understanding/return demonstration of topics presented.  Learning Barriers/Preferences:   Education Topics: Risk Factor Reduction:  -Group instruction that is supported by a PowerPoint presentation. Instructor discusses the definition of a risk factor, different risk factors for pulmonary disease, and how the heart and lungs work together.     PULMONARY REHAB OTHER RESPIRATORY from  09/30/2019 in [place]  Date  09/23/19  Educator  DF  Instruction Review Code  2- Demonstrated Understanding      Nutrition for Pulmonary Patient:  -Group instruction provided by PowerPoint slides, verbal discussion, and written materials to support subject matter. The instructor gives an explanation and review of healthy diet recommendations, which includes a discussion on weight management, recommendations for fruit and vegetable consumption, as well as protein, fluid, caffeine, fiber, sodium, sugar, and alcohol. Tips for eating when  patients are short of breath are discussed.   Pursed Lip Breathing:  -Group instruction that is supported by demonstration and informational handouts. Instructor discusses the benefits of pursed lip and diaphragmatic breathing and detailed demonstration on how to preform both.     Oxygen Safety:  -Group instruction provided by PowerPoint, verbal discussion, and written material to support subject matter. There is an overview of "What is Oxygen" and "Why do we need it".  Instructor also reviews how to create a safe environment for oxygen use, the importance of using oxygen as prescribed, and the risks of noncompliance. There is a brief discussion on traveling with oxygen and resources the patient may utilize.   Oxygen Equipment:  -Group instruction provided by [facility] Staff utilizing handouts, written materials, and equipment demonstrations.   Signs and Symptoms:  -Group instruction provided by written material and verbal discussion to support subject matter. Warning signs and symptoms of infection, stroke, and heart attack are reviewed and when to call the physician/911 reinforced. Tips for preventing the spread of infection discussed.   Advanced Directives:  -Group instruction provided by verbal instruction and written material to support subject matter. Instructor reviews Advanced Directive laws and proper instruction  for filling out document.   Pulmonary Video:  -Group video education that reviews the importance of medication and oxygen compliance, exercise, good nutrition, pulmonary hygiene, and pursed lip and diaphragmatic breathing for the pulmonary patient.   Exercise for the Pulmonary Patient:  -Group instruction that is supported by a PowerPoint presentation. Instructor discusses benefits of exercise, core components of exercise, frequency, duration, and intensity of an exercise routine, importance of utilizing pulse oximetry during exercise, safety while exercising, and options of places to exercise outside of rehab.     Pulmonary Medications:  -Verbally interactive group education provided by instructor with focus on inhaled medications and proper administration.   Anatomy and Physiology of the Respiratory System and Intimacy:  -Group instruction provided by PowerPoint, verbal discussion, and written material to support subject matter. Instructor reviews respiratory cycle and anatomical components of the respiratory system and their functions. Instructor also reviews differences in obstructive and restrictive respiratory diseases with examples of each. Intimacy, Sex, and Sexuality differences are reviewed with a discussion on how relationships can change when diagnosed with pulmonary disease. Common sexual concerns are reviewed.   PULMONARY REHAB OTHER RESPIRATORY from 09/30/2019 in [place]  Date  09/30/19  Educator  -- [Handout]      MD DAY -A group question and answer session with a medical doctor that allows participants to ask questions that relate to their pulmonary disease state.   OTHER EDUCATION -Group or individual verbal, written, or video instructions that support the educational goals of the pulmonary rehab program.   Holiday Eating Survival Tips:  -Group instruction provided by PowerPoint slides, verbal discussion, and written materials to  support subject matter. The instructor gives patients tips, tricks, and techniques to help them not only survive but enjoy the holidays despite the onslaught of food that accompanies the holidays.   PULMONARY REHAB OTHER RESPIRATORY from 09/30/2019 in [place]  Date  09/16/19  Educator  -- [handout]      Knowledge Questionnaire Score: Knowledge Questionnaire Score - 09/11/19 1513      Knowledge Questionnaire Score   Pre Score  8/18       Core Components/Risk Factors/Patient Goals at Admission: Personal Goals and Risk Factors at Admission - 09/11/19 1445  Core Components/Risk Factors/Patient Goals on Admission   Improve shortness of breath with ADL's  Yes    Intervention  Provide education, individualized exercise plan and daily activity instruction to help decrease symptoms of SOB with activities of daily living.    Expected Outcomes  Short Term: Improve cardiorespiratory fitness to achieve a reduction of symptoms when performing ADLs;Long Term: Be able to perform more ADLs without symptoms or delay the onset of symptoms       Core Components/Risk Factors/Patient Goals Review:  Goals and Risk Factor Review    Row Name 09/11/19 1445 09/16/19 1553 10/13/19 1328         Core Components/Risk Factors/Patient Goals Review   Personal Goals Review  Develop more efficient breathing techniques such as purse lipped breathing and diaphragmatic breathing and practicing self-pacing with activity.;Increase knowledge of respiratory medications and ability to use respiratory devices properly.;Improve shortness of breath with ADL's  (Pended)   Develop more efficient breathing techniques such as purse lipped breathing and diaphragmatic breathing and practicing self-pacing with activity.;Increase knowledge of respiratory medications and ability to use respiratory devices properly.;Improve shortness of breath with ADL's  Develop more efficient breathing techniques such  as purse lipped breathing and diaphragmatic breathing and practicing self-pacing with activity.;Increase knowledge of respiratory medications and ability to use respiratory devices properly.;Improve shortness of breath with ADL's     Review  --  Today was his first day of exercise class, too early to see progress towards admission goals.  Kasandra Knudsen has been in pulmonary rehab for 3 1/2 weeks.  He is exercising on level 2 of the arm ergomenter, and level 3 on the nustep.  His attendance is great and works hard while he is here.     Expected Outcomes  --  See admission goals.  See admission goals.        Core Components/Risk Factors/Patient Goals at Discharge (Final Review):  Goals and Risk Factor Review - 10/13/19 1328      Core Components/Risk Factors/Patient Goals Review   Personal Goals Review  Develop more efficient breathing techniques such as purse lipped breathing and diaphragmatic breathing and practicing self-pacing with activity.;Increase knowledge of respiratory medications and ability to use respiratory devices properly.;Improve shortness of breath with ADL's    Review  Kasandra Knudsen has been in pulmonary rehab for 3 1/2 weeks.  He is exercising on level 2 of the arm ergomenter, and level 3 on the nustep.  His attendance is great and works hard while he is here.    Expected Outcomes  See admission goals.       ITP Comments:   Comments: ITP REVIEW Pt is making expected progress toward pulmonary rehab goals after completing 8 sessions. Recommend continued exercise, life style modification, education, and utilization of breathing techniques to increase stamina and strength and decrease shortness of breath with exertion.

## 2019-10-14 NOTE — Progress Notes (Signed)
Daily Session Note  Patient Details  Name: Stephen [name] MRN: [id number] Date of Birth: 10/12/1951 Referring Provider:     Pulmonary Rehab Walk Test from 09/11/2019 in [place]  Referring Provider  Dr. Chase Caller      Encounter Date: 10/14/2019  Check In: Session Check In - 10/14/19 1511      Check-In   Supervising physician immediately available to respond to emergencies  [facility] immediately available    Physician(s)  Dr. Earnest Conroy    Location  [facility]    Staff Present  Rosebud Poles, RN, Bjorn Loser, MS, Exercise Physiologist;Tishawn Friedhoff Ysidro Evert, RN    Virtual Visit  No    Medication changes reported      No    Fall or balance concerns reported     No    Tobacco Cessation  No Change    Warm-up and Cool-down  Performed on first and last piece of equipment    Resistance Training Performed  Yes    VAD Patient?  No    PAD/SET Patient?  No      Pain Assessment   Currently in Pain?  No/denies    Multiple Pain Sites  No       Capillary Blood Glucose: No results found for this or any previous visit (from the past 24 hour(s)).  Exercise Prescription Changes - 10/14/19 1500      Response to Exercise   Blood Pressure (Admit)  158/70    Blood Pressure (Exercise)  160/80    Blood Pressure (Exit)  118/78    Heart Rate (Admit)  68 bpm    Heart Rate (Exercise)  78 bpm    Heart Rate (Exit)  72 bpm    Oxygen Saturation (Admit)  99 %    Oxygen Saturation (Exercise)  96 %    Oxygen Saturation (Exit)  99 %    Rating of Perceived Exertion (Exercise)  13    Perceived Dyspnea (Exercise)  1    Duration  Continue with 30 min of aerobic exercise without signs/symptoms of physical distress.    Intensity  THRR unchanged      Progression   Progression  Continue to progress workloads to maintain intensity without signs/symptoms of physical distress.      Resistance Training   Training Prescription  Yes    Weight  blue bands     Reps  10-15    Time  10 Minutes      Oxygen   Oxygen  Continuous    Liters  2      NuStep   Level  3    SPM  80    Minutes  15    METs  1.7      Arm Ergometer   Level  2    Minutes  15       Social History   Tobacco Use  Smoking Status Never Smoker  Smokeless Tobacco Never Used    Goals Met:  Exercise tolerated well No report of cardiac concerns or symptoms Strength training completed today  Goals Unmet:  Not Applicable  Comments: Service time is from 1330 to 1440    Dr. Rush Farmer is Medical Director for Pulmonary Rehab at [facility].

## 2019-10-15 DIAGNOSIS — G4733 Obstructive sleep apnea (adult) (pediatric): Secondary | ICD-10-CM | POA: Diagnosis not present

## 2019-10-16 ENCOUNTER — Encounter (HOSPITAL_COMMUNITY)
Admission: RE | Admit: 2019-10-16 | Discharge: 2019-10-16 | Disposition: A | Discharge status: Home / Self Care | Payer: PPO | Source: Ambulatory Visit | Attending: Internal Medicine | Admitting: Internal Medicine

## 2019-10-16 ENCOUNTER — Other Ambulatory Visit: Payer: Self-pay

## 2019-10-16 DIAGNOSIS — J84112 Idiopathic pulmonary fibrosis: Secondary | ICD-10-CM

## 2019-10-16 DIAGNOSIS — I472 Ventricular tachycardia: Secondary | ICD-10-CM | POA: Diagnosis not present

## 2019-10-16 NOTE — Progress Notes (Signed)
Daily Session Note  Patient Details  Name: Stephen [name] MRN: [id number] Date of Birth: 06-25-1951 Referring Provider:     Pulmonary Rehab Walk Test from 09/11/2019 in [place]  Referring Provider  Dr. Chase Caller      Encounter Date: 10/16/2019  Check In: Session Check In - 10/16/19 1446      Check-In   Supervising physician immediately available to respond to emergencies  [facility] immediately available    Physician(s)  Dr. Sloan Leiter    Location  [facility]    Staff Present  Rosebud Poles, RN, Bjorn Loser, MS, Exercise Physiologist;Karess Harner Ysidro Evert, RN    Virtual Visit  No    Medication changes reported      No    Fall or balance concerns reported     No    Tobacco Cessation  No Change    Warm-up and Cool-down  Performed on first and last piece of equipment    Resistance Training Performed  Yes    VAD Patient?  No    PAD/SET Patient?  No      Pain Assessment   Currently in Pain?  No/denies    Multiple Pain Sites  No       Capillary Blood Glucose: No results found for this or any previous visit (from the past 24 hour(s)).  Exercise Prescription Changes - 10/16/19 1400      Home Exercise Plan   Plans to continue exercise at  Home (comment)    Frequency  Add 1 additional day to program exercise sessions.    Initial Home Exercises Provided  10/16/19       Social History   Tobacco Use  Smoking Status Never Smoker  Smokeless Tobacco Never Used    Goals Met:  Exercise tolerated well No report of cardiac concerns or symptoms Strength training completed today  Goals Unmet:  Not Applicable  Comments: Service time is from 1325 to [place]    Dr. Rush Farmer is Medical Director for Pulmonary Rehab at [facility].

## 2019-10-16 NOTE — Progress Notes (Signed)
I have reviewed a Home Exercise Prescription with Stephen [name] . Stephen [name] is not currently exercising at home.  The patient was advised to walk 1 days a week for 30 minutes.  Stephen [name] and I discussed how to progress their exercise prescription.  The patient stated that their goals were to get back into his former physical shape.  The patient stated that they understand the exercise prescription.  We reviewed exercise guidelines, target heart rate during exercise, RPE Scale, weather conditions, NTG use, endpoints for exercise, warmup and cool down.  Patient is encouraged to come to me with any questions. I will continue to follow up with the patient to assist them with progression and safety.    Stephen [name], M.S., ACSM CEP

## 2019-10-17 ENCOUNTER — Encounter: Payer: PPO | Admitting: *Deleted

## 2019-10-17 DIAGNOSIS — Z006 Encounter for examination for normal comparison and control in clinical research program: Secondary | ICD-10-CM

## 2019-10-17 DIAGNOSIS — J84112 Idiopathic pulmonary fibrosis: Secondary | ICD-10-CM

## 2019-10-17 NOTE — Research (Signed)
Title: GALACTIC-1 [facility]) is a Phase 2b, randomized, double-blind, parallel, placebo-controlled multicenter international study to evaluate evaluate the efficacy and safety of two doses (placebo v 21m v 174m of TD139 administered once a day for 52 weeks as compared to placebo in subjects with Idiopathic Pulmonary Fibrosis. Key Primary End Point: Annual rate of decline in FVC expressed in mL over 52 weeks  Protocol #: GALACTIC-1, Clinical Trials #: [id number]: [email or web address] [place] Protocol Version V5.8 US_dated [id number] Version 9.0 dated [id number]: Consent Form Addendum (IRB approved Apr 24, 2019)  Main-: IRB version 4.0 dated 25Sep2020; Optional Pharmacogenetic testing: IRB version 2.0 dated Oct 02, 2018   Key Features of TD139 the study drug: a Galectin-3 inhibitor designed specifically to modulate the fibrogenic response to tissue injury. In multiple models of organ fibrosis, it has been demonstrated that Gal-3 is potently pro-fibrotic, modulating the activity of fibroblasts and macrophages in chronically injured tissues. It is hypothesized that increased Gal-3 expression during chronic injury leads to fibroblast activation via the aggregation of growth factor receptor clusters (such as TGF-), thereby amplifying profibrotic signal cascades.  Key Inclusion Criteria:  Age ? 4076 Diagnosis of IPF established during the previous 3 years An historical diagnostic HRCT scan within the 12 months prior to screening. Note: a separate HRCT scan will not be performed as part of the study. Any existing SoC treatment must be deemed as stable by the PI/treating physician before randomization into the study. a. FVC > 45% of the predicted  b. DLCO (corrected for Hb) of 30% to 79% of the predicted value at screening c. FEV1/FVC ?  0.7  Women of childbearing potential agree to use highly effective birth control methods during the study  Key Exclusion Criteria  Has  a history of malignancy within the last 2 years with the exception of basal cell carcinoma, chronic lymphocytic leukaemia and prostate cancer requiring androgens, localised treatment and/or managed by observation. Is likely to receive lung transplantation within the next 12 months Currently receiving investigational therapy for IPF or administration of such therapeutics within 4 weeks of initial screening (or 5 half-lives, whichever is longer) Currently receiving high dose corticosteroids, cytotoxic, and or vasodilator therapy for pulmonary hypertension or administration of such therapeutics within 4 weeks of initial screening (or 5 half-lives, whichever is longer). A current dose of less than or equal to 15 mg/day of prednisone or its equivalent is acceptable if the dose is anticipated to remain stable during the study. Short term use (<1 month) of higher dose corticosteroid treatment is also permitted. Has a history of unstable or deteriorating cardiac or pulmonary disease (other than IPF) within the previous six months Has clinical evidence of active infection   Half-life: 3.5-8 hours For the 3 mg dose, the t1/2 is 3.5 hours. For 10 mg dose, the t1/2 is closer to 8 hours  Interactions No clinically significant CYP interactions  Safety Data Version 3.0. 08 Oct 2017  Animal studies: - No effect on central nervous system - No effect on blood pressure, heart rate or mean arterial pressure - No effect on QTc - No effect on respiratory system - No mention of renal/hepatic effect  Phase I studies: Conducted in 36 healthy patients, 41% reported treatment emergent adverse events (TEAE); all were mild in nature. The most frequently reported TEAE was dysgeusia. This occurred in 36% of patients and was transient. 8% of patients reported cough. 2 patients (5%) were diagnosed with an upper respiratory tract  infection during the study, and  required treatment with antibiotics. 1 patient (2%)  experienced a serious adverse event; pneumonia, this was fatal. Little systemic effect seen thus far.   Informed Consent   Subject Name: Stephen [name]  This patient, Stephen [name], has been consented to the above clinical trial according to FDA regulations, GCP guidelines and [employer]'s SOPs. The informed consent form and study design have been explained to this patient by this study coordinator at 10/17/2019. The patient demonstrated comprehension of this clinical trial and study requirements/expectations. No study procedures have been initiated before consenting of this patient. The patient was given sufficient time for reading the consent form. All risks, benefits and options have been thoroughly discussed and all questions were answered per the patient's satisfaction. This patient was not coerced in any way to participate in this clinical trial. This patient has voluntarily signed consent version as listed above at 1430 on 10/17/2019. A copy of the signed consent form was given to the patient and a copy was placed in the subject's medical record. Subject was thanked for their participation in research and contribution to science.  Subject designated spouse, Stephen [name], as his LAR in the event he is not able to make decisions on his own. refer to the informed consent documentation checklist for further documentation of the consent process.   [place], [place], [place], [place] [facility] [phone]

## 2019-10-19 DIAGNOSIS — J84112 Idiopathic pulmonary fibrosis: Secondary | ICD-10-CM | POA: Diagnosis not present

## 2019-10-20 ENCOUNTER — Other Ambulatory Visit: Payer: Self-pay | Admitting: Internal Medicine

## 2019-10-20 DIAGNOSIS — Z006 Encounter for examination for normal comparison and control in clinical research program: Secondary | ICD-10-CM

## 2019-10-20 DIAGNOSIS — J84112 Idiopathic pulmonary fibrosis: Secondary | ICD-10-CM

## 2019-10-21 ENCOUNTER — Other Ambulatory Visit: Payer: Self-pay

## 2019-10-21 ENCOUNTER — Encounter (HOSPITAL_COMMUNITY)
Admission: RE | Admit: 2019-10-21 | Discharge: 2019-10-21 | Disposition: A | Discharge status: Home / Self Care | Payer: PPO | Source: Ambulatory Visit | Attending: Internal Medicine | Admitting: Internal Medicine

## 2019-10-21 DIAGNOSIS — J84112 Idiopathic pulmonary fibrosis: Secondary | ICD-10-CM

## 2019-10-21 NOTE — Progress Notes (Signed)
Daily Session Note  Patient Details  Name: KYSON KUPPER MRN: [id number] Date of Birth: 09/30/1951 Referring Provider:     Pulmonary Rehab Walk Test from 09/11/2019 in [place]  Referring Provider  Dr. Chase Caller      Encounter Date: 10/21/2019  Check In: Session Check In - 10/21/19 1407      Check-In   Supervising physician immediately available to respond to emergencies  [facility] immediately available    Physician(s)  Dr. [place] Crane    Location  [facility]    Staff Present  Rosebud Poles, RN, Bjorn Loser, MS, Exercise Physiologist;Lisa Ysidro Evert, RN    Virtual Visit  No    Medication changes reported      No    Fall or balance concerns reported     No    Tobacco Cessation  No Change    Warm-up and Cool-down  Performed on first and last piece of equipment    Resistance Training Performed  Yes    VAD Patient?  No    PAD/SET Patient?  No      Pain Assessment   Currently in Pain?  No/denies    Multiple Pain Sites  No       Capillary Blood Glucose: No results found for this or any previous visit (from the past 24 hour(s)).    Social History   Tobacco Use  Smoking Status Never Smoker  Smokeless Tobacco Never Used    Goals Met:  Independence with exercise equipment Exercise tolerated well Strength training completed today  Goals Unmet:  Not Applicable  Comments: Service time is from 1320 to 1436    Dr. Rush Farmer is Medical Director for Pulmonary Rehab at [facility].

## 2019-10-22 ENCOUNTER — Other Ambulatory Visit: Payer: Self-pay | Admitting: Internal Medicine

## 2019-10-22 DIAGNOSIS — Z006 Encounter for examination for normal comparison and control in clinical research program: Secondary | ICD-10-CM

## 2019-10-22 DIAGNOSIS — J84112 Idiopathic pulmonary fibrosis: Secondary | ICD-10-CM

## 2019-10-23 ENCOUNTER — Encounter (HOSPITAL_COMMUNITY)
Admission: RE | Admit: 2019-10-23 | Discharge: 2019-10-23 | Disposition: A | Discharge status: Home / Self Care | Payer: PPO | Source: Ambulatory Visit | Attending: Internal Medicine | Admitting: Internal Medicine

## 2019-10-23 ENCOUNTER — Other Ambulatory Visit: Payer: Self-pay

## 2019-10-23 DIAGNOSIS — I472 Ventricular tachycardia: Secondary | ICD-10-CM | POA: Diagnosis not present

## 2019-10-23 DIAGNOSIS — J84112 Idiopathic pulmonary fibrosis: Secondary | ICD-10-CM | POA: Diagnosis not present

## 2019-10-23 NOTE — Progress Notes (Signed)
Daily Session Note  Patient Details  Name: CHAOS CARLILE MRN: [id number] Date of Birth: [birth year] Referring Provider:     Pulmonary Rehab Walk Test from 09/11/2019 in [place]  Referring Provider  Dr. Chase Caller      Encounter Date: 10/23/2019  Check In: Session Check In - 10/23/19 1030      Check-In   Supervising physician immediately available to respond to emergencies  [facility] immediately available    Physician(s)  Dr. Avon Gully    Location  [facility]    Staff Present  Rosebud Poles, RN, Bjorn Loser, MS, Exercise Physiologist;Toretto Tingler Ysidro Evert, RN    Virtual Visit  No    Medication changes reported      No    Fall or balance concerns reported     No    Tobacco Cessation  No Change    Warm-up and Cool-down  Performed on first and last piece of equipment    Resistance Training Performed  Yes    VAD Patient?  No    PAD/SET Patient?  No      Pain Assessment   Currently in Pain?  No/denies    Multiple Pain Sites  No       Capillary Blood Glucose: No results found for this or any previous visit (from the past 24 hour(s)).    Social History   Tobacco Use  Smoking Status Never Smoker  Smokeless Tobacco Never Used    Goals Met:  Exercise tolerated well No report of cardiac concerns or symptoms Strength training completed today  Goals Unmet:  Not Applicable  Comments: Service time is from 1045 to 1145    Dr. Rush Farmer is Medical Director for Pulmonary Rehab at [facility].

## 2019-10-28 ENCOUNTER — Encounter (HOSPITAL_COMMUNITY)
Admission: RE | Admit: 2019-10-28 | Discharge: 2019-10-28 | Disposition: A | Discharge status: Home / Self Care | Payer: PPO | Source: Ambulatory Visit | Attending: Internal Medicine | Admitting: Internal Medicine

## 2019-10-28 ENCOUNTER — Other Ambulatory Visit: Payer: Self-pay

## 2019-10-28 VITALS — Wt 249.3 lb

## 2019-10-28 DIAGNOSIS — J84112 Idiopathic pulmonary fibrosis: Secondary | ICD-10-CM

## 2019-10-28 NOTE — Progress Notes (Signed)
Daily Session Note  Patient Details  Name: Stephen [name] MRN: [id number] Date of Birth: 04-25-1951 Referring Provider:     Pulmonary Rehab Walk Test from 09/11/2019 in [place]  Referring Provider  Dr. Chase Caller      Encounter Date: 10/28/2019  Check In: Session Check In - 10/28/19 1512      Check-In   Supervising physician immediately available to respond to emergencies  [facility] immediately available    Physician(s)  Dr. Nevada Crane    Location  [facility]    Staff Present  Rosebud Poles, RN, Bjorn Loser, MS, Exercise Physiologist;Avyn Coate Ysidro Evert, RN    Virtual Visit  No    Medication changes reported      No    Fall or balance concerns reported     No    Tobacco Cessation  No Change    Warm-up and Cool-down  Performed on first and last piece of equipment    Resistance Training Performed  Yes    VAD Patient?  No    PAD/SET Patient?  No      Pain Assessment   Currently in Pain?  No/denies    Multiple Pain Sites  No       Capillary Blood Glucose: No results found for this or any previous visit (from the past 24 hour(s)).  Exercise Prescription Changes - 10/28/19 1500      Response to Exercise   Blood Pressure (Admit)  140/68    Blood Pressure (Exercise)  148/72    Blood Pressure (Exit)  124/72    Heart Rate (Admit)  79 bpm    Heart Rate (Exercise)  85 bpm    Heart Rate (Exit)  76 bpm    Oxygen Saturation (Admit)  95 %    Oxygen Saturation (Exercise)  96 %    Oxygen Saturation (Exit)  95 %    Rating of Perceived Exertion (Exercise)  13    Perceived Dyspnea (Exercise)  1    Duration  Continue with 30 min of aerobic exercise without signs/symptoms of physical distress.    Intensity  THRR unchanged      Progression   Progression  Continue to progress workloads to maintain intensity without signs/symptoms of physical distress.      Resistance Training   Training Prescription  Yes    Weight  blue bands    Reps  10-15    Time  10 Minutes      Oxygen   Oxygen  Continuous    Liters  2      NuStep   Level  4    SPM  80    Minutes  15    METs  1.7      Arm Ergometer   Level  2.5    Minutes  15       Social History   Tobacco Use  Smoking Status Never Smoker  Smokeless Tobacco Never Used    Goals Met:  Exercise tolerated well No report of cardiac concerns or symptoms Strength training completed today  Goals Unmet:  Not Applicable  Comments: Service time is from 1330 to 1445    Dr. Rush Farmer is Medical Director for Pulmonary Rehab at [facility].

## 2019-10-30 ENCOUNTER — Encounter (HOSPITAL_COMMUNITY)
Admission: RE | Admit: 2019-10-30 | Discharge: 2019-10-30 | Disposition: A | Discharge status: Home / Self Care | Payer: PPO | Source: Ambulatory Visit | Attending: Internal Medicine | Admitting: Internal Medicine

## 2019-10-30 ENCOUNTER — Other Ambulatory Visit: Payer: Self-pay

## 2019-10-30 ENCOUNTER — Ambulatory Visit (INDEPENDENT_AMBULATORY_CARE_PROVIDER_SITE_OTHER)
Admission: RE | Admit: 2019-10-30 | Discharge: 2019-10-30 | Disposition: A | Discharge status: Home / Self Care | Payer: Self-pay | Source: Ambulatory Visit | Attending: Internal Medicine | Admitting: Internal Medicine

## 2019-10-30 DIAGNOSIS — E785 Hyperlipidemia, unspecified: Secondary | ICD-10-CM | POA: Diagnosis not present

## 2019-10-30 DIAGNOSIS — J84112 Idiopathic pulmonary fibrosis: Secondary | ICD-10-CM | POA: Diagnosis not present

## 2019-10-30 DIAGNOSIS — E119 Type 2 diabetes mellitus without complications: Secondary | ICD-10-CM | POA: Diagnosis not present

## 2019-10-30 DIAGNOSIS — Z006 Encounter for examination for normal comparison and control in clinical research program: Secondary | ICD-10-CM

## 2019-10-30 DIAGNOSIS — I10 Essential (primary) hypertension: Secondary | ICD-10-CM | POA: Diagnosis not present

## 2019-10-30 NOTE — Progress Notes (Signed)
Daily Session Note  Patient Details  Name: Stephen [name] MRN: [id number] Date of Birth: 1950/12/21 Referring Provider:     Pulmonary Rehab Walk Test from 09/11/2019 in [place]  Referring Provider  Dr. Chase Caller      Encounter Date: 10/30/2019  Check In: Session Check In - 10/30/19 1419      Check-In   Supervising physician immediately available to respond to emergencies  [facility] immediately available    Physician(s)  Dr. Nevada Crane    Location  [facility]    Staff Present  Rosebud Poles, RN, Bjorn Loser, MS, Exercise Physiologist;Lisa Ysidro Evert, RN    Virtual Visit  No    Medication changes reported      No    Fall or balance concerns reported     No    Tobacco Cessation  No Change    Warm-up and Cool-down  Performed on first and last piece of equipment    Resistance Training Performed  Yes    VAD Patient?  No    PAD/SET Patient?  No      Pain Assessment   Currently in Pain?  No/denies    Multiple Pain Sites  No       Capillary Blood Glucose: No results found for this or any previous visit (from the past 24 hour(s)).    Social History   Tobacco Use  Smoking Status Never Smoker  Smokeless Tobacco Never Used    Goals Met:  Exercise tolerated well No report of cardiac concerns or symptoms Strength training completed today  Goals Unmet:  Not Applicable  Comments: Service time is from 1310 to 1432    Dr. Rush Farmer is Medical Director for Pulmonary Rehab at [facility].

## 2019-11-04 ENCOUNTER — Encounter (HOSPITAL_COMMUNITY): Payer: PPO

## 2019-11-06 ENCOUNTER — Encounter (HOSPITAL_COMMUNITY): Payer: PPO

## 2019-11-11 ENCOUNTER — Encounter (HOSPITAL_COMMUNITY): Payer: PPO

## 2019-11-13 ENCOUNTER — Encounter (HOSPITAL_COMMUNITY): Payer: PPO

## 2019-11-13 ENCOUNTER — Telehealth (HOSPITAL_COMMUNITY): Payer: Self-pay | Admitting: *Deleted

## 2019-11-13 NOTE — Telephone Encounter (Signed)
Called to check on patient since we have suspended patients from exercising in person in pulmonary rehab.  He was not able to talk to me d/t having a discussion with and insurance agent.  Will call patient back Monday 11/17/19.

## 2019-11-17 ENCOUNTER — Encounter (HOSPITAL_COMMUNITY)
Admission: RE | Admit: 2019-11-17 | Discharge: 2019-11-17 | Disposition: A | Discharge status: Home / Self Care | Payer: PPO | Source: Ambulatory Visit | Attending: Internal Medicine | Admitting: Internal Medicine

## 2019-11-17 ENCOUNTER — Other Ambulatory Visit: Payer: Self-pay

## 2019-11-17 DIAGNOSIS — J84112 Idiopathic pulmonary fibrosis: Secondary | ICD-10-CM | POA: Insufficient documentation

## 2019-11-17 NOTE — Progress Notes (Addendum)
Called to check on patient via phone call to see if he is exercising on a regular basis.  He states he is not, strongly encouraged to walk at home 3 minutes 3 times/day and then increase to 4 minutes 3 times per day, etc.  Patient states he will "try".  Will follow up in a week.  His wife was on the call as well and will encourage.

## 2019-11-19 DIAGNOSIS — J84112 Idiopathic pulmonary fibrosis: Secondary | ICD-10-CM | POA: Diagnosis not present

## 2019-11-21 ENCOUNTER — Other Ambulatory Visit: Payer: Self-pay | Admitting: Internal Medicine

## 2019-11-21 ENCOUNTER — Encounter: Payer: Self-pay | Admitting: Adult Health

## 2019-11-21 ENCOUNTER — Other Ambulatory Visit: Payer: Self-pay

## 2019-11-21 ENCOUNTER — Encounter (INDEPENDENT_AMBULATORY_CARE_PROVIDER_SITE_OTHER): Payer: PPO | Admitting: Adult Health

## 2019-11-21 ENCOUNTER — Encounter: Payer: PPO | Admitting: *Deleted

## 2019-11-21 VITALS — BP 132/34 | HR 59 | Temp 98.4°F | Resp 19 | Ht 66.34 in | Wt 243.2 lb

## 2019-11-21 DIAGNOSIS — Z006 Encounter for examination for normal comparison and control in clinical research program: Secondary | ICD-10-CM

## 2019-11-21 DIAGNOSIS — J84112 Idiopathic pulmonary fibrosis: Secondary | ICD-10-CM

## 2019-11-21 HISTORY — DX: Encounter for examination for normal comparison and control in clinical research program: Z00.6

## 2019-11-21 NOTE — Telephone Encounter (Signed)
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   Plan - tussionex 48m Bid prn     SIGNATURE    Dr. MBrand Males M.D., F.C.C.P,  Pulmonary and Critical Care Medicine Staff Physician, [place]Director - Interstitial Lung Disease  Program  [place] [place] [id number] Pager: [phone] If no answer or between  15:00h - 7:00h: call [phone] Telephone: [phone]  3:45 PM 11/21/2019

## 2019-11-21 NOTE — Patient Instructions (Signed)
Continue research protcol

## 2019-11-21 NOTE — Progress Notes (Signed)
_0  ID: Stephen [name], male    DOB: Jul 28, 1951, 69 y.o.   MRN: [id number]  Chief Complaint  Patient presents with  . Follow-up    Research Study     Referring provider: Ronita Hipps, MD  HPI: Title: [facility] ([place]) is a Phase 2b, randomized, double-blind, parallel, placebo-controlled multicenter international study to evaluate evaluate the efficacy and safety of two doses (placebo v 58m v 128m of TD139 administered once a day for 52 weeks as compared to placebo in subjects with Idiopathic Pulmonary Fibrosis. Key Primary End Point: Annual rate of decline in FVC expressed in mL over 52 weeks  Protocol #: GALACTIC-1, Clinical Trials #: [id number]: [email or web address] [place] Protocol Version V5.8 US_dated [id number] Version 9.0 dated [id number]: Consent Form Addendum (IRB approved Apr 24, 2019)  Main-: IRB version 4.0 dated 25Sep2020; Optional Pharmacogenetic testing: IRB version 2.0 dated Oct 02, 2018   Key Features of TD139 the study drug: a Galectin-3 inhibitor designed specifically to modulate the fibrogenic response to tissue injury. In multiple models of organ fibrosis, it has been demonstrated that Gal-3 is potently pro-fibrotic, modulating the activity of fibroblasts and macrophages in chronically injured tissues. It is hypothesized that increased Gal-3 expression during chronic injury leads to fibroblast activation via the aggregation of growth factor receptor clusters (such as TGF-), thereby amplifying profibrotic signal cascades.  Key Inclusion Criteria:  Age ? 40107 Diagnosis of IPF established during the previous 3 years An historical diagnostic HRCT scan within the 12 months prior to screening. Note: a separate HRCT scan will not be performed as part of the study. Any existing SoC treatment must be deemed as stable by the PI/treating physician before randomization into the study. a. FVC > 45% of the predicted  b. DLCO  (corrected for Hb) of 30% to 79% of the predicted value at screening c. FEV1/FVC ?  0.7  Women of childbearing potential agree to use highly effective birth control methods during the study  Key Exclusion Criteria  Has a history of malignancy within the last 2 years with the exception of basal cell carcinoma, chronic lymphocytic leukaemia and prostate cancer requiring androgens, localised treatment and/or managed by observation. Is likely to receive lung transplantation within the next 12 months Currently receiving investigational therapy for IPF or administration of such therapeutics within 4 weeks of initial screening (or 5 half-lives, whichever is longer) Currently receiving high dose corticosteroids, cytotoxic, and or vasodilator therapy for pulmonary hypertension or administration of such therapeutics within 4 weeks of initial screening (or 5 half-lives, whichever is longer). A current dose of less than or equal to 15 mg/day of prednisone or its equivalent is acceptable if the dose is anticipated to remain stable during the study. Short term use (<1 month) of higher dose corticosteroid treatment is also permitted. Has a history of unstable or deteriorating cardiac or pulmonary disease (other than IPF) within the previous six months Has clinical evidence of active infection   Half-life: 3.5-8 hours For the 3 mg dose, the t1/2 is 3.5 hours. For 10 mg dose, the t1/2 is closer to 8 hours  Interactions No clinically significant CYP interactions  Safety Data Version 3.0. 08 Oct 2017  Animal studies: - No effect on central nervous system - No effect on blood pressure, heart rate or mean arterial pressure - No effect on QTc - No effect on respiratory system - No mention of renal/hepatic effect  Phase I studies: Conducted in 3628  healthy patients, 41% reported treatment emergent adverse events (TEAE); all were mild in nature. The most frequently reported TEAE was dysgeusia. This  occurred in 36% of patients and was transient. 8% of patients reported cough. 2 patients (5%) were diagnosed with an upper respiratory tract infection during the study, and  required treatment with antibiotics. 1 patient (2%) experienced a serious adverse event; pneumonia, this was fatal. Little systemic effect seen thus far.   11/21/2019 Research Visit: Screening Visit  The above subject presents for consented research study as above for screening visit. Patient has no questions . Screening visit physical exam completed per protocol.  Says breathing is doing the same except had a fall on 11/18/19 with abrasion to right hand. Has chronic balance issues from accident from 2002.  Subject was thanked for their participation in research and contribution to science.         Past Medical History:  Diagnosis Date  . Allergy   . Arthritis   . GERD (gastroesophageal reflux disease)   . Heart murmur   . Hiatal hernia   . Hyperlipidemia   . Hypertension   . Sleep apnea   . Vertigo     Tobacco History: Social History   Tobacco Use  Smoking Status Never Smoker  Smokeless Tobacco Never Used   Counseling given: Not Answered   Outpatient Medications Prior to Visit  Medication Sig Dispense Refill  . amitriptyline (ELAVIL) 75 MG tablet Take 75 mg by mouth at bedtime.    Marland Kitchen amLODipine (NORVASC) 10 MG tablet Take 10 mg by mouth daily.    Marland Kitchen aspirin EC 81 MG tablet Take 81 mg by mouth daily.    . calcium carbonate (OSCAL) 1500 (600 Ca) MG TABS tablet Take by mouth 2 (two) times daily with a meal.    . calcium carbonate (TUMS - DOSED IN MG ELEMENTAL CALCIUM) 500 MG chewable tablet Chew 1 tablet by mouth daily.    . cholecalciferol (VITAMIN D) 1000 units tablet Take 1,000 Units by mouth daily.    . cloNIDine (CATAPRES) 0.3 MG tablet Take 0.3 mg by mouth daily after supper.     . dicyclomine (BENTYL) 10 MG capsule Take 10 mg by mouth 3 (three) times daily before meals.     . lansoprazole  (PREVACID) 30 MG capsule Take 30 mg by mouth daily at 12 noon.    . Methylcellulose, Laxative, (CITRUCEL PO) Take 1 Scoop by mouth.    . metoprolol tartrate (LOPRESSOR) 50 MG tablet Take 1 tablet by mouth 2 (two) times daily.    . montelukast (SINGULAIR) 10 MG tablet Take 10 mg by mouth at bedtime.    . Multiple Vitamin (MULTIVITAMIN WITH MINERALS) TABS tablet Take 1 tablet by mouth daily.    . nabumetone (RELAFEN) 750 MG tablet Take 750 mg by mouth 2 (two) times daily.    Marland Kitchen olmesartan (BENICAR) 20 MG tablet Take 1 tablet by mouth daily.    . Pirfenidone (ESBRIET) 267 MG CAPS Take 801 mg by mouth 3 (three) times daily. Takes 3 caps three times daily     . Pirfenidone (ESBRIET) 801 MG TABS Take 1 tablet by mouth 3 (three) times daily. 270 tablet 0  . polyethylene glycol (MIRALAX / GLYCOLAX) packet Take 17 g by mouth daily.    . Probiotic Product (PROBIOTIC DAILY PO) Take 1 capsule by mouth daily.    . rosuvastatin (CRESTOR) 10 MG tablet Take 10 mg by mouth daily.    . sodium chloride 1  g tablet Take 3 g by mouth 3 (three) times daily.     Marland Kitchen spironolactone (ALDACTONE) 25 MG tablet Take 25 mg by mouth 3 (three) times a week.    Marland Kitchen UNABLE TO FIND Take 1 capsule by mouth 3 (three) times daily. Med Name: blood sugar harmony    . UNABLE TO FIND Take 1 capsule by mouth 3 (three) times daily. Med Name: Clear lungs    . vitamin B-12 (CYANOCOBALAMIN) 1000 MCG tablet Take 1,000 mcg by mouth daily.    . vitamin E 400 UNIT capsule Take 400 Units by mouth daily.     No facility-administered medications prior to visit.    Physical Exam See Research PE    Lab Results:  CBC  BMET   BNP No results found for: BNP  ProBNP No results found for: PROBNP  Imaging: CT Chest High Resolution  Result Date: 10/30/2019 CLINICAL DATA:  69 year old male with history of idiopathic pulmonary fibrosis. EXAM: CT CHEST WITHOUT CONTRAST TECHNIQUE: Multidetector CT imaging of the chest was performed following the  standard protocol without intravenous contrast. High resolution imaging of the lungs, as well as inspiratory and expiratory imaging, was performed. COMPARISON:  High-resolution chest CT 06/27/2019. FINDINGS: Cardiovascular: Heart size is normal. There is no significant pericardial fluid, thickening or pericardial calcification. There is aortic atherosclerosis, as well as atherosclerosis of the great vessels of the mediastinum and the coronary arteries, including calcified atherosclerotic plaque in the left main, left anterior descending, left circumflex and right coronary arteries. Mediastinum/Nodes: Multiple borderline enlarged and mildly enlarged mediastinal and bilateral hilar lymph nodes, similar to prior studies, measuring up to 1.7 cm in the low right paratracheal nodal station, presumably chronic and reactive in the setting of interstitial lung disease. Esophagus is unremarkable in appearance. No axillary lymphadenopathy. Lungs/Pleura: High-resolution images demonstrate widespread areas of septal thickening, subpleural reticulation, thickening of the peribronchovascular interstitium, traction bronchiectasis, peripheral bronchiolectasis and honeycombing. Findings are mildly progressive compared to prior examinations dating back to 2019 and have a definitive craniocaudal gradient. Inspiratory and expiratory imaging is unremarkable. No acute consolidative airspace disease. No pleural effusions. No definite suspicious appearing pulmonary nodules or masses are noted. Upper Abdomen: Unremarkable. Musculoskeletal: There are no aggressive appearing lytic or blastic lesions noted in the visualized portions of the skeleton. IMPRESSION: 1. The appearance of the lungs is compatible with interstitial lung disease, with a spectrum of findings considered diagnostic of usual interstitial pneumonia (UIP) per current ATS guidelines. Relatively stable appearance compared to the most prior exam from 06/27/2019, but slow  progression noted compared to more remote prior studies from 01/21/2018. 2. Aortic atherosclerosis, in addition to left main and 3 vessel coronary artery disease. Please note that although the presence of coronary artery calcium documents the presence of coronary artery disease, the severity of this disease and any potential stenosis cannot be assessed on this non-gated CT examination. Assessment for potential risk factor modification, dietary therapy or pharmacologic therapy may be warranted, if clinically indicated. Aortic Atherosclerosis (ICD10-I70.0). Electronically Signed   By: Vinnie Langton M.D.   On: 10/30/2019 14:27   LONG TERM MONITOR (3-14 DAYS)  Result Date: 11/07/2019 AMYR SLUDER, DOB [birth year], MRN [id number] EVENT MONITOR REPORT: Patient was monitored from 10/02/2019 to 10/16/2019. Indication:                    Bradycardia Ordering physician:  Jenean Lindau, MD Referring physician:  Jenean Lindau, MD Baseline rhythm: Sinus Minimum heart rate: 42 BPM.  Average heart rate: 57 BPM.  Maximal heart rate 122 BPM. Atrial arrhythmia: Multiple brief atrial runs longest lasting 14 seconds with average heart rate of 108 Ventricular arrhythmia: None significant Conduction abnormality: None significant Symptoms: None Conclusion: Mildly abnormal event monitor with brief atrial runs as mentioned above. Interpreting  cardiologist: Jenean Lindau, MD Date: 11/07/2019 3:58 PM     PFT Results Latest Ref Rng & Units 08/27/2019 10/03/2018 08/16/2018 04/11/2018  FVC-Pre L 2.18 2.42 2.53 2.57  FVC-Predicted Pre % 56 65 67 63  Pre FEV1/FVC % % 82 91 89 89  FEV1-Pre L 1.78 2.22 2.26 2.29  FEV1-Predicted Pre % 62 80 81 76  DLCO UNC% % 59 - - 58  DLCO COR %Predicted % 97 - - 106    No results found for: NITRICOXIDE      Assessment & Plan:   Idiopathic pulmonary fibrosis (HCC) Continue on current regimen .    Research study patient Screening visit protocol completed , Exam completed.        Rexene Edison, NP 11/21/2019

## 2019-11-21 NOTE — Research (Signed)
Title: GALACTIC-1 [facility]) is a Phase 2b, randomized, double-blind, parallel, placebo-controlled multicenter international study to evaluate evaluate the efficacy and safety of two doses (placebo v 73m v 171m of TD139 administered once a day for 52 weeks as compared to placebo in subjects with Idiopathic Pulmonary Fibrosis. Key Primary End Point: Annual rate of decline in FVC expressed in mL over 52 weeks  Protocol #: GALACTIC-1, Clinical Trials #: [id number]: [email or web address] [place] Key Features of TD139 the study drug: a Galectin-3 inhibitor designed specifically to modulate the fibrogenic response to tissue injury. In multiple models of organ fibrosis, it has been demonstrated that Gal-3 is potently pro-fibrotic, modulating the activity of fibroblasts and macrophages in chronically injured tissues. It is hypothesized that increased Gal-3 expression during chronic injury leads to fibroblast activation via the aggregation of growth factor receptor clusters (such as TGF-), thereby amplifying profibrotic signal cascades.  Key Inclusion Criteria:  Age ? 4031 Diagnosis of IPF established during the previous 3 years An historical diagnostic HRCT scan within the 12 months prior to screening. Note: a separate HRCT scan will not be performed as part of the study. Any existing SoC treatment must be deemed as stable by the PI/treating physician before randomization into the study. a. FVC > 45% of the predicted  b. DLCO (corrected for Hb) of 30% to 79% of the predicted value at screening c. FEV1/FVC ?  0.7  Women of childbearing potential agree to use highly effective birth control methods during the study  Key Exclusion Criteria  Has a history of malignancy within the last 2 years with the exception of basal cell carcinoma, chronic lymphocytic leukaemia and prostate cancer requiring androgens, localised treatment and/or managed by observation. Is likely to receive lung  transplantation within the next 12 months Currently receiving investigational therapy for IPF or administration of such therapeutics within 4 weeks of initial screening (or 5 half-lives, whichever is longer) Currently receiving high dose corticosteroids, cytotoxic, and or vasodilator therapy for pulmonary hypertension or administration of such therapeutics within 4 weeks of initial screening (or 5 half-lives, whichever is longer). A current dose of less than or equal to 15 mg/day of prednisone or its equivalent is acceptable if the dose is anticipated to remain stable during the study. Short term use (<1 month) of higher dose corticosteroid treatment is also permitted. Has a history of unstable or deteriorating cardiac or pulmonary disease (other than IPF) within the previous six months Has clinical evidence of active infection   Half-life: 3.5-8 hours For the 3 mg dose, the t1/2 is 3.5 hours. For 10 mg dose, the t1/2 is closer to 8 hours  Interactions No clinically significant CYP interactions  Safety Data Version 3.0. 08 Oct 2017  Animal studies: - No effect on central nervous system - No effect on blood pressure, heart rate or mean arterial pressure - No effect on QTc - No effect on respiratory system - No mention of renal/hepatic effect  Phase I studies: Conducted in 36 healthy patients, 41% reported treatment emergent adverse events (TEAE); all were mild in nature. The most frequently reported TEAE was dysgeusia. This occurred in 36% of patients and was transient. 8% of patients reported cough. 2 patients (5%) were diagnosed with an upper respiratory tract infection during the study, and  required treatment with antibiotics. 1 patient (2%) experienced a serious adverse event; pneumonia, this was fatal. Little systemic effect seen thus far.   Clinical Research Coordinator / Research RN note : This visit  for Subject Stephen [name] with DOB: 05-05-1951 on 11/21/2019 for the above protocol  is Visit/Encounter Visit 1/screening  and is for purpose of research. Subject, Stephen [name], expressed continued interest and consent in continuing as a study subject. Subject confirmed that there was  no change in contact information (e.g. address, telephone, email). Subject thanked for participation in research and contribution to science.   In this visit 11/21/2019 the subject will be evaluated by sub-investigator named Tammy Parrett. This research coordinator has verified that the investigator is up to date with her training logs  Additional details  Because the PI is NOT available due to schedule issues, the sub-I reported and CRC has confirmed that the PI is aware of the patient visit with the sub-investigator  The CRC has confirmed that the note will be routed to PI by sub-I if possible   All procedures needed for Visit 1/screening were completed per the above stated protocol. Additional details are available in the subject binder.  Subject stated that prior to his visit today he suffered from a fall. The fall took place at his home on 18 Nov 2019. Subject did was not hospitalized, minimal injuries reported, bruising on right hand that appears to be healing. Potential AE, will discuss with PI.  Signed by  Barrie Folk Clinical Research Coordinator [facility]  [place], [place] 1:39 PM 11/21/2019

## 2019-11-21 NOTE — Assessment & Plan Note (Signed)
Screening visit protocol completed , Exam completed.

## 2019-11-21 NOTE — Assessment & Plan Note (Signed)
Continue on current regimen .

## 2019-11-24 ENCOUNTER — Telehealth: Payer: Self-pay | Admitting: Internal Medicine

## 2019-11-24 NOTE — Telephone Encounter (Signed)
MR, this is a prescription that you will have to send to the pharmacy. I have pended the Rx so you can take care of the rest with having the Rx sent in for pt.

## 2019-11-24 NOTE — Telephone Encounter (Signed)
Stephen [name] is having severe cough. This was noticed during prior PFT even back in 2019. Is getting worse now. At research he could not do proper PFT. Overall cough is worse  Plan  - please send him tussionex 85m twice daily as needed -   Let me knowwhen done - I can e-sign and finger print when in office 11/25/19

## 2019-11-25 MED ORDER — HYDROCOD POLST-CPM POLST ER 10-8 MG/5ML PO SUER: 5.0000 mL | Freq: Two times a day (BID) | ORAL | 0 refills | Status: DC | PRN

## 2019-11-25 NOTE — Telephone Encounter (Signed)
Called and spoke with pt letting him know that the rx for tussionex was sent in for him. Pt verbalized understanding. Nothing further needed.

## 2019-11-26 ENCOUNTER — Telehealth: Payer: Self-pay | Admitting: Internal Medicine

## 2019-11-26 NOTE — Telephone Encounter (Signed)
Called [facility] and spoke with Oley Balm is not covered and will cost $100 for a 2 wk supply  Pt can not afford and wants alternative  Please advise thanks

## 2019-11-27 MED ORDER — HYDROCODONE-HOMATROPINE 5-1.5 MG/5ML PO SYRP: 5.0000 mL | ORAL_SOLUTION | Freq: Three times a day (TID) | ORAL | 0 refills | Status: DC | PRN

## 2019-11-27 NOTE — Telephone Encounter (Signed)
How many mL's do you want to send?

## 2019-11-27 NOTE — Telephone Encounter (Signed)
Change to HYCODAN  Oral: Hydrocodone 5 mg/homatropine 1.5 mg ( 5 mL) - 3 times daily as needed  Send message back so I can e-sign or have an app co-sign . I am doing [facility]    SIGNATURE    Dr. Brand Males, M.D., F.C.C.P,  Pulmonary and Critical Care Medicine Staff Physician, [place] Director - Interstitial Lung Disease  Program  [facility] [place] [facility] [place], [place], [address]  Pager: [phone], If no answer or between  15:00h - 7:00h: call [phone] Telephone: [phone]  7:31 AM 11/27/2019     Current Outpatient Medications:  .  amitriptyline (ELAVIL) 75 MG tablet, Take 75 mg by mouth at bedtime., Disp: , Rfl:  .  amLODipine (NORVASC) 10 MG tablet, Take 10 mg by mouth daily., Disp: , Rfl:  .  aspirin EC 81 MG tablet, Take 81 mg by mouth daily., Disp: , Rfl:  .  calcium carbonate (OSCAL) 1500 (600 Ca) MG TABS tablet, Take by mouth 2 (two) times daily with a meal., Disp: , Rfl:  .  calcium carbonate (TUMS - DOSED IN MG ELEMENTAL CALCIUM) 500 MG chewable tablet, Chew 1 tablet by mouth daily., Disp: , Rfl:  .  chlorpheniramine-HYDROcodone (TUSSIONEX PENNKINETIC ER) 10-8 MG/5ML SUER, Take 5 mLs by mouth 2 (two) times daily as needed for cough., Disp: 140 mL, Rfl: 0 .  cholecalciferol (VITAMIN D) 1000 units tablet, Take 1,000 Units by mouth daily., Disp: , Rfl:  .  cloNIDine (CATAPRES) 0.3 MG tablet, Take 0.3 mg by mouth daily after supper. , Disp: , Rfl:  .  dicyclomine (BENTYL) 10 MG capsule, Take 10 mg by mouth 3 (three) times daily before meals. , Disp: , Rfl:  .  lansoprazole (PREVACID) 30 MG capsule, Take 30 mg by mouth daily at 12 noon., Disp: , Rfl:  .  Methylcellulose, Laxative, (CITRUCEL PO), Take 1 Scoop by mouth., Disp: , Rfl:  .  metoprolol tartrate (LOPRESSOR) 50 MG tablet, Take 1 tablet by mouth 2 (two) times daily., Disp: , Rfl:  .  montelukast (SINGULAIR) 10 MG tablet,  Take 10 mg by mouth at bedtime., Disp: , Rfl:  .  Multiple Vitamin (MULTIVITAMIN WITH MINERALS) TABS tablet, Take 1 tablet by mouth daily., Disp: , Rfl:  .  nabumetone (RELAFEN) 750 MG tablet, Take 750 mg by mouth 2 (two) times daily., Disp: , Rfl:  .  olmesartan (BENICAR) 20 MG tablet, Take 1 tablet by mouth daily., Disp: , Rfl:  .  Pirfenidone (ESBRIET) 267 MG CAPS, Take 801 mg by mouth 3 (three) times daily. Takes 3 caps three times daily , Disp: , Rfl:  .  Pirfenidone (ESBRIET) 801 MG TABS, Take 1 tablet by mouth 3 (three) times daily., Disp: 270 tablet, Rfl: 0 .  polyethylene glycol (MIRALAX / GLYCOLAX) packet, Take 17 g by mouth daily., Disp: , Rfl:  .  Probiotic Product (PROBIOTIC DAILY PO), Take 1 capsule by mouth daily., Disp: , Rfl:  .  rosuvastatin (CRESTOR) 10 MG tablet, Take 10 mg by mouth daily., Disp: , Rfl:  .  sodium chloride 1 g tablet, Take 3 g by mouth 3 (three) times daily. , Disp: , Rfl:  .  spironolactone (ALDACTONE) 25 MG tablet, Take 25 mg by mouth 3 (three) times a week., Disp: , Rfl:  .  UNABLE TO FIND, Take 1 capsule by mouth 3 (three) times daily. Med Name:  blood sugar harmony, Disp: , Rfl:  .  UNABLE TO FIND, Take 1 capsule by mouth 3 (three) times daily. Med Name: Clear lungs, Disp: , Rfl:  .  vitamin B-12 (CYANOCOBALAMIN) 1000 MCG tablet, Take 1,000 mcg by mouth daily., Disp: , Rfl:  .  vitamin E 400 UNIT capsule, Take 400 Units by mouth daily., Disp: , Rfl:

## 2019-11-27 NOTE — Telephone Encounter (Signed)
Signed. Let  Him [name] know

## 2019-11-27 NOTE — Telephone Encounter (Signed)
I have pended the hycodan Rx for it to be sent to pharmacy. Routing back to MR

## 2019-11-27 NOTE — Telephone Encounter (Signed)
54m x 3times per day as needd x 30 days => 4563min a bottle

## 2019-11-27 NOTE — Telephone Encounter (Signed)
Attempted to call pt to let him know rx was sent in but unable to reach. Left pt a detailed message in regards to letting him know the rx was sent. Nothing further needed.

## 2019-11-29 DIAGNOSIS — J84112 Idiopathic pulmonary fibrosis: Secondary | ICD-10-CM | POA: Diagnosis not present

## 2019-11-29 DIAGNOSIS — I10 Essential (primary) hypertension: Secondary | ICD-10-CM | POA: Diagnosis not present

## 2019-11-29 DIAGNOSIS — E7800 Pure hypercholesterolemia, unspecified: Secondary | ICD-10-CM | POA: Diagnosis not present

## 2019-12-01 ENCOUNTER — Encounter (HOSPITAL_COMMUNITY)
Admission: RE | Admit: 2019-12-01 | Discharge: 2019-12-01 | Disposition: A | Discharge status: Home / Self Care | Payer: PPO | Source: Ambulatory Visit | Attending: Internal Medicine | Admitting: Internal Medicine

## 2019-12-01 ENCOUNTER — Other Ambulatory Visit: Payer: Self-pay

## 2019-12-01 DIAGNOSIS — N312 Flaccid neuropathic bladder, not elsewhere classified: Secondary | ICD-10-CM | POA: Diagnosis not present

## 2019-12-01 DIAGNOSIS — J84112 Idiopathic pulmonary fibrosis: Secondary | ICD-10-CM | POA: Insufficient documentation

## 2019-12-01 DIAGNOSIS — N35010 Post-traumatic urethral stricture, male, meatal: Secondary | ICD-10-CM | POA: Diagnosis not present

## 2019-12-01 DIAGNOSIS — N3020 Other chronic cystitis without hematuria: Secondary | ICD-10-CM | POA: Diagnosis not present

## 2019-12-01 NOTE — Progress Notes (Signed)
Follw up phone call made to see how patient is doing with his exercise program at home since in person exercise is on hold in pulmonary rehab.  He states he is walking in the house as much as he can tolerate given his balance and gait issues.  Will continue to follow weekly.

## 2019-12-08 ENCOUNTER — Telehealth (HOSPITAL_COMMUNITY): Payer: Self-pay | Admitting: *Deleted

## 2019-12-08 ENCOUNTER — Encounter (HOSPITAL_COMMUNITY)
Admission: RE | Admit: 2019-12-08 | Discharge: 2019-12-08 | Disposition: A | Discharge status: Home / Self Care | Payer: PPO | Source: Ambulatory Visit | Attending: Internal Medicine | Admitting: Internal Medicine

## 2019-12-08 ENCOUNTER — Other Ambulatory Visit: Payer: Self-pay

## 2019-12-08 DIAGNOSIS — H6091 Unspecified otitis externa, right ear: Secondary | ICD-10-CM | POA: Diagnosis not present

## 2019-12-08 DIAGNOSIS — Z6837 Body mass index (BMI) 37.0-37.9, adult: Secondary | ICD-10-CM | POA: Diagnosis not present

## 2019-12-08 NOTE — Telephone Encounter (Signed)
Called to follow up with patient, he did not answer his phone so I called his wife.  She states he is at an MD appointment.  She says he is walking as much as possible, but is very limited because he uses a cane in one hand and cannot walk very far from an old injury.  They are considering a portable oxygen concentrator for him to use when walking.  We discussed that oxygen companies will only pay for one form of oxygen, she was not aware of this.  Also informed her to tell Kasandra Knudsen that we are resuming in person exercise in pulmonary rehab beginning 12/23/2019, he is to arrive at 1330.  She will make sure he is aware.

## 2019-12-11 ENCOUNTER — Other Ambulatory Visit: Payer: Self-pay

## 2019-12-11 ENCOUNTER — Encounter: Payer: Self-pay | Admitting: Internal Medicine

## 2019-12-11 DIAGNOSIS — J84112 Idiopathic pulmonary fibrosis: Secondary | ICD-10-CM

## 2019-12-11 DIAGNOSIS — Z006 Encounter for examination for normal comparison and control in clinical research program: Secondary | ICD-10-CM

## 2019-12-11 NOTE — Research (Addendum)
Title: GALACTIC-1 [employer] [name] [place]) is a Phase 2b, randomized, double-blind, parallel, placebo-controlled multicenter international study to evaluate evaluate the efficacy and safety of two doses (placebo v 44m v 161m of TD139 administered once a day for 52 weeks as compared to placebo in subjects with Idiopathic Pulmonary Fibrosis. Key Primary End Point: Annual rate of decline in FVC expressed in mL over 52 weeks  Protocol #: GALACTIC-1, Clinical Trials #: [id number]: [email or web address] ([place] Protocol Version 5.8 for 2/11/2021Informed Consent Version 4.8 for 12/11/2019  Informed Consent Addendum Version 1.0 for 12/11/2019  Key Features of TD139 the study drug: a Galectin-3 inhibitor designed specifically to modulate the fibrogenic response to tissue injury. In multiple models of organ fibrosis, it has been demonstrated that Gal-3 is potently pro-fibrotic, modulating the activity of fibroblasts and macrophages in chronically injured tissues. It is hypothesized that increased Gal-3 expression during chronic injury leads to fibroblast activation via the aggregation of growth factor receptor clusters (such as TGF-), thereby amplifying profibrotic signal cascades.  Key Inclusion Criteria:  Age ? 4082 Diagnosis of IPF established during the previous 3 years An historical diagnostic HRCT scan within the 12 months prior to screening. Note: a separate HRCT scan will not be performed as part of the study. Any existing SoC treatment must be deemed as stable by the PI/treating physician before randomization into the study. a. FVC > 45% of the predicted  b. DLCO (corrected for Hb) of 30% to 79% of the predicted value at screening c. FEV1/FVC ?  0.7  Women of childbearing potential agree to use highly effective birth control methods during the study  Key Exclusion Criteria  Has a history of malignancy within the last 2 years with the exception of basal cell carcinoma, chronic lymphocytic  leukaemia and prostate cancer requiring androgens, localised treatment and/or managed by observation. Is likely to receive lung transplantation within the next 12 months Currently receiving investigational therapy for IPF or administration of such therapeutics within 4 weeks of initial screening (or 5 half-lives, whichever is longer) Currently receiving high dose corticosteroids, cytotoxic, and or vasodilator therapy for pulmonary hypertension or administration of such therapeutics within 4 weeks of initial screening (or 5 half-lives, whichever is longer). A current dose of less than or equal to 15 mg/day of prednisone or its equivalent is acceptable if the dose is anticipated to remain stable during the study. Short term use (<1 month) of higher dose corticosteroid treatment is also permitted. Has a history of unstable or deteriorating cardiac or pulmonary disease (other than IPF) within the previous six months Has clinical evidence of active infection   Half-life: 3.5-8 hours For the 3 mg dose, the t1/2 is 3.5 hours. For 10 mg dose, the t1/2 is closer to 8 hours  Interactions No clinically significant CYP interactions  Safety Data Version 3.0. 08 Oct 2017  Animal studies: - No effect on central nervous system - No effect on blood pressure, heart rate or mean arterial pressure - No effect on QTc - No effect on respiratory system - No mention of renal/hepatic effect  Phase I studies: Conducted in 36 healthy patients, 41% reported treatment emergent adverse events (TEAE); all were mild in nature. The most frequently reported TEAE was dysgeusia. This occurred in 36% of patients and was transient. 8% of patients reported cough. 2 patients (5%) were diagnosed with an upper respiratory tract infection during the study, and  required treatment with antibiotics. 1 patient (2%) experienced a serious adverse event; pneumonia, this was  fatal. Little systemic effect seen thus far.  Clinical Research  Coordinator / Research RN note : This visit for Subject Stephen [name] with DOB: Mar 08, 1951 on 12/11/2019 for the above protocol is Visit #1 for Stephen [name] and is for purpose of research. During this visit the subject is still within his 12 week screening window. Subject visited site to perform additional PFTs (DLCO and spirometry), in an attempt to qualify (meet Inc/Excl criteria) for the Galactic-1 protocol The consent for this encounter is under Protocol Version 5.8 and  IS currently IRB approved. *Stephen [name] expressed continued interest and consent in continuing as a study subject. Subject confirmed that there was NO change in contact information (e.g. address, telephone, email). Subject thanked for participation in research and contribution to science.   In this visit 12/11/2019 the subject was not required to see the investigator for this visit. Today's visit is a continuation of the subjects prior screening visit on 11/21/2019. The principal investigator was made aware of this subject's visit and agreed to proceed with visit and that per sponsor and investigator a physical exam was not required. This research coordinator has verified that the investigator IS uptodate with his/her training logs  Additional details: Prior to reporting to his visit on 11/21/2019, Stephen [name] suffered from minor fall, causing an abrasion on his right hand, this was reported as an AE per sub-I, Stephen [name]. As of today's visit, the abrasion to Stephen [name]'s right hand has heeled and due to so this AE was recorded as resolved as of 12/05/2019 per Stephen [name].   Stephen [name] reported today with a new ear infection that started on 12/08/2019. He visited his local doctor and was put on a 10 day antibiotic, Stephen [name] OTIC solution. This event was recorded as an AE in the patient's chart. Will reconcile with PI. Severity appears to be mild and subject has not started study drug yet.   Prior to this visit, subject was  prescribed cough syrup, Hycodan, ordered by PI to improve a preexisting cough. Stephen [name] states that he has been taking the cough syrup daily and his cough severity and frequency has improved.  I have confirmed that the note will be routed to [place] for attestation.  Signed by  [place] Coordinator I [facility]  [place], [place] 12:09 PM 12/11/2019

## 2019-12-12 ENCOUNTER — Encounter: Payer: PPO | Admitting: Adult Health

## 2019-12-16 NOTE — Progress Notes (Addendum)
Pulmonary Individual Treatment Plan  Patient Details  Name: Stephen [name] MRN: [id number] Date of Birth: 1951/05/18 Referring Provider:     Pulmonary Rehab Walk Test from 09/11/2019 in [facility]  Referring Provider  Dr. Chase Caller      Initial Encounter Date:    Pulmonary Rehab Walk Test from 09/11/2019 in [facility]  Date  09/11/19      Visit Diagnosis: Idiopathic pulmonary fibrosis ([place])  Patient's Home Medications on Admission:   Current Outpatient Medications:  .  amitriptyline (ELAVIL) 75 MG tablet, Take 75 mg by mouth at bedtime., Disp: , Rfl:  .  amLODipine (NORVASC) 10 MG tablet, Take 10 mg by mouth daily., Disp: , Rfl:  .  aspirin EC 81 MG tablet, Take 81 mg by mouth daily., Disp: , Rfl:  .  calcium carbonate (OSCAL) 1500 (600 Ca) MG TABS tablet, Take by mouth 2 (two) times daily with a meal., Disp: , Rfl:  .  calcium carbonate (TUMS - DOSED IN MG ELEMENTAL CALCIUM) 500 MG chewable tablet, Chew 1 tablet by mouth daily., Disp: , Rfl:  .  cholecalciferol (VITAMIN D) 1000 units tablet, Take 1,000 Units by mouth daily., Disp: , Rfl:  .  cloNIDine (CATAPRES) 0.3 MG tablet, Take 0.3 mg by mouth daily after supper. , Disp: , Rfl:  .  dicyclomine (BENTYL) 10 MG capsule, Take 10 mg by mouth 3 (three) times daily before meals. , Disp: , Rfl:  .  HYDROcodone-homatropine (HYCODAN) 5-1.5 MG/5ML syrup, Take 5 mLs by mouth 3 (three) times daily as needed for cough., Disp: 450 mL, Rfl: 0 .  lansoprazole (PREVACID) 30 MG capsule, Take 30 mg by mouth daily at 12 noon., Disp: , Rfl:  .  Methylcellulose, Laxative, (CITRUCEL PO), Take 1 Scoop by mouth., Disp: , Rfl:  .  metoprolol tartrate (LOPRESSOR) 50 MG tablet, Take 1 tablet by mouth 2 (two) times daily., Disp: , Rfl:  .  montelukast (SINGULAIR) 10 MG tablet, Take 10 mg by mouth at bedtime., Disp: , Rfl:  .  Multiple Vitamin (MULTIVITAMIN WITH MINERALS) TABS tablet, Take 1  tablet by mouth daily., Disp: , Rfl:  .  nabumetone (RELAFEN) 750 MG tablet, Take 750 mg by mouth 2 (two) times daily., Disp: , Rfl:  .  olmesartan (BENICAR) 20 MG tablet, Take 1 tablet by mouth daily., Disp: , Rfl:  .  Pirfenidone (ESBRIET) 267 MG CAPS, Take 801 mg by mouth 3 (three) times daily. Takes 3 caps three times daily , Disp: , Rfl:  .  Pirfenidone (ESBRIET) 801 MG TABS, Take 1 tablet by mouth 3 (three) times daily., Disp: 270 tablet, Rfl: 0 .  polyethylene glycol (MIRALAX / GLYCOLAX) packet, Take 17 g by mouth daily., Disp: , Rfl:  .  Probiotic Product (PROBIOTIC DAILY PO), Take 1 capsule by mouth daily., Disp: , Rfl:  .  rosuvastatin (CRESTOR) 10 MG tablet, Take 10 mg by mouth daily., Disp: , Rfl:  .  sodium chloride 1 g tablet, Take 3 g by mouth 3 (three) times daily. , Disp: , Rfl:  .  spironolactone (ALDACTONE) 25 MG tablet, Take 25 mg by mouth 3 (three) times a week., Disp: , Rfl:  .  UNABLE TO FIND, Take 1 capsule by mouth 3 (three) times daily. Med Name: blood sugar harmony, Disp: , Rfl:  .  UNABLE TO FIND, Take 1 capsule by mouth 3 (three) times daily. Med Name: Clear lungs, Disp: , Rfl:  .  vitamin B-12 (CYANOCOBALAMIN) 1000 MCG tablet, Take 1,000 mcg by mouth daily., Disp: , Rfl:  .  vitamin E 400 UNIT capsule, Take 400 Units by mouth daily., Disp: , Rfl:   Past Medical History: Past Medical History:  Diagnosis Date  . Allergy   . Arthritis   . GERD (gastroesophageal reflux disease)   . Heart murmur   . Hiatal hernia   . Hyperlipidemia   . Hypertension   . Sleep apnea   . Vertigo     Tobacco Use: Social History   Tobacco Use  Smoking Status Never Smoker  Smokeless Tobacco Never Used    Labs: Recent Review Flowsheet Data    There is no flowsheet data to display.      Capillary Blood Glucose: No results found for: GLUCAP   Pulmonary Assessment Scores: Pulmonary Assessment Scores    Row Name 09/11/19 1515         ADL UCSD   ADL Phase  Entry      SOB Score total  38       CAT Score   CAT Score  17       UCSD: Self-administered rating of dyspnea associated with activities of daily living (ADLs) 6-point scale (0 = "not at all" to 5 = "maximal or unable to do because of breathlessness")  Scoring Scores range from 0 to 120.  Minimally important difference is 5 units  CAT: CAT can identify the health impairment of COPD patients and is better correlated with disease progression.  CAT has a scoring range of zero to 40. The CAT score is classified into four groups of low (less than 10), medium (10 - 20), high (21-30) and very high (31-40) based on the impact level of disease on health status. A CAT score over 10 suggests significant symptoms.  A worsening CAT score could be explained by an exacerbation, poor medication adherence, poor inhaler technique, or progression of COPD or comorbid conditions.  CAT MCID is 2 points  mMRC: mMRC (Modified Medical Research Council) Dyspnea Scale is used to assess the degree of baseline functional disability in patients of respiratory disease due to dyspnea. No minimal important difference is established. A decrease in score of 1 point or greater is considered a positive change.   Pulmonary Function Assessment: Pulmonary Function Assessment - 09/11/19 1443      Breath   Bilateral Breath Sounds  Clear    Shortness of Breath  Yes;Limiting activity       Exercise Target Goals: Exercise Program Goal: Individual exercise prescription set using results from initial 6 min walk test and THRR while considering  patient's activity barriers and safety.   Exercise Prescription Goal: Initial exercise prescription builds to 30-45 minutes a day of aerobic activity, 2-3 days per week.  Home exercise guidelines will be given to patient during program as part of exercise prescription that the participant will acknowledge.  Activity Barriers & Risk Stratification: Activity Barriers & Cardiac Risk Stratification -  09/11/19 1441      Activity Barriers & Cardiac Risk Stratification   Activity Barriers  Arthritis;Deconditioning;Muscular Weakness;Shortness of Breath       6 Minute Walk: 6 Minute Walk    Row Name 09/11/19 1533         6 Minute Walk   Phase  Initial     Distance  600 feet     Walk Time  5.25 minutes     # of Rest Breaks  1  MPH  1.14     METS  1.34     RPE  13     Perceived Dyspnea   2     VO2 Peak  4.69     Symptoms  Yes (comment)     Comments  45 sec standing break due to desaturation     Resting HR  78 bpm     Resting BP  134/80     Resting Oxygen Saturation   96 %     Exercise Oxygen Saturation  during 6 min walk  80 %     Max Ex. HR  104 bpm     Max Ex. BP  152/82     2 Minute Post BP  132/84       Interval HR   1 Minute HR  97     2 Minute HR  101     3 Minute HR  104     4 Minute HR  93     5 Minute HR  94     6 Minute HR  96     2 Minute Post HR  91     Interval Heart Rate?  Yes       Interval Oxygen   Interval Oxygen?  Yes     Baseline Oxygen Saturation %  96 %     1 Minute Oxygen Saturation %  90 %     1 Minute Liters of Oxygen  0 L     2 Minute Oxygen Saturation %  86 %     2 Minute Liters of Oxygen  0 L     3 Minute Oxygen Saturation %  84 %     3 Minute Liters of Oxygen  0 L     4 Minute Oxygen Saturation %  80 %     4 Minute Liters of Oxygen  2 L     5 Minute Oxygen Saturation %  92 %     5 Minute Liters of Oxygen  2 L     6 Minute Oxygen Saturation %  93 %     6 Minute Liters of Oxygen  2 L     2 Minute Post Oxygen Saturation %  97 %     2 Minute Post Liters of Oxygen  2 L        Oxygen Initial Assessment: Oxygen Initial Assessment - 09/11/19 1532      Home Oxygen   Home Oxygen Device  None    Sleep Oxygen Prescription  CPAP    Home Exercise Oxygen Prescription  Continuous    Liters per minute  2    Home at Rest Exercise Oxygen Prescription  None    Compliance with Home Oxygen Use  Yes      Initial 6 min Walk   Oxygen Used   Continuous    Liters per minute  2      Program Oxygen Prescription   Program Oxygen Prescription  Continuous    Liters per minute  2      Intervention   Short Term Goals  To learn and exhibit compliance with exercise, home and travel O2 prescription;To learn and understand importance of monitoring SPO2 with pulse oximeter and demonstrate accurate use of the pulse oximeter.;To learn and understand importance of maintaining oxygen saturations>88%;To learn and demonstrate proper pursed lip breathing techniques or other breathing techniques.;To learn and demonstrate proper use of respiratory medications    Long  Term  Goals  Exhibits compliance with exercise, home and travel O2 prescription;Maintenance of O2 saturations>88%;Verbalizes importance of monitoring SPO2 with pulse oximeter and return demonstration;Exhibits proper breathing techniques, such as pursed lip breathing or other method taught during program session;Compliance with respiratory medication       Oxygen Re-Evaluation: Oxygen Re-Evaluation    Row Name 09/16/19 1456 10/13/19 1021 12/16/19 0913         Program Oxygen Prescription   Program Oxygen Prescription  Continuous  Continuous  Continuous     Liters per minute  _0 Home Oxygen   Home Oxygen Device  None  E-Tanks  E-Tanks     Sleep Oxygen Prescription  CPAP  CPAP  CPAP     Home Exercise Oxygen Prescription  Continuous  Continuous  Continuous     Liters per minute  _1 Home at Rest Exercise Oxygen Prescription  None  None  None     Compliance with Home Oxygen Use  Yes  Yes  Yes       Goals/Expected Outcomes   Short Term Goals  To learn and exhibit compliance with exercise, home and travel O2 prescription;To learn and understand importance of monitoring SPO2 with pulse oximeter and demonstrate accurate use of the pulse oximeter.;To learn and understand importance of maintaining oxygen saturations>88%;To learn and demonstrate proper pursed lip breathing  techniques or other breathing techniques.;To learn and demonstrate proper use of respiratory medications  To learn and exhibit compliance with exercise, home and travel O2 prescription;To learn and understand importance of monitoring SPO2 with pulse oximeter and demonstrate accurate use of the pulse oximeter.;To learn and understand importance of maintaining oxygen saturations>88%;To learn and demonstrate proper pursed lip breathing techniques or other breathing techniques.;To learn and demonstrate proper use of respiratory medications  To learn and exhibit compliance with exercise, home and travel O2 prescription;To learn and understand importance of monitoring SPO2 with pulse oximeter and demonstrate accurate use of the pulse oximeter.;To learn and understand importance of maintaining oxygen saturations>88%;To learn and demonstrate proper pursed lip breathing techniques or other breathing techniques.;To learn and demonstrate proper use of respiratory medications     Long  Term Goals  Exhibits compliance with exercise, home and travel O2 prescription;Maintenance of O2 saturations>88%;Verbalizes importance of monitoring SPO2 with pulse oximeter and return demonstration;Exhibits proper breathing techniques, such as pursed lip breathing or other method taught during program session;Compliance with respiratory medication  Exhibits compliance with exercise, home and travel O2 prescription;Maintenance of O2 saturations>88%;Verbalizes importance of monitoring SPO2 with pulse oximeter and return demonstration;Exhibits proper breathing techniques, such as pursed lip breathing or other method taught during program session;Compliance with respiratory medication  Exhibits compliance with exercise, home and travel O2 prescription;Maintenance of O2 saturations>88%;Verbalizes importance of monitoring SPO2 with pulse oximeter and return demonstration;Exhibits proper breathing techniques, such as pursed lip breathing or  other method taught during program session;Compliance with respiratory medication     Goals/Expected Outcomes  compliance  compliance  compliance        Oxygen Discharge (Final Oxygen Re-Evaluation): Oxygen Re-Evaluation - 12/16/19 0913      Program Oxygen Prescription   Program Oxygen Prescription  Continuous    Liters per minute  2      Home Oxygen   Home Oxygen Device  E-Tanks    Sleep Oxygen Prescription  CPAP    Home Exercise Oxygen Prescription  Continuous    Liters per minute  2    Home at Rest Exercise Oxygen Prescription  None    Compliance with Home Oxygen Use  Yes      Goals/Expected Outcomes   Short Term Goals  To learn and exhibit compliance with exercise, home and travel O2 prescription;To learn and understand importance of monitoring SPO2 with pulse oximeter and demonstrate accurate use of the pulse oximeter.;To learn and understand importance of maintaining oxygen saturations>88%;To learn and demonstrate proper pursed lip breathing techniques or other breathing techniques.;To learn and demonstrate proper use of respiratory medications    Long  Term Goals  Exhibits compliance with exercise, home and travel O2 prescription;Maintenance of O2 saturations>88%;Verbalizes importance of monitoring SPO2 with pulse oximeter and return demonstration;Exhibits proper breathing techniques, such as pursed lip breathing or other method taught during program session;Compliance with respiratory medication    Goals/Expected Outcomes  compliance       Initial Exercise Prescription: Initial Exercise Prescription - 09/11/19 1500      Date of Initial Exercise RX and Referring Provider   Date  09/11/19    Referring Provider  Dr. Chase Caller      Oxygen   Oxygen  Continuous    Liters  2      NuStep   Level  2    SPM  80    Minutes  15      Arm Ergometer   Level  1    Minutes  15      Prescription Details   Frequency (times per week)  2    Duration  Progress to 30 minutes of  continuous aerobic without signs/symptoms of physical distress      Intensity   THRR 40-80% of Max Heartrate  61-122    Ratings of Perceived Exertion  11-13    Perceived Dyspnea  0-4      Progression   Progression  Continue progressive overload as per policy without signs/symptoms or physical distress.      Resistance Training   Training Prescription  Yes    Weight  blue bands    Reps  10-15       Perform Capillary Blood Glucose checks as needed.  Exercise Prescription Changes: Exercise Prescription Changes    Row Name 09/16/19 1500 09/30/19 1500 10/14/19 1500 10/16/19 1400 10/28/19 1500     Response to Exercise   Blood Pressure (Admit)  140/80  136/60  158/70  --  140/68   Blood Pressure (Exercise)  144/80  136/76  160/80  --  148/72   Blood Pressure (Exit)  140/82  128/70  118/78  --  124/72   Heart Rate (Admit)  73 bpm  78 bpm  68 bpm  --  79 bpm   Heart Rate (Exercise)  84 bpm  91 bpm  78 bpm  --  85 bpm   Heart Rate (Exit)  79 bpm  80 bpm  72 bpm  --  76 bpm   Oxygen Saturation (Admit)  97 %  94 %  99 %  --  95 %   Oxygen Saturation (Exercise)  97 %  96 %  96 %  --  96 %   Oxygen Saturation (Exit)  98 %  97 %  99 %  --  95 %   Rating of Perceived Exertion (Exercise)  _0 --  13   Perceived Dyspnea (Exercise)  _1 --  1   Duration  Continue with 30 min of  aerobic exercise without signs/symptoms of physical distress.  Continue with 30 min of aerobic exercise without signs/symptoms of physical distress.  Continue with 30 min of aerobic exercise without signs/symptoms of physical distress.  --  Continue with 30 min of aerobic exercise without signs/symptoms of physical distress.   Intensity  THRR unchanged  THRR unchanged  THRR unchanged  --  THRR unchanged     Progression   Progression  Continue to progress workloads to maintain intensity without signs/symptoms of physical distress.  Continue to progress workloads to maintain intensity without signs/symptoms of  physical distress.  Continue to progress workloads to maintain intensity without signs/symptoms of physical distress.  --  Continue to progress workloads to maintain intensity without signs/symptoms of physical distress.     Resistance Training   Training Prescription  Yes  Yes  Yes  --  Yes   Weight  blue bands  blue bands  blue bands  --  blue bands   Reps  10-15  10-15  10-15  --  10-15   Time  10 Minutes  10 Minutes  10 Minutes  --  10 Minutes     Oxygen   Oxygen  Continuous  Continuous  Continuous  --  Continuous   Liters  _0 --  2     NuStep   Level  _1 --  4   SPM  80  80  80  --  80   Minutes  _2 --  15   METs  1.7  1.7  1.7  --  1.7     Arm Ergometer   Level  _3 --  2.5   Minutes  _4 --  15     Home Exercise Plan   Plans to continue exercise at  --  --  --  Home (comment)  --   Frequency  --  --  --  Add 1 additional day to program exercise sessions.  --   Initial Home Exercises Provided  --  --  --  10/16/19  --      Exercise Comments: Exercise Comments    Row Name 10/16/19 1449           Exercise Comments  home exercise complete          Exercise Goals and Review: Exercise Goals    Row Name 09/11/19 1548 09/16/19 1457 10/13/19 1022 12/16/19 0914       Exercise Goals   Increase Physical Activity  Yes  Yes  Yes  Yes    Intervention  Provide advice, education, support and counseling about physical activity/exercise needs.;Develop an individualized exercise prescription for aerobic and resistive training based on initial evaluation findings, risk stratification, comorbidities and participant's personal goals.  Provide advice, education, support and counseling about physical activity/exercise needs.;Develop an individualized exercise prescription for aerobic and resistive training based on initial evaluation findings, risk stratification, comorbidities and participant's personal goals.  Provide advice, education, support and  counseling about physical activity/exercise needs.;Develop an individualized exercise prescription for aerobic and resistive training based on initial evaluation findings, risk stratification, comorbidities and participant's personal goals.  Provide advice, education, support and counseling about physical activity/exercise needs.;Develop an individualized exercise prescription for aerobic and resistive training based on initial evaluation findings, risk stratification, comorbidities and participant's personal goals.    Expected Outcomes  Long Term: Exercising regularly  at least 3-5 days a week.;Long Term: Add in home exercise to make exercise part of routine and to increase amount of physical activity.;Short Term: Attend rehab on a regular basis to increase amount of physical activity.  Long Term: Exercising regularly at least 3-5 days a week.;Long Term: Add in home exercise to make exercise part of routine and to increase amount of physical activity.;Short Term: Attend rehab on a regular basis to increase amount of physical activity.  Long Term: Exercising regularly at least 3-5 days a week.;Long Term: Add in home exercise to make exercise part of routine and to increase amount of physical activity.;Short Term: Attend rehab on a regular basis to increase amount of physical activity.  Long Term: Exercising regularly at least 3-5 days a week.;Long Term: Add in home exercise to make exercise part of routine and to increase amount of physical activity.;Short Term: Attend rehab on a regular basis to increase amount of physical activity.    Increase Strength and Stamina  Yes  Yes  Yes  Yes    Intervention  Provide advice, education, support and counseling about physical activity/exercise needs.;Develop an individualized exercise prescription for aerobic and resistive training based on initial evaluation findings, risk stratification, comorbidities and participant's personal goals.  Provide advice, education, support  and counseling about physical activity/exercise needs.;Develop an individualized exercise prescription for aerobic and resistive training based on initial evaluation findings, risk stratification, comorbidities and participant's personal goals.  Provide advice, education, support and counseling about physical activity/exercise needs.;Develop an individualized exercise prescription for aerobic and resistive training based on initial evaluation findings, risk stratification, comorbidities and participant's personal goals.  Provide advice, education, support and counseling about physical activity/exercise needs.;Develop an individualized exercise prescription for aerobic and resistive training based on initial evaluation findings, risk stratification, comorbidities and participant's personal goals.    Expected Outcomes  Short Term: Increase workloads from initial exercise prescription for resistance, speed, and METs.;Short Term: Perform resistance training exercises routinely during rehab and add in resistance training at home;Long Term: Improve cardiorespiratory fitness, muscular endurance and strength as measured by increased METs and functional capacity (6MWT)  Short Term: Increase workloads from initial exercise prescription for resistance, speed, and METs.;Short Term: Perform resistance training exercises routinely during rehab and add in resistance training at home;Long Term: Improve cardiorespiratory fitness, muscular endurance and strength as measured by increased METs and functional capacity (6MWT)  Short Term: Increase workloads from initial exercise prescription for resistance, speed, and METs.;Short Term: Perform resistance training exercises routinely during rehab and add in resistance training at home;Long Term: Improve cardiorespiratory fitness, muscular endurance and strength as measured by increased METs and functional capacity (6MWT)  Short Term: Increase workloads from initial exercise prescription  for resistance, speed, and METs.;Short Term: Perform resistance training exercises routinely during rehab and add in resistance training at home;Long Term: Improve cardiorespiratory fitness, muscular endurance and strength as measured by increased METs and functional capacity (6MWT)    Able to understand and use rate of perceived exertion (RPE) scale  Yes  Yes  Yes  Yes    Intervention  Provide education and explanation on how to use RPE scale  Provide education and explanation on how to use RPE scale  Provide education and explanation on how to use RPE scale  Provide education and explanation on how to use RPE scale    Expected Outcomes  Short Term: Able to use RPE daily in rehab to express subjective intensity level;Long Term:  Able to use RPE to guide intensity  level when exercising independently  Short Term: Able to use RPE daily in rehab to express subjective intensity level;Long Term:  Able to use RPE to guide intensity level when exercising independently  Short Term: Able to use RPE daily in rehab to express subjective intensity level;Long Term:  Able to use RPE to guide intensity level when exercising independently  Short Term: Able to use RPE daily in rehab to express subjective intensity level;Long Term:  Able to use RPE to guide intensity level when exercising independently    Able to understand and use Dyspnea scale  Yes  Yes  Yes  Yes    Intervention  Provide education and explanation on how to use Dyspnea scale  Provide education and explanation on how to use Dyspnea scale  Provide education and explanation on how to use Dyspnea scale  Provide education and explanation on how to use Dyspnea scale    Expected Outcomes  Short Term: Able to use Dyspnea scale daily in rehab to express subjective sense of shortness of breath during exertion;Long Term: Able to use Dyspnea scale to guide intensity level when exercising independently  Short Term: Able to use Dyspnea scale daily in rehab to express  subjective sense of shortness of breath during exertion;Long Term: Able to use Dyspnea scale to guide intensity level when exercising independently  Short Term: Able to use Dyspnea scale daily in rehab to express subjective sense of shortness of breath during exertion;Long Term: Able to use Dyspnea scale to guide intensity level when exercising independently  Short Term: Able to use Dyspnea scale daily in rehab to express subjective sense of shortness of breath during exertion;Long Term: Able to use Dyspnea scale to guide intensity level when exercising independently    Knowledge and understanding of Target Heart Rate Range (THRR)  Yes  Yes  Yes  Yes    Intervention  --  --  Provide education and explanation of THRR including how the numbers were predicted and where they are located for reference  --    Expected Outcomes  Short Term: Able to state/look up THRR;Short Term: Able to use daily as guideline for intensity in rehab;Long Term: Able to use THRR to govern intensity when exercising independently  Short Term: Able to state/look up THRR;Short Term: Able to use daily as guideline for intensity in rehab;Long Term: Able to use THRR to govern intensity when exercising independently  Short Term: Able to state/look up THRR;Short Term: Able to use daily as guideline for intensity in rehab;Long Term: Able to use THRR to govern intensity when exercising independently  Short Term: Able to state/look up THRR;Short Term: Able to use daily as guideline for intensity in rehab;Long Term: Able to use THRR to govern intensity when exercising independently    Understanding of Exercise Prescription  Yes  Yes  Yes  Yes    Intervention  Provide education, explanation, and written materials on patient's individual exercise prescription  Provide education, explanation, and written materials on patient's individual exercise prescription  Provide education, explanation, and written materials on patient's individual exercise  prescription  Provide education, explanation, and written materials on patient's individual exercise prescription    Expected Outcomes  Short Term: Able to explain program exercise prescription;Long Term: Able to explain home exercise prescription to exercise independently  Short Term: Able to explain program exercise prescription;Long Term: Able to explain home exercise prescription to exercise independently  Short Term: Able to explain program exercise prescription;Long Term: Able to explain home exercise prescription to exercise  independently  Short Term: Able to explain program exercise prescription;Long Term: Able to explain home exercise prescription to exercise independently       Exercise Goals Re-Evaluation : Exercise Goals Re-Evaluation    Row Name 09/16/19 1458 10/13/19 1022 12/16/19 0914         Exercise Goal Re-Evaluation   Exercise Goals Review  Increase Physical Activity;Increase Strength and Stamina;Able to understand and use rate of perceived exertion (RPE) scale;Able to understand and use Dyspnea scale;Knowledge and understanding of Target Heart Rate Range (THRR);Understanding of Exercise Prescription  Increase Physical Activity;Increase Strength and Stamina;Able to understand and use rate of perceived exertion (RPE) scale;Able to understand and use Dyspnea scale;Knowledge and understanding of Target Heart Rate Range (THRR);Understanding of Exercise Prescription  Increase Physical Activity;Increase Strength and Stamina;Able to understand and use rate of perceived exertion (RPE) scale;Able to understand and use Dyspnea scale;Knowledge and understanding of Target Heart Rate Range (THRR);Understanding of Exercise Prescription     Comments  Pt has attended 1 exercise session. Pt exercised at 1.7 METs on the stepper. Will monitor and progress as able.  Pt has attended 7 exercise sessions. Pt is progressing slow, but maintains a positive outlook. Pt currently exercises at 1.7 METs on the  stepper. Will continue to monitor and progress as able.  Pt has been walking in his house since we ceased in person exercise. Pt will return 2/23. Will monitor and progress as able.     Expected Outcomes  Through exercise at rehab and at home, the patient will decrease shortness of breath with daily activities and feel confident in carrying out an exercise regime at home.  Through exercise at rehab and at home, the patient will decrease shortness of breath with daily activities and feel confident in carrying out an exercise regime at home.  Through exercise at rehab and at home, the patient will decrease shortness of breath with daily activities and feel confident in carrying out an exercise regime at home.        Discharge Exercise Prescription (Final Exercise Prescription Changes): Exercise Prescription Changes - 10/28/19 1500      Response to Exercise   Blood Pressure (Admit)  140/68    Blood Pressure (Exercise)  148/72    Blood Pressure (Exit)  124/72    Heart Rate (Admit)  79 bpm    Heart Rate (Exercise)  85 bpm    Heart Rate (Exit)  76 bpm    Oxygen Saturation (Admit)  95 %    Oxygen Saturation (Exercise)  96 %    Oxygen Saturation (Exit)  95 %    Rating of Perceived Exertion (Exercise)  13    Perceived Dyspnea (Exercise)  1    Duration  Continue with 30 min of aerobic exercise without signs/symptoms of physical distress.    Intensity  THRR unchanged      Progression   Progression  Continue to progress workloads to maintain intensity without signs/symptoms of physical distress.      Resistance Training   Training Prescription  Yes    Weight  blue bands    Reps  10-15    Time  10 Minutes      Oxygen   Oxygen  Continuous    Liters  2      NuStep   Level  4    SPM  80    Minutes  15    METs  1.7      Arm Ergometer   Level  2.5  Minutes  15       Nutrition:  Target Goals: Understanding of nutrition guidelines, daily intake of sodium <1576m, cholesterol <203m  calories 30% from fat and 7% or less from saturated fats, daily to have 5 or more servings of fruits and vegetables.  Biometrics: Pre Biometrics - 09/11/19 1442      Pre Biometrics   Height  _0  (1.702 m)    Weight  112.2 kg    BMI (Calculated)  38.73        Nutrition Therapy Plan and Nutrition Goals: Nutrition Therapy & Goals - 10/02/19 1519      Nutrition Therapy   Diet  Low Sodium/Therapeutic lifestyle changes    Drug/Food Interactions  Statins/Certain Fruits      Personal Nutrition Goals   Nutrition Goal  Pt to identify food quantities necessary to achieve weight loss of 6-24 lb at graduation from cardiac rehab.    Personal Goal #2  Pt to build a healthy plate including vegetables, fruits, whole grains, and low-fat dairy products in a heart healthy meal plan.      Intervention Plan   Intervention  Prescribe, educate and counsel regarding individualized specific dietary modifications aiming towards targeted core components such as weight, hypertension, lipid management, diabetes, heart failure and other comorbidities.;Nutrition handout(s) given to patient.    Expected Outcomes  Short Term Goal: A plan has been developed with personal nutrition goals set during dietitian appointment.;Long Term Goal: Adherence to prescribed nutrition plan.       Nutrition Assessments: Nutrition Assessments - 09/19/19 0726      Rate Your Plate Scores   Pre Score  48       Nutrition Goals Re-Evaluation: Nutrition Goals Re-Evaluation    Row Name 10/02/19 1527             Goals   Current Weight  245 lb (111.1 kg)       Nutrition Goal  Pt to identify food quantities necessary to achieve weight loss of 6-24 lb at graduation from cardiac rehab.         Personal Goal #2 Re-Evaluation   Personal Goal #2  Pt to build a healthy plate including vegetables, fruits, whole grains, and low-fat dairy products in a heart healthy meal plan.          Nutrition Goals Discharge (Final Nutrition  Goals Re-Evaluation): Nutrition Goals Re-Evaluation - 10/02/19 1527      Goals   Current Weight  245 lb (111.1 kg)    Nutrition Goal  Pt to identify food quantities necessary to achieve weight loss of 6-24 lb at graduation from cardiac rehab.      Personal Goal #2 Re-Evaluation   Personal Goal #2  Pt to build a healthy plate including vegetables, fruits, whole grains, and low-fat dairy products in a heart healthy meal plan.       Psychosocial: Target Goals: Acknowledge presence or absence of significant depression and/or stress, maximize coping skills, provide positive support system. Participant is able to verbalize types and ability to use techniques and skills needed for reducing stress and depression.  Initial Review & Psychosocial Screening: Initial Psych Review & Screening - 09/11/19 1444      Initial Review   Current issues with  None Identified      Family Dynamics   Good Support System?  Yes      Barriers   Psychosocial barriers to participate in program  There are no identifiable barriers or psychosocial needs.  Screening Interventions   Interventions  Encouraged to exercise       Quality of Life Scores:  Scores of 19 and below usually indicate a poorer quality of life in these areas.  A difference of  2-3 points is a clinically meaningful difference.  A difference of 2-3 points in the total score of the Quality of Life Index has been associated with significant improvement in overall quality of life, self-image, physical symptoms, and general health in studies assessing change in quality of life.  PHQ-9: Recent Review Flowsheet Data    Depression screen [facility] 2/9 09/11/2019 09/11/2019   Decreased Interest - 0   Down, Depressed, Hopeless 0 0   PHQ - 2 Score 0 0   Altered sleeping 0 -   Tired, decreased energy 0 -   Change in appetite 0 -   Feeling bad or failure about yourself  0 -   Trouble concentrating 0 -   Moving slowly or fidgety/restless 0 -    Suicidal thoughts 0 -   PHQ-9 Score 0 -   Difficult doing work/chores Not difficult at all -     Interpretation of Total Score  Total Score Depression Severity:  1-4 = Minimal depression, 5-9 = Mild depression, 10-14 = Moderate depression, 15-19 = Moderately severe depression, 20-27 = Severe depression   Psychosocial Evaluation and Intervention: Psychosocial Evaluation - 09/11/19 1445      Psychosocial Evaluation & Interventions   Interventions  Encouraged to exercise with the program and follow exercise prescription    Continue Psychosocial Services   No Follow up required       Psychosocial Re-Evaluation: Psychosocial Re-Evaluation    [place] Name 09/11/19 1445 09/16/19 1551 10/13/19 1326 12/15/19 1413       Psychosocial Re-Evaluation   Current issues with  None Identified  None Identified  None Identified  None Identified    Comments  --  no barriers identified  Stephen [name] has no barriers or psychosocial concerns identified at this time.  department closure x 7 weeks, reopen 12/23/2019, will address psychosocial issues if there are any present upon return to pulmonary rehab.    Expected Outcomes  --  That patient will continue to have no barriers or psychosocial concerns while in pulmonary rehab.  That Stephen [name] continues to have no barriers or psychsocial concerns while participating in pulmonary rehab.  No psychosocial barriers or concerns while in pulmonary rehab.    Interventions  Encouraged to attend Pulmonary Rehabilitation for the exercise  --  Encouraged to attend Pulmonary Rehabilitation for the exercise  Encouraged to attend Pulmonary Rehabilitation for the exercise    Continue Psychosocial Services   --  --  No Follow up required  No Follow up required       Psychosocial Discharge (Final Psychosocial Re-Evaluation): Psychosocial Re-Evaluation - 12/15/19 1413      Psychosocial Re-Evaluation   Current issues with  None Identified    Comments  department closure x 7 weeks, reopen  12/23/2019, will address psychosocial issues if there are any present upon return to pulmonary rehab.    Expected Outcomes  No psychosocial barriers or concerns while in pulmonary rehab.    Interventions  Encouraged to attend Pulmonary Rehabilitation for the exercise    Continue Psychosocial Services   No Follow up required       Education: Education Goals: Education classes will be provided on a weekly basis, covering required topics. Participant will state understanding/return demonstration of topics presented.  Learning Barriers/Preferences:  Education Topics: Risk Factor Reduction:  -Group instruction that is supported by a PowerPoint presentation. Instructor discusses the definition of a risk factor, different risk factors for pulmonary disease, and how the heart and lungs work together.     PULMONARY REHAB OTHER RESPIRATORY from 10/21/2019 in [place]  Date  09/23/19  Educator  DF  Instruction Review Code  2- Demonstrated Understanding      Nutrition for Pulmonary Patient:  -Group instruction provided by PowerPoint slides, verbal discussion, and written materials to support subject matter. The instructor gives an explanation and review of healthy diet recommendations, which includes a discussion on weight management, recommendations for fruit and vegetable consumption, as well as protein, fluid, caffeine, fiber, sodium, sugar, and alcohol. Tips for eating when patients are short of breath are discussed.   PULMONARY REHAB OTHER RESPIRATORY from 10/21/2019 in [place]  Date  10/21/19  Educator  -- [Handout]      Pursed Lip Breathing:  -Group instruction that is supported by demonstration and informational handouts. Instructor discusses the benefits of pursed lip and diaphragmatic breathing and detailed demonstration on how to preform both.     Oxygen Safety:  -Group instruction provided by PowerPoint, verbal  discussion, and written material to support subject matter. There is an overview of "What is Oxygen" and "Why do we need it".  Instructor also reviews how to create a safe environment for oxygen use, the importance of using oxygen as prescribed, and the risks of noncompliance. There is a brief discussion on traveling with oxygen and resources the patient may utilize.   Oxygen Equipment:  -Group instruction provided by [facility] Staff utilizing handouts, written materials, and equipment demonstrations.   Signs and Symptoms:  -Group instruction provided by written material and verbal discussion to support subject matter. Warning signs and symptoms of infection, stroke, and heart attack are reviewed and when to call the physician/911 reinforced. Tips for preventing the spread of infection discussed.   Advanced Directives:  -Group instruction provided by verbal instruction and written material to support subject matter. Instructor reviews Advanced Directive laws and proper instruction for filling out document.   Pulmonary Video:  -Group video education that reviews the importance of medication and oxygen compliance, exercise, good nutrition, pulmonary hygiene, and pursed lip and diaphragmatic breathing for the pulmonary patient.   Exercise for the Pulmonary Patient:  -Group instruction that is supported by a PowerPoint presentation. Instructor discusses benefits of exercise, core components of exercise, frequency, duration, and intensity of an exercise routine, importance of utilizing pulse oximetry during exercise, safety while exercising, and options of places to exercise outside of rehab.     Pulmonary Medications:  -Verbally interactive group education provided by instructor with focus on inhaled medications and proper administration.   PULMONARY REHAB OTHER RESPIRATORY from 10/21/2019 in [place]  Date  10/16/19      Anatomy and Physiology of the  Respiratory System and Intimacy:  -Group instruction provided by PowerPoint, verbal discussion, and written material to support subject matter. Instructor reviews respiratory cycle and anatomical components of the respiratory system and their functions. Instructor also reviews differences in obstructive and restrictive respiratory diseases with examples of each. Intimacy, Sex, and Sexuality differences are reviewed with a discussion on how relationships can change when diagnosed with pulmonary disease. Common sexual concerns are reviewed.   PULMONARY REHAB OTHER RESPIRATORY from 10/21/2019 in [place]  Date  09/30/19  Educator  -- [Handout]      MD DAY -A group question and answer session with a medical doctor that allows participants to ask questions that relate to their pulmonary disease state.   OTHER EDUCATION -Group or individual verbal, written, or video instructions that support the educational goals of the pulmonary rehab program.   Holiday Eating Survival Tips:  -Group instruction provided by PowerPoint slides, verbal discussion, and written materials to support subject matter. The instructor gives patients tips, tricks, and techniques to help them not only survive but enjoy the holidays despite the onslaught of food that accompanies the holidays.   PULMONARY REHAB OTHER RESPIRATORY from 10/21/2019 in [place]  Date  09/16/19  Educator  -- [handout]      Knowledge Questionnaire Score: Knowledge Questionnaire Score - 09/11/19 1513      Knowledge Questionnaire Score   Pre Score  8/18       Core Components/Risk Factors/Patient Goals at Admission: Personal Goals and Risk Factors at Admission - 09/11/19 1445      Core Components/Risk Factors/Patient Goals on Admission   Improve shortness of breath with ADL's  Yes    Intervention  Provide education, individualized exercise plan and daily activity instruction  to help decrease symptoms of SOB with activities of daily living.    Expected Outcomes  Short Term: Improve cardiorespiratory fitness to achieve a reduction of symptoms when performing ADLs;Long Term: Be able to perform more ADLs without symptoms or delay the onset of symptoms       Core Components/Risk Factors/Patient Goals Review:  Goals and Risk Factor Review    Row Name 09/11/19 1445 09/16/19 1553 10/13/19 1328 12/15/19 1415       Core Components/Risk Factors/Patient Goals Review   Personal Goals Review  Develop more efficient breathing techniques such as purse lipped breathing and diaphragmatic breathing and practicing self-pacing with activity.;Increase knowledge of respiratory medications and ability to use respiratory devices properly.;Improve shortness of breath with ADL's  (Pended)   Develop more efficient breathing techniques such as purse lipped breathing and diaphragmatic breathing and practicing self-pacing with activity.;Increase knowledge of respiratory medications and ability to use respiratory devices properly.;Improve shortness of breath with ADL's  Develop more efficient breathing techniques such as purse lipped breathing and diaphragmatic breathing and practicing self-pacing with activity.;Increase knowledge of respiratory medications and ability to use respiratory devices properly.;Improve shortness of breath with ADL's  Develop more efficient breathing techniques such as purse lipped breathing and diaphragmatic breathing and practicing self-pacing with activity.;Increase knowledge of respiratory medications and ability to use respiratory devices properly.;Improve shortness of breath with ADL's    Review  --  Today was his first day of exercise class, too early to see progress towards admission goals.  Stephen [name] has been in pulmonary rehab for 3 1/2 weeks.  He is exercising on level 2 of the arm ergomenter, and level 3 on the nustep.  His attendance is great and works hard while he is  here.  Department closure x 7 weeks, will reopen 12/23/2019, will begin working on admission goals upon return to program.    Expected Outcomes  --  See admission goals.  See admission goals.  See admission goals.       Core Components/Risk Factors/Patient Goals at Discharge (Final Review):  Goals and Risk Factor Review - 12/15/19 1415      Core Components/Risk Factors/Patient Goals Review   Personal Goals Review  Develop more efficient breathing techniques such as  purse lipped breathing and diaphragmatic breathing and practicing self-pacing with activity.;Increase knowledge of respiratory medications and ability to use respiratory devices properly.;Improve shortness of breath with ADL's    Review  Department closure x 7 weeks, will reopen 12/23/2019, will begin working on admission goals upon return to program.    Expected Outcomes  See admission goals.       ITP Comments:   Comments: ITP REVIEW Pt is making expected progress toward pulmonary rehab goals after completing 13 sessions. Recommend continued exercise, life style modification, education, and utilization of breathing techniques to increase stamina and strength and decrease shortness of breath with exertion.

## 2019-12-20 DIAGNOSIS — J84112 Idiopathic pulmonary fibrosis: Secondary | ICD-10-CM | POA: Diagnosis not present

## 2019-12-22 ENCOUNTER — Encounter: Payer: Self-pay | Admitting: Internal Medicine

## 2019-12-22 ENCOUNTER — Encounter (INDEPENDENT_AMBULATORY_CARE_PROVIDER_SITE_OTHER): Payer: PPO | Admitting: Internal Medicine

## 2019-12-22 ENCOUNTER — Encounter: Payer: PPO | Admitting: Internal Medicine

## 2019-12-22 ENCOUNTER — Other Ambulatory Visit: Payer: Self-pay

## 2019-12-22 VITALS — BP 138/78 | HR 60 | Temp 98.1°F | Resp 20 | Ht 66.34 in | Wt 247.6 lb

## 2019-12-22 DIAGNOSIS — Z006 Encounter for examination for normal comparison and control in clinical research program: Secondary | ICD-10-CM

## 2019-12-22 DIAGNOSIS — J84112 Idiopathic pulmonary fibrosis: Secondary | ICD-10-CM

## 2019-12-22 DIAGNOSIS — L299 Pruritus, unspecified: Secondary | ICD-10-CM

## 2019-12-22 NOTE — Patient Instructions (Signed)
Research study patient IPF (idiopathic pulmonary fibrosis) (HCC)  -Clinically stable -Continue Esbriet/pirfenidone as before -Proceed with randomization visit today for research  Pruritus  -Mild and 2 episodes -Related to seafood and not related to study drug but possibly related to pirfenidone  Plan  -Ensure applying sunscreen when you go out especially when taking Esbriet -List seafood allergy   Follow-up  -Per study protocol

## 2019-12-22 NOTE — Progress Notes (Addendum)
Title: GALACTIC-1 [facility]) is a Phase 2b, randomized, double-blind, parallel, placebo-controlled multicenter international study to evaluate evaluate the efficacy and safety of two doses (placebo v 47m v 144m of TD139 administered once a day for 52 weeks as compared to placebo in subjects with Idiopathic Pulmonary Fibrosis. Key Primary End Point: Annual rate of decline in FVC expressed in mL over 52 weeks  Protocol #: GALACTIC-1, Clinical Trials #: [id number]: [email or web address] ([employer] [place]    Protocol Version V5.8 US_dated [phone] IB Version 10.0 dated 19Nov2020   ICF:  1)  Consent Form Addendum (IRB approved Jun 10, 2019)  2) Main-: IRB version 4.0 dated Jul 25, 2019;  3) Optional Pharmacogenetic testing: IRB version 2.0 dated Oct 02, 2018    Key Features of TD139 the study drug: a Galectin-3 inhibitor designed specifically to modulate the fibrogenic response to tissue injury. In multiple models of organ fibrosis, it has been demonstrated that Gal-3 is potently pro-fibrotic, modulating the activity of fibroblasts and macrophages in chronically injured tissues. It is hypothesized that increased Gal-3 expression during chronic injury leads to fibroblast activation via the aggregation of growth factor receptor clusters (such as TGF-), thereby amplifying profibrotic signal cascades.  xxxxxxxxxxxxxxxxxxxxxxxx  This visit for Subject Stephen [name] DOB: 6/07-Mar-1952n 12/22/2019 for the above protocol is Visit/Encounter # Baseline/randomization visit  and is for purpose of researcg . Subject/LAR expressed continued interest and consent in continuing as a study subject. Subject thanked for participation in research and contribution to science.    S: This visit is for randomization.  He is consented already.  At this visit he expressed desire to go through with the study.  His wife is here with him and she also expressed interest to proceed with the study.  He has  finished his screening procedures.  It is to be noted that post consent during screening when he did have his first pulmonary function test he had significant cough and the test was not acceptable.  Because of his chronic cough at that point in time initiated standard of care antitussive therapy.  After this the cough improved we did a repeat of the spirometry and DLCO.  The sponsors approved it.  I reviewed the inclusion exclusion criteria and he meets all inclusion.  He does not have any exclusion.  I signed the inclusion exclusion criteria.  Then proceed with physical exam.  He will have his labs EKG spirometry 6-minute walk test today.  After all that he will proceed with randomization  In terms of his approved therapy he is on pirfenidone.  He is tolerating it well.  He says he has no side effects.  He says it so good that he does not know that he is even taking it.  However he said 3 weeks ago he did have some itching on his neck and hands bilaterally [pruritus] shortly after consuming seafood.  Then again last night he had flounder and shrimp and this morning and last night he had itching in his neck and hands.  There is no rash.  He does not go out any is not wearing sunscreen.  He has had both his Covid vaccines.  The itching is getting better at this point.    Physical exam  -Done and documented in the source document    ICD-10-CM   1. Research study patient  Z00.6   2. IPF (idiopathic pulmonary fibrosis) ([place] J84.112   3. Pruritus  L29.9  Patient Instructions  Research study patient IPF (idiopathic pulmonary fibrosis) ([name])  -Clinically stable -Continue Esbriet/pirfenidone as before -Proceed with randomization visit today for research  Pruritus  -Mild and 2 episodes -Related to seafood and not related to study drug but possibly related to pirfenidone  Plan  -Ensure applying sunscreen when you go out especially when taking Esbriet -List seafood  allergy   Follow-up  -Per study protocol   PS:  Between 1:19 PM and 1:25 PM -I observed him doing his first dose of inhaler therapy.  The first capsule was done correctly with a 4 count of 5 he was able to hold his breath.  He also did 2 additional inhalations with the first capsule.  This produced cough after the second inhalation.  He drank water and then did the third inhalation.  The third inhalation went well.  He then did a second capsule as well and he was able to hold his breath for at least a count of 5.   SIGNATURE    Dr. Brand Males, M.D., F.C.C.P, ACRP-CPI Pulmonary and Critical Care Medicine Research Investigator, [facility] [place] Staff Physician, [place] Director - Interstitial Lung Disease  Program  Pulmonary [place] [facility] @ [place], [place], [address]  Pager: [phone], If no answer or between  15:00h - 7:00h: call [phone] Telephone: [phone]  10:09 AM 12/22/2019

## 2019-12-22 NOTE — Research (Signed)
Title: GALACTIC-1 [facility]) is a Phase 2b, randomized, double-blind, parallel, placebo-controlled multicenter international study to evaluate evaluate the efficacy and safety of two doses (placebo v 11m v 18m of TD139 administered once a day for 52 weeks as compared to placebo in subjects with Idiopathic Pulmonary Fibrosis. Key Primary End Point: Annual rate of decline in FVC expressed in mL over 52 weeks  Protocol #: GALACTIC-1, Clinical Trials #: [id number]: [email or web address] ([employer] Protocol Version for 12/22/2019 is v.5.8 Informed Consent Version for 12/22/2019 is IRB v.4.0  Informed Consent Addendum as of 12/22/2019 is v.1.0 Investigator Brochure Version for 12/22/2019 is v.10.0   Key Features of TD139 the study drug: a Galectin-3 inhibitor designed specifically to modulate the fibrogenic response to tissue injury. In multiple models of organ fibrosis, it has been demonstrated that Gal-3 is potently pro-fibrotic, modulating the activity of fibroblasts and macrophages in chronically injured tissues. It is hypothesized that increased Gal-3 expression during chronic injury leads to fibroblast activation via the aggregation of growth factor receptor clusters (such as TGF-), thereby amplifying profibrotic signal cascades.  Key Inclusion Criteria:  Age ? 4055 Y Diagnosis of IPF established during the previous 3 years An historical diagnostic HRCT scan within the 12 months prior to screening. Note: a separate HRCT scan will not be performed as part of the study.Y Any existing SoC treatment must be deemed as stable by the PI/treating physician before randomization into the study. a. FVC > 45% of the predicted Y b. DLCO (corrected for Hb) of 30% to 79% of the predicted value at screening Y Women of childbearing potential agree to use highly effective birth control methods during the study N/A  Key Exclusion Criteria FEV1/FVC </= 0.7 N Has a history of malignancy within  the last 2 years with the exception of basal cell carcinoma, chronic lymphocytic leukaemia and prostate cancer requiring androgens, localised treatment and/or managed by observation. N Is likely to receive lung transplantation within the next 12 months N Currently receiving investigational therapy for IPF or administration of such therapeutics within 4 weeks of initial screening (or 5 half-lives, whichever is longer) N Currently receiving high dose corticosteroids, cytotoxic, and or vasodilator therapy for pulmonary hypertension or administration of such therapeutics within 4 weeks of initial screening (or 5 half-lives, whichever is longer). A current dose of less than or equal to 15 mg/day of prednisone or its equivalent is acceptable if the dose is anticipated to remain stable during the study. Short term use (<1 month) of higher dose corticosteroid treatment is also permitted. N Has a history of unstable or deteriorating cardiac or pulmonary disease (other than IPF) within the previous six months N Has clinical evidence of active infection N   Half-life: 3.5-8 hours For the 3 mg dose, the t1/2 is 3.5 hours. For 10 mg dose, the t1/2 is closer to 8 hours  Interactions No clinically significant CYP interactions  Safety Data Version 3.0. 08 Oct 2017  Animal studies: - No effect on central nervous system - No effect on blood pressure, heart rate or mean arterial pressure - No effect on QTc - No effect on respiratory system - No mention of renal/hepatic effect  Phase I studies: Conducted in 36 healthy patients, 41% reported treatment emergent adverse events (TEAE); all were mild in nature. The most frequently reported TEAE was dysgeusia. This occurred in 36% of patients and was transient. 8% of patients reported cough. 2 patients (5%) were diagnosed with an upper respiratory tract infection during  the study, and  required treatment with antibiotics. 1 patient (2%) experienced a serious adverse  event; pneumonia, this was fatal. Little systemic effect seen thus far.  Clinical Research Coordinator / Research RN note : This visit for Subject SARIM ROTHMAN with DOB: Nov 18, 1950 on 12/22/2019 for the above protocol this is Visit 2, baseline/randomization visit and is for purpose of research. The consent for this encounter is under Protocol Version 5.8 and is currently IRB approved. The subject, Stephen [name], expressed continued interest and consent in continuing as a study subject. Subject confirmed that there was no change in contact information (e.g. address, telephone, email). Subject thanked for participation in research and contribution to science.   In this visit 12/22/2019 the subject will be evaluated by investigator named, Dr. Brand Males . This research coordinator has verified that the investigator is uptodate with his/her training logs. The PI confirmed Inclusion/ Exclusion criteria for patient and concluded that he met all criteria required per protocol and subject could proceed with being randomized by research staff.  Additional details:  Because this visit is a key visit for baseline/randomizationfor the above mentioned protocol this visit is being conducted under direct supervision of the PI Dr. Brand Males. This PI is is available for this visit   During this visit the patient stated that he received both doses of his [place] COVID-19 as of 06 Dec 2019. He tolerated the vaccine well.  The patient previously had an ear infection at his last visit. Since his last visit this AE has been resolved after the conclusion of his medication on, 18 Dec 2019.  During PI exam, patient stated to PI that for the past month he has had 2 episodes of pruritus following his intake of seafood. The first episode was in the beginning of the month, 01 Dec 2019. The second episode was yesterday, 22 Dec 2019. Both episodes occurred after eating fish. Patient was instructed by PI to avoid eating  fish.  The patient was administered his first dose of IP drug on site at 13:19. The subject was observed for 1 hour and left site at 14:19. Subject experienced no adverse side effects from IP drug on site.  Signed by  Barrie Folk Clinical Research Coordinator I [facility]  [place], [place] 14:20 12/22/2019

## 2019-12-23 ENCOUNTER — Encounter (HOSPITAL_COMMUNITY)
Admission: RE | Admit: 2019-12-23 | Discharge: 2019-12-23 | Disposition: A | Discharge status: Home / Self Care | Payer: PPO | Source: Ambulatory Visit | Attending: Internal Medicine | Admitting: Internal Medicine

## 2019-12-23 VITALS — Wt 242.1 lb

## 2019-12-23 DIAGNOSIS — J84112 Idiopathic pulmonary fibrosis: Secondary | ICD-10-CM

## 2019-12-23 NOTE — Progress Notes (Signed)
Daily Session Note  Patient Details  Name: Stephen [name] MRN: [id number] Date of Birth: 1951/06/22 Referring Provider:     Pulmonary Rehab Walk Test from 09/11/2019 in [place]  Referring Provider  Dr. Chase Caller      Encounter Date: 12/23/2019  Check In: Session Check In - 12/23/19 1325      Check-In   Supervising physician immediately available to respond to emergencies  [facility] immediately available    Physician(s)  Dr. Cyndia Skeeters    Location  [facility]    Staff Present  Rosebud Poles, RN, Bjorn Loser, MS, Exercise Physiologist;Lisa Ysidro Evert, RN    Virtual Visit  No    Medication changes reported      No    Fall or balance concerns reported     No    Tobacco Cessation  No Change    Warm-up and Cool-down  Performed as group-led instruction    Resistance Training Performed  Yes    VAD Patient?  No    PAD/SET Patient?  No      Pain Assessment   Currently in Pain?  No/denies    Multiple Pain Sites  No       Capillary Blood Glucose: No results found for this or any previous visit (from the past 24 hour(s)).  Exercise Prescription Changes - 12/23/19 1400      Response to Exercise   Blood Pressure (Admit)  160/70    Blood Pressure (Exercise)  148/78    Blood Pressure (Exit)  138/84    Heart Rate (Admit)  84 bpm    Heart Rate (Exercise)  91 bpm    Heart Rate (Exit)  82 bpm    Oxygen Saturation (Admit)  97 %    Oxygen Saturation (Exercise)  93 %    Oxygen Saturation (Exit)  97 %    Rating of Perceived Exertion (Exercise)  11    Perceived Dyspnea (Exercise)  1    Duration  Continue with 30 min of aerobic exercise without signs/symptoms of physical distress.    Intensity  THRR unchanged      Progression   Progression  Continue to progress workloads to maintain intensity without signs/symptoms of physical distress.      Resistance Training   Training Prescription  Yes    Weight  blue bands    Reps  10-15     Time  10 Minutes      Oxygen   Oxygen  Continuous    Liters  2      NuStep   Level  3    SPM  80    Minutes  15    METs  1.6      Arm Ergometer   Level  2    Minutes  15       Social History   Tobacco Use  Smoking Status Never Smoker  Smokeless Tobacco Never Used    Goals Met:  Independence with exercise equipment Exercise tolerated well Strength training completed today  Goals Unmet:  Not Applicable  Comments: Service time is from 1515 to 21    Dr. Fransico Him is Medical Director for Cardiac Rehab at [facility].

## 2019-12-25 ENCOUNTER — Encounter (HOSPITAL_COMMUNITY)
Admission: RE | Admit: 2019-12-25 | Discharge: 2019-12-25 | Disposition: A | Discharge status: Home / Self Care | Payer: PPO | Source: Ambulatory Visit | Attending: Internal Medicine | Admitting: Internal Medicine

## 2019-12-25 ENCOUNTER — Other Ambulatory Visit: Payer: Self-pay

## 2019-12-25 DIAGNOSIS — J84112 Idiopathic pulmonary fibrosis: Secondary | ICD-10-CM | POA: Diagnosis not present

## 2019-12-25 NOTE — Progress Notes (Addendum)
Daily Session Note  Patient Details  Name: Stephen [name] MRN: [id number] Date of Birth: 1951-03-03 Referring Provider:     Pulmonary Rehab Walk Test from 09/11/2019 in [place]  Referring Provider  Dr. Chase Caller      Encounter Date: 12/25/2019  Check In: Session Check In - 12/25/19 1419      Check-In   Supervising physician immediately available to respond to emergencies  [facility] immediately available    Physician(s)  Dr. Cyndia Skeeters    Location  [facility]    Staff Present  Rosebud Poles, RN, Bjorn Loser, MS, Exercise Physiologist;Rand Boller Ysidro Evert, RN    Virtual Visit  No    Medication changes reported      No    Fall or balance concerns reported     No    Tobacco Cessation  No Change    Warm-up and Cool-down  Performed as group-led instruction    Resistance Training Performed  Yes    VAD Patient?  No    PAD/SET Patient?  No      Pain Assessment   Currently in Pain?  No/denies    Multiple Pain Sites  No       Capillary Blood Glucose: No results found for this or any previous visit (from the past 24 hour(s)).    Social History   Tobacco Use  Smoking Status Never Smoker  Smokeless Tobacco Never Used    Goals Met:  Exercise tolerated well No report of cardiac concerns or symptoms Strength training completed today  Goals Unmet:  Not Applicable  Comments: Service time is from 1330 to 34    Dr. Fransico Him is Medical Director for Cardiac Rehab at [facility].

## 2019-12-28 DIAGNOSIS — G4733 Obstructive sleep apnea (adult) (pediatric): Secondary | ICD-10-CM | POA: Diagnosis not present

## 2019-12-28 DIAGNOSIS — I10 Essential (primary) hypertension: Secondary | ICD-10-CM | POA: Diagnosis not present

## 2019-12-30 ENCOUNTER — Other Ambulatory Visit: Payer: Self-pay

## 2019-12-30 ENCOUNTER — Encounter (HOSPITAL_COMMUNITY)
Admission: RE | Admit: 2019-12-30 | Discharge: 2019-12-30 | Disposition: A | Discharge status: Home / Self Care | Payer: PPO | Source: Ambulatory Visit | Attending: Internal Medicine | Admitting: Internal Medicine

## 2019-12-30 DIAGNOSIS — J84112 Idiopathic pulmonary fibrosis: Secondary | ICD-10-CM | POA: Insufficient documentation

## 2019-12-30 NOTE — Progress Notes (Signed)
Pulmonary Rehab Nutrition Note  Spoke with pt during exercise. We discussed his fasting CBGs. He checks 2-3x/week with range of 95-97 mg/dl. He has lost ~7 lbs since he started rehab. He reports getting more O2 tanks delivered this week which will allow him to exercise at home. He has not made many diet changes recently. He says he is interested in weight loss. Attempted to set pt centered goals. He reports snacking on cookies and crackers, occasionally oranges. We discussed eating more fruit as snacks and choosing crackers and cookies less often. We discussed adequate portion sizes and ways to limit at his largest meal. Will continue to monitor.  Michaele Offer, MS, RDN, LDN

## 2019-12-30 NOTE — Progress Notes (Signed)
Daily Session Note  Patient Details  Name: TREVEON BOURCIER MRN: [id number] Date of Birth: 1951/01/17 Referring Provider:     Pulmonary Rehab Walk Test from 09/11/2019 in [place]  Referring Provider  Dr. Chase Caller      Encounter Date: 12/30/2019  Check In: Session Check In - 12/30/19 1536      Check-In   Supervising physician immediately available to respond to emergencies  [facility] immediately available    Physician(s)  Dr. Doristine Bosworth    Location  [facility]    Staff Present  Rosebud Poles, RN, Bjorn Loser, MS, Exercise Physiologist;Olinty Celesta Aver, MS, ACSM CEP, Exercise Physiologist    Virtual Visit  No    Medication changes reported      No    Fall or balance concerns reported     No    Tobacco Cessation  No Change    Warm-up and Cool-down  Performed on first and last piece of equipment    Resistance Training Performed  Yes    VAD Patient?  No    PAD/SET Patient?  No      Pain Assessment   Currently in Pain?  No/denies    Multiple Pain Sites  No       Capillary Blood Glucose: No results found for this or any previous visit (from the past 24 hour(s)).    Social History   Tobacco Use  Smoking Status Never Smoker  Smokeless Tobacco Never Used    Goals Met:  Independence with exercise equipment Exercise tolerated well Strength training completed today  Goals Unmet:  Not Applicable  Comments: Service time is from 1325 to 1432    Dr. Fransico Him is Medical Director for Cardiac Rehab at [facility].

## 2020-01-01 ENCOUNTER — Other Ambulatory Visit: Payer: Self-pay

## 2020-01-01 ENCOUNTER — Encounter (HOSPITAL_COMMUNITY)
Admission: RE | Admit: 2020-01-01 | Discharge: 2020-01-01 | Disposition: A | Discharge status: Home / Self Care | Payer: PPO | Source: Ambulatory Visit | Attending: Internal Medicine | Admitting: Internal Medicine

## 2020-01-01 DIAGNOSIS — J84112 Idiopathic pulmonary fibrosis: Secondary | ICD-10-CM

## 2020-01-01 NOTE — Progress Notes (Signed)
Daily Session Note  Patient Details  Name: Stephen [name] MRN: [id number] Date of Birth: Aug 25, 1951 Referring Provider:     Pulmonary Rehab Walk Test from 09/11/2019 in [place]  Referring Provider  Dr. Chase Caller      Encounter Date: 01/01/2020  Check In: Session Check In - 01/01/20 1406      Check-In   Supervising physician immediately available to respond to emergencies  [facility] immediately available    Physician(s)  Dr. Broadus John    Location  [facility]    Staff Present  Rosebud Poles, RN, Bjorn Loser, MS, Exercise Physiologist    Virtual Visit  No    Medication changes reported      No    Fall or balance concerns reported     No    Tobacco Cessation  No Change    Warm-up and Cool-down  Performed as group-led instruction    Resistance Training Performed  Yes    VAD Patient?  No    PAD/SET Patient?  No      Pain Assessment   Currently in Pain?  No/denies    Multiple Pain Sites  No       Capillary Blood Glucose: No results found for this or any previous visit (from the past 24 hour(s)).    Social History   Tobacco Use  Smoking Status Never Smoker  Smokeless Tobacco Never Used    Goals Met:  Independence with exercise equipment Exercise tolerated well Strength training completed today  Goals Unmet:  Not Applicable  Comments: Service time is from 1345 to 1453    Dr. Rush Farmer is Medical Director for Pulmonary Rehab at [facility].

## 2020-01-06 ENCOUNTER — Encounter (HOSPITAL_COMMUNITY)
Admission: RE | Admit: 2020-01-06 | Discharge: 2020-01-06 | Disposition: A | Discharge status: Home / Self Care | Payer: PPO | Source: Ambulatory Visit | Attending: Internal Medicine | Admitting: Internal Medicine

## 2020-01-06 ENCOUNTER — Other Ambulatory Visit: Payer: Self-pay

## 2020-01-06 VITALS — Wt 240.7 lb

## 2020-01-06 DIAGNOSIS — J84112 Idiopathic pulmonary fibrosis: Secondary | ICD-10-CM | POA: Diagnosis not present

## 2020-01-06 NOTE — Progress Notes (Signed)
Daily Session Note  Patient Details  Name: Stephen [name] MRN: [id number] Date of Birth: 03-Dec-1950 Referring Provider:     Pulmonary Rehab Walk Test from 09/11/2019 in [place]  Referring Provider  Dr. Chase Caller      Encounter Date: 01/06/2020  Check In: Session Check In - 01/06/20 1352      Check-In   Supervising physician immediately available to respond to emergencies  [facility] immediately available    Physician(s)  Dr. Broadus John    Location  [facility]    Staff Present  Rosebud Poles, RN, Bjorn Loser, MS, Exercise Physiologist;Lisa Ysidro Evert, RN    Virtual Visit  No    Medication changes reported      No    Fall or balance concerns reported     No    Tobacco Cessation  No Change    Warm-up and Cool-down  Performed as group-led instruction    Resistance Training Performed  Yes    VAD Patient?  No    PAD/SET Patient?  No      Pain Assessment   Currently in Pain?  No/denies    Multiple Pain Sites  No       Capillary Blood Glucose: No results found for this or any previous visit (from the past 24 hour(s)).    Social History   Tobacco Use  Smoking Status Never Smoker  Smokeless Tobacco Never Used    Goals Met:  Independence with exercise equipment Exercise tolerated well Strength training completed today  Goals Unmet:  Not Applicable  Comments: Service time is from 1328 to 1440    Dr. Fransico Him is Medical Director for Cardiac Rehab at [facility].

## 2020-01-08 ENCOUNTER — Other Ambulatory Visit: Payer: Self-pay

## 2020-01-08 ENCOUNTER — Encounter (HOSPITAL_COMMUNITY)
Admission: RE | Admit: 2020-01-08 | Discharge: 2020-01-08 | Disposition: A | Discharge status: Home / Self Care | Payer: PPO | Source: Ambulatory Visit | Attending: Internal Medicine | Admitting: Internal Medicine

## 2020-01-08 DIAGNOSIS — J84112 Idiopathic pulmonary fibrosis: Secondary | ICD-10-CM | POA: Diagnosis not present

## 2020-01-08 NOTE — Progress Notes (Signed)
Daily Session Note  Patient Details  Name: Stephen [name] MRN: [id number] Date of Birth: August 02, 1951 Referring Provider:     Pulmonary Rehab Walk Test from 09/11/2019 in [place]  Referring Provider  Dr. Chase Caller      Encounter Date: 01/08/2020  Check In: Session Check In - 01/08/20 1343      Check-In   Supervising physician immediately available to respond to emergencies  [facility] immediately available    Physician(s)  Dr. Maylene Roes    Location  [facility]    Staff Present  Rosebud Poles, RN, Bjorn Loser, MS, Exercise Physiologist;Lisa Ysidro Evert, RN    Virtual Visit  No    Medication changes reported      No    Fall or balance concerns reported     No    Tobacco Cessation  No Change    Warm-up and Cool-down  Performed as group-led instruction    Resistance Training Performed  Yes    VAD Patient?  No    PAD/SET Patient?  No      Pain Assessment   Currently in Pain?  No/denies    Multiple Pain Sites  No       Capillary Blood Glucose: No results found for this or any previous visit (from the past 24 hour(s)).    Social History   Tobacco Use  Smoking Status Never Smoker  Smokeless Tobacco Never Used    Goals Met:  Independence with exercise equipment Exercise tolerated well Strength training completed today  Goals Unmet:  Not Applicable  Comments: Service time is from 1330 to 1437    Dr. Fransico Him is Medical Director for Cardiac Rehab at [facility].

## 2020-01-12 ENCOUNTER — Telehealth: Payer: Self-pay | Admitting: Internal Medicine

## 2020-01-12 NOTE — Telephone Encounter (Signed)
I spoke to the patient at 11:06 AM and told him we received communication today from the [facility] sponsor in regards to  [place] study in which the patient is a current participant in. Sponsor instructed study staff to immediately notify patients in the following group to stop study drug immediately:  All participants in the [place] stratum, as defined in the protocol, i.e. participants who were treated with nintedanib or pirfenidone at the time of randomization  Participants in the [place] stratum, who are currently being treated with either nintedanib or pirfenidone  Participants receiving 10 mg [id number]   The patient was instructed to discontinue study medication immediately due to him fitting the criteria of a patient who is currently being treated with pirfenidone. He stated that he already took his daily dose at 9:00 AM today, March 15, but he understands that no other doses shall be taken of the drug. The patient, Mr. Stephen [name], verbalized understanding and agreed to stop taking the investigational product and to keep all study related materials to return to [place]. I advised him that we would be in touch with him to schedule an end of treatment visit as soon as possible and it would possibly be on his visit 3 date that was already scheduled for next week on March 24.No further action needed at this time.  [place] Coordinator I

## 2020-01-13 ENCOUNTER — Other Ambulatory Visit: Payer: Self-pay

## 2020-01-13 ENCOUNTER — Encounter (HOSPITAL_COMMUNITY)
Admission: RE | Admit: 2020-01-13 | Discharge: 2020-01-13 | Disposition: A | Discharge status: Home / Self Care | Payer: PPO | Source: Ambulatory Visit | Attending: Internal Medicine | Admitting: Internal Medicine

## 2020-01-13 DIAGNOSIS — J84112 Idiopathic pulmonary fibrosis: Secondary | ICD-10-CM

## 2020-01-13 NOTE — Telephone Encounter (Signed)
PI OVERSIGHT ATTESTATION  I the Principal Investigator (PI) for the above mentioned study attest that I reviewed the mentioned  clinical research coordinator  notes on research subject  Stephen [name]  born [birth year] . I  agree with the findings mentioned above. The instructions took place same day as notice from sponsor under my delegation   Dr. Brand Males, M.D., F.C.C.P., ACRP-CPI Pulmonary and Critical Care Medicine Principal Investigator & Staff Physician [facility] [place] [facility] [place] [facility] Pager: [phone], If no answer or between  15:00h - 7:00h: call [phone]  01/13/2020 3:02 PM

## 2020-01-13 NOTE — Progress Notes (Signed)
Daily Session Note  Patient Details  Name: Stephen [name] MRN: [id number] Date of Birth: May 23, 1951 Referring Provider:     Pulmonary Rehab Walk Test from 09/11/2019 in [place]  Referring Provider  Dr. Chase Caller      Encounter Date: 01/13/2020  Check In: Session Check In - 01/13/20 1341      Check-In   Supervising physician immediately available to respond to emergencies  [facility] immediately available    Physician(s)  Dr. Dessa Phi    Location  [facility]    Staff Present  Rosebud Poles, RN, Bjorn Loser, MS, Exercise Physiologist;Allice Garro Ysidro Evert, RN    Virtual Visit  No    Medication changes reported      No    Fall or balance concerns reported     No    Tobacco Cessation  No Change    Warm-up and Cool-down  Performed as group-led instruction    Resistance Training Performed  Yes    VAD Patient?  No    PAD/SET Patient?  No      Pain Assessment   Currently in Pain?  No/denies    Multiple Pain Sites  No       Capillary Blood Glucose: No results found for this or any previous visit (from the past 24 hour(s)).    Social History   Tobacco Use  Smoking Status Never Smoker  Smokeless Tobacco Never Used    Goals Met:  Exercise tolerated well No report of cardiac concerns or symptoms Strength training completed today  Goals Unmet:  Not Applicable  Comments: Service time is from 1320 to 1430    Dr. Fransico Him is Medical Director for Cardiac Rehab at [facility].

## 2020-01-15 ENCOUNTER — Encounter (HOSPITAL_COMMUNITY)
Admission: RE | Admit: 2020-01-15 | Discharge: 2020-01-15 | Disposition: A | Discharge status: Home / Self Care | Payer: PPO | Source: Ambulatory Visit | Attending: Internal Medicine | Admitting: Internal Medicine

## 2020-01-15 ENCOUNTER — Other Ambulatory Visit: Payer: Self-pay

## 2020-01-15 DIAGNOSIS — J84112 Idiopathic pulmonary fibrosis: Secondary | ICD-10-CM

## 2020-01-15 NOTE — Progress Notes (Signed)
Daily Session Note  Patient Details  Name: Stephen [name] MRN: [id number] Date of Birth: 1951-09-02 Referring Provider:     Pulmonary Rehab Walk Test from 09/11/2019 in [place]  Referring Provider  Dr. Chase Caller      Encounter Date: 01/15/2020  Check In: Session Check In - 01/15/20 [place]      Check-In   Supervising physician immediately available to respond to emergencies  [facility] immediately available    Physician(s)  Dr. Darrick Meigs    Location  [facility]    Staff Present  Rosebud Poles, RN, Bjorn Loser, MS, Exercise Physiologist;Acy Orsak Ysidro Evert, RN    Virtual Visit  No    Medication changes reported      No    Fall or balance concerns reported     No    Tobacco Cessation  No Change    Warm-up and Cool-down  Performed as group-led instruction    Resistance Training Performed  Yes    VAD Patient?  No      Pain Assessment   Currently in Pain?  No/denies    Multiple Pain Sites  No       Capillary Blood Glucose: No results found for this or any previous visit (from the past 24 hour(s)).    Social History   Tobacco Use  Smoking Status Never Smoker  Smokeless Tobacco Never Used    Goals Met:  Exercise tolerated well No report of cardiac concerns or symptoms Strength training completed today  Goals Unmet:  Not Applicable  Comments: Service time is from 1320 to 24    Dr. Fransico Him is Medical Director for Cardiac Rehab at [facility]

## 2020-01-17 DIAGNOSIS — J84112 Idiopathic pulmonary fibrosis: Secondary | ICD-10-CM | POA: Diagnosis not present

## 2020-01-19 ENCOUNTER — Encounter: Payer: PPO | Admitting: Adult Health

## 2020-01-19 DIAGNOSIS — R338 Other retention of urine: Secondary | ICD-10-CM | POA: Diagnosis not present

## 2020-01-20 ENCOUNTER — Encounter (HOSPITAL_COMMUNITY): Payer: PPO

## 2020-01-21 ENCOUNTER — Encounter: Payer: Self-pay | Admitting: Adult Health

## 2020-01-21 ENCOUNTER — Other Ambulatory Visit: Payer: Self-pay

## 2020-01-21 ENCOUNTER — Encounter: Payer: PPO | Admitting: *Deleted

## 2020-01-21 ENCOUNTER — Encounter (INDEPENDENT_AMBULATORY_CARE_PROVIDER_SITE_OTHER): Payer: PPO | Admitting: Adult Health

## 2020-01-21 VITALS — BP 134/72 | HR 30 | Temp 98.4°F | Resp 22 | Ht 66.34 in | Wt 241.6 lb

## 2020-01-21 DIAGNOSIS — Z006 Encounter for examination for normal comparison and control in clinical research program: Secondary | ICD-10-CM

## 2020-01-21 DIAGNOSIS — J84112 Idiopathic pulmonary fibrosis: Secondary | ICD-10-CM

## 2020-01-21 NOTE — Progress Notes (Signed)
_0  ID: Stephen [name], male    DOB: May 04, 1951, 69 y.o.   MRN: [id number]  Chief Complaint  Patient presents with  . Follow-up    Research visit    Referring provider: Ronita Hipps, MD  HPI: Title: [facility] ([place]) is a Phase 2b, randomized, double-blind, parallel, placebo-controlled multicenter international study to evaluate evaluate the efficacy and safety of two doses (placebo v 53m v 158m of TD139 administered once a day for 52 weeks as compared to placebo in subjects with Idiopathic Pulmonary Fibrosis. Key Primary End Point: Annual rate of decline in FVC expressed in mL over 52 weeks  Protocol #: GALACTIC-1, Clinical Trials #: [id number]: [email or web address] ([place] [employer] Protocol Version V5.8 US_dated [id number] Version 9.0 dated [id number]: Consent Form Addendum (IRB approved Apr 24, 2019)  Main-: IRB version 4.0 dated 25Sep2020; Optional Pharmacogenetic testing: IRB version 2.0 dated Oct 02, 2018  Key Features of TD139 the study drug: a Galectin-3 inhibitor designed specifically to modulate the fibrogenic response to tissue injury. In multiple models of organ fibrosis, it has been demonstrated that Gal-3 is potently pro-fibrotic, modulating the activity of fibroblasts and macrophages in chronically injured tissues. It is hypothesized that increased Gal-3 expression during chronic injury leads to fibroblast activation via the aggregation of growth factor receptor clusters (such as TGF-), thereby amplifying profibrotic signal cascades.   Following a review by the study's independent Da[employer] and instrumentation engineerDSMB), the DSMB informed [employer], based on unblinded safety and efficacy data that there was an imbalance in the serious adverse experiences across the study groups but not an imbalance between the groups in mortality. [employer] expects to continue recruiting patients who are not taking nintedanib or pirfenidone at screening and who  would be randomized to receive 3 mg GB0139 or placebo.  The DSMB recommended the patients randomized to the 10 mg GB0139 group and all those taking nintedanib or pirfenidone should be discontinued from the study.  Even though none of the serious adverse events described above were attributed to the investigational drug by investigators, it has been decided to act with caution. Accordingly, the following measures will be implemented: To continue the GALACTIC-1 study with modifications, including: . To discontinue dosing of GB0139 dry powder for inhalation in the study participant categories below: o All participants in the S[place], as defined in the protocol, i.e. participants who were treated with nintedanib or pirfenidone at the time of randomization o Participants in the SOAlgomatratum, who are currently being treated with either nintedanib or pirfenidone o Participants receiving 10 mg GB0139   . To continue the GALACTIC-1 study in those participants in the SOFruitporttratum who are currently not being treated with either nintedanib or pirfenidone . Upon receipt of approvals from relevant competent authorities and IRB, to re-commence screening of new patients, who are currently not being treated with nintedanib or pirfenidone, and to randomize eligible patients to receive 3 mg GB0139 or placebo . To define nintedanib and pirfenidone as study prohibited medications in a protocol amendment [employer] will continue the GALACTIC-1 study with 3 mg GB0139 vs. placebo and continue to evaluate the effects of GB0139 in participants who are not being treated with or have not been able to tolerate nintedanib or pirfenidone.   01/21/2020 Research Visit : Withdrawal visit .  As above the purpose of today research visit is for Withdrawal visit /Final visit.  As above DSM being has recommended patients randomized to the 10 mg GB 0139 group  and all those taking nintedanib or pirfenidone should be discontinued from study.   Patient is aware of recommendations and has stopped study drug.  He denies any known symptoms or side effects.  Patient says his underlying breathing is stable.  He denies any flare of cough or wheezing.  He remains on oxygen with no increased oxygen demands.  Research exit physical exam was completed per protocol.  Patient does have a chronic rash along his hands that has been present for greater than 2 months.  I did recommend referral to dermatology.      Allergies  Allergen Reactions  . Ciprofloxacin Other (See Comments)    colitis  . Codeine   . Other     Seafood allergy    Immunization History  Administered Date(s) Administered  . Fluad Quad(high Dose 65+) 06/30/2019  . Influenza, High Dose Seasonal PF 08/28/2017, 07/19/2018  . Pneumococcal Conjugate-13 08/25/2016  . Pneumococcal Polysaccharide-23 11/18/2007    Past Medical History:  Diagnosis Date  . Allergy   . Arthritis   . GERD (gastroesophageal reflux disease)   . Heart murmur   . Hiatal hernia   . Hyperlipidemia   . Hypertension   . Sleep apnea   . Vertigo     Tobacco History: Social History   Tobacco Use  Smoking Status Never Smoker  Smokeless Tobacco Never Used   Counseling given: Not Answered   Outpatient Medications Prior to Visit  Medication Sig Dispense Refill  . amitriptyline (ELAVIL) 75 MG tablet Take 75 mg by mouth at bedtime.    Marland Kitchen amLODipine (NORVASC) 10 MG tablet Take 10 mg by mouth daily.    Marland Kitchen aspirin EC 81 MG tablet Take 81 mg by mouth daily.    . calcium carbonate (OSCAL) 1500 (600 Ca) MG TABS tablet Take by mouth 2 (two) times daily with a meal.    . calcium carbonate (TUMS - DOSED IN MG ELEMENTAL CALCIUM) 500 MG chewable tablet Chew 1 tablet by mouth daily.    . cholecalciferol (VITAMIN D) 1000 units tablet Take 1,000 Units by mouth daily.    . cloNIDine (CATAPRES) 0.3 MG tablet Take 0.3 mg by mouth daily after supper.     . dicyclomine (BENTYL) 10 MG capsule Take 10 mg by mouth 3  (three) times daily before meals.     Marland Kitchen HYDROcodone-homatropine (HYCODAN) 5-1.5 MG/5ML syrup Take 5 mLs by mouth 3 (three) times daily as needed for cough. 450 mL 0  . lansoprazole (PREVACID) 30 MG capsule Take 30 mg by mouth daily at 12 noon.    . Methylcellulose, Laxative, (CITRUCEL PO) Take 1 Scoop by mouth.    . metoprolol tartrate (LOPRESSOR) 50 MG tablet Take 1 tablet by mouth 2 (two) times daily.    . montelukast (SINGULAIR) 10 MG tablet Take 10 mg by mouth at bedtime.    . Multiple Vitamin (MULTIVITAMIN WITH MINERALS) TABS tablet Take 1 tablet by mouth daily.    . nabumetone (RELAFEN) 750 MG tablet Take 750 mg by mouth 2 (two) times daily.    Marland Kitchen olmesartan (BENICAR) 20 MG tablet Take 1 tablet by mouth daily.    . Pirfenidone (ESBRIET) 267 MG CAPS Take 801 mg by mouth 3 (three) times daily. Takes 3 caps three times daily     . Pirfenidone (ESBRIET) 801 MG TABS Take 1 tablet by mouth 3 (three) times daily. 270 tablet 0  . polyethylene glycol (MIRALAX / GLYCOLAX) packet Take 17 g by mouth daily.    Marland Kitchen  Probiotic Product (PROBIOTIC DAILY PO) Take 1 capsule by mouth daily.    . rosuvastatin (CRESTOR) 10 MG tablet Take 10 mg by mouth daily.    . sodium chloride 1 g tablet Take 3 g by mouth 3 (three) times daily.     Marland Kitchen spironolactone (ALDACTONE) 25 MG tablet Take 25 mg by mouth 3 (three) times a week.    Marland Kitchen UNABLE TO FIND Take 1 capsule by mouth 3 (three) times daily. Med Name: blood sugar harmony    . UNABLE TO FIND Take 1 capsule by mouth 3 (three) times daily. Med Name: Clear lungs    . vitamin B-12 (CYANOCOBALAMIN) 1000 MCG tablet Take 1,000 mcg by mouth daily.    . vitamin E 400 UNIT capsule Take 400 Units by mouth daily.     No facility-administered medications prior to visit.    BMET BNP No results found for: BNP  ProBNP No results found for: PROBNP  Imaging: No results found.    PFT Results Latest Ref Rng & Units 08/27/2019 10/03/2018 08/16/2018 04/11/2018  FVC-Pre L 2.18 2.42  2.53 2.57  FVC-Predicted Pre % 56 65 67 63  Pre FEV1/FVC % % 82 91 89 89  FEV1-Pre L 1.78 2.22 2.26 2.29  FEV1-Predicted Pre % 62 80 81 76  DLCO UNC% % 59 - - 58  DLCO COR %Predicted % 97 - - 106    No results found for: NITRICOXIDE   PE: Completed per research protocol   Assessment & Plan:   Idiopathic pulmonary fibrosis ([place]) Appears to be stable.  Patient continue on current regimen  Research study patient Withdrawal research visit completed.  Patient is off study drug review board recommendations. Exit physical exam completed per protocol.  Research protocol completed     Rexene Edison, NP 01/21/2020

## 2020-01-21 NOTE — Assessment & Plan Note (Signed)
Withdrawal research visit completed.  Patient is off study drug review board recommendations. Exit physical exam completed per protocol.  Research protocol completed

## 2020-01-21 NOTE — Assessment & Plan Note (Signed)
Appears to be stable.  Patient continue on current regimen

## 2020-01-22 ENCOUNTER — Encounter (HOSPITAL_COMMUNITY): Payer: PPO

## 2020-01-22 NOTE — Research (Signed)
Title: GALACTIC-1 [employer]) is a Phase 2b, randomized, double-blind, parallel, placebo-controlled multicenter international study to evaluate evaluate the efficacy and safety of two doses (placebo v 80m v 160m of GB0139 administered once per day via inhalation for 52 weeks as compared to placebo in subjects with Idiopathic Pulmonary Fibrosis. Key Primary End Point: Annual rate of decline in FVC expressed in mL over 52 weeks  Protocol #: GALACTIC-1, Clinical Trials #: [id number]: [email or web address] ([employer] [place] Protocol Version for 5.8 date of  22 Apr 2019 Main Consent Version for 01/22/2020 date of 25 Jul 2019 is IRB v.4.0  InPsychologist, forensicersion for 10.0 date of 18 Sep 2019  Key Features of GB0139: a Galectin-3 inhibitor designed specifically to modulate the fibrogenic response to tissue injury. In multiple models of organ fibrosis, it has been demonstrated that Gal-3 is potently pro-fibrotic, modulating the activity of fibroblasts and macrophages in chronically injured tissues. It is hypothesized that increased Gal-3 expression during chronic injury leads to fibroblast activation via the aggregation of growth factor receptor clusters (such as TGF-), thereby amplifying profibrotic signal cascades. [id number] been shown to reduce neutrophil activation, which may attenuate the immunoinflammatory response in SAR-CoV-2 infection.  Key Inclusion Criteria:           1 Male and male subjects aged ? 4063 Diagnosis of IPF established during the previous 3 years 5 months; An historical diagnostic HRCT scan within the 12 months prior to screening. Note: a separate HRCT scan will not be performed as part of the study.  2 a. FVC > 45% of the predicted  b. DLCO (corrected for Hb) of 30% to 79% of the predicted value at screening  3 Any existing SoC treatment must be deemed as stable by the PI/treating physician before randomization into the study.   Key Exclusion  Criteria:           1 Currently has significant airway obstruction: FEV1/FVC ratio of <0.7 at screening  2 Has clinical evidence of active infection  3 Has a history of malignancy within the last 2 years with the exception of basal cell carcinoma, squamous cell carcinoma of the skin, chronic lymphocytic leukaemia and prostate cancer requiring androgens, localised treatment and/or managed by observation.  4 Presence of other disease that may interfere with testing procedures or in judgment of PI may interfere with trial participation   5 Currently receiving: high dose corticosteroids, cytotoxic, vasodilator therapy for pulmonary hypertension, investigational therapy for IPF or administration of such therapeutics within 4 weeks of initial screening (or 5 half-lives, whichever is longer). A current dose of less than or equal to 15 mg/day of prednisone or its equivalent is acceptable if the dose is anticipated to remain stable during the study. Short term use (<1 month) of higher dose corticosteroid treatment is also permitted.  6 Has a history of unstable or deteriorating cardiac or pulmonary disease (other than IPF) within the previous six months   Key Inc/Excl criteria table only needs to be completed for consenting visit encounter.  Pharmacokinetics:  Dose Half-Life  3 mg 3.5 hours  10 mg 8 hours  20 mg 12 hours  50 mg 12 hours   -GB0139 displays dose-dependent increase in plasma concentrations -Maximum concentration (Cmax) was reached within 3 hours for all doses  Contraindications: -Hypersensitivity to GB0139 or lactose  Interactions: -GB0139 displayed weak inhibition of CYP2A6 and CYP2B6 -GB0139 did not cause meaningful induction of any CYP enzymes -Study concluded that there are likely no clinically  significant drug interactions   Safety Data: Version 10.0  18 Sep 2019  Animal studies: - No effect on central nervous system - No effect on blood pressure, heart rate or mean  arterial pressure - No effect on QTc - No effect on respiratory system - No mention of renal/hepatic effect  -In mice, GB0139 reduced: histological inflammation, activation and recruitment of neutrophils, pro-inflammatory cytokines -In mice, GB0139 increased: the quantity of CD8+ T lymphocytes -In mice, GB0139 did not impact mating performance or male fertility   Phase I studies: Conducted in 29 healthy patients, 41% reported treatment emergent adverse events (TEAE); all were mild in nature. The most frequently reported TEAE was dysgeusia. This occurred in 36% of patients and was transient. 8% of patients reported cough. 2 patients (5%) were diagnosed with an upper respiratory tract infection during the study, and  required treatment with antibiotics. 1 patient (2%) experienced a serious adverse event; pneumonia, this was fatal. Little systemic effect seen thus far.  -The most common adverse events deemed likely related to study drug were dysgeusia and cough -No cytoxicity or phototoxicity was seen -No TEAE led to drug discontinuation -No impact seen on laboratory parameters, vital signs nor ECG -One fatal TEAE was reported, though it was deemed unrelated to study drug  Phase II studies (ongoing): -37 SAEs have been reported for 26 participants; all SAEs were deemed unrelated to study drug -8 deaths have occurred; all deemed unrelated to study drug   Clinical Research Coordinator / Research RN note : This visit for Subject Stephen [name] with DOB: Dec 05, 1950 on 01/22/2020 for the above protocol is a Withdrawal Visit  and is for purpose of research. This is an early withdrawal visit per sponsor request due to a recent protocol change resulting from a recommendation by the DSMB. The patient was informed again of the reasoning of this early withdrawal and he expressed understanding. The consent for this encounter is under Protocol Version v5.8 and  Is currently IRB approved. Subject expressed  continued interest and consent in continuing his last study withdrawal visit. Subject confirmed that there was no change in contact information (e.g. address, telephone, email). Subject thanked for participation in research and contribution to science.   In this visit 01/22/2020 the subject will be evaluated by sub-investigator named Stephen [name]. This research coordinator has verified that the investigator Stephen [name] is uptodate with her training logs.  All study procedures were completed as defined by the protocol.  Additional details:  Because the PI is NOT available due to schedule issues, the sub-I reported and CRC has confirmed that the PI Dr. Chase [name] has discussed the visit prior with the sub-investigator  During the visit the patient stated that he had a catheter placed. However, it was promptly removed and to avoid future infection his doctor prescribed bactrim. The subject stated that he does not have a current infection and that it was prescribed just for future use in case of infection.  During the physical exam the Sub-I, Stephen [name], discovered that the rash/redness on Stephen [name] hands had gotten worse and the skin was very tight. The previous cause was thought to be a seafood allergy however the sub-I thought that this was no longer caused by an allergy but could be a side effect of that patient's esbriet. The sub-I advised the patient to visit a dermatologist as soon as possible to check on the rash/redness on his hand due to patient stating that is was getting worse. No itching or  irritation or pain stated by the patient.  During urinalysis a trace amount of protein and blood were discovered in the urine sample. This could be a result of the recent catheter placement/removal. Results will be discussed with PI and/or Sub-I to see if further action needs to be take.   History of AEs since subject signed consent:  Severity/CTCAE:  Action taken with study treatment?  Relationship to study drug: Outcome:  (GR. 1 )Mild (Gr. 2) Moderate (GR. 3) Severe (Gr. 4) Life Threatening (Gr.5) Death  1. Dose Increase 2. Dose not changed 3. Dose reduced 4. Dose interrupted 5. Drug withdrawn 6. N/A 7. Unknown  1. Unrelated 2. Unlikely 3. Possible 4. Probably 5. Definite  1. Recovered/resolved 2. Recovered/resolved with sequelae 3. Recovering/resolving 4. Not recovered/not resolved 5. Fatal 6. Unknown    AE # AE Event Start Date (DD/MMM/YYYY) SAE (Y/N) Severity Onset prior to drug start (Y/N) Action taken Relationship AE/SAE the result of an exacerbation? (Y/N)  Stop date  (DD/MMM/YYYY) Outcome   1 Hand abrasion (R) 19/JAN/2021 N 1 Y 6  N 05/FEB/2021 1  2 Ear infection 08/FEB/2021 N 2 Y 6  N 18/FEB/2021 1  3 Pruritus (recurrent) 01/FEB/2021 N 1 Y 6  N  3               The CRC has confirmed that the note will be routed to PI by sub-I if possible   Signed by  [place], [place] 7:46 AM 01/22/2020

## 2020-01-23 DIAGNOSIS — L219 Seborrheic dermatitis, unspecified: Secondary | ICD-10-CM | POA: Diagnosis not present

## 2020-01-23 DIAGNOSIS — L301 Dyshidrosis [pompholyx]: Secondary | ICD-10-CM | POA: Diagnosis not present

## 2020-01-27 ENCOUNTER — Encounter (HOSPITAL_COMMUNITY): Payer: PPO

## 2020-01-28 DIAGNOSIS — E7800 Pure hypercholesterolemia, unspecified: Secondary | ICD-10-CM | POA: Diagnosis not present

## 2020-01-28 DIAGNOSIS — I10 Essential (primary) hypertension: Secondary | ICD-10-CM | POA: Diagnosis not present

## 2020-01-28 NOTE — Progress Notes (Signed)
Discharge Progress Report  Patient Details  Name: Stephen [name] MRN: [id number] Date of Birth: [birth year] Referring Provider:     Pulmonary Rehab Walk Test from 09/11/2019 in [facility]  Referring Provider  Dr. Chase Caller       Number of Visits: 21  Reason for Discharge:  Patient reached a stable level of exercise. Patient independent in their exercise. Patient has met program and personal goals.  Smoking History:  Social History   Tobacco Use  Smoking Status Never Smoker  Smokeless Tobacco Never Used    Diagnosis:  Idiopathic pulmonary fibrosis ([place])  ADL [facility]: Pulmonary Assessment Scores    Row Name 09/11/19 1515 01/15/20 1505       ADL [facility]   ADL Phase  Entry  Exit    SOB Score total  38  52      CAT Score   CAT Score  17  16      mMRC Score   mMRC Score  --  2       Initial Exercise Prescription: Initial Exercise Prescription - 09/11/19 1500      Date of Initial Exercise RX and Referring Provider   Date  09/11/19    Referring Provider  Dr. Chase Caller      Oxygen   Oxygen  Continuous    Liters  2      NuStep   Level  2    SPM  80    Minutes  15      Arm Ergometer   Level  1    Minutes  15      Prescription Details   Frequency (times per week)  2    Duration  Progress to 30 minutes of continuous aerobic without signs/symptoms of physical distress      Intensity   THRR 40-80% of Max Heartrate  61-122    Ratings of Perceived Exertion  11-13    Perceived Dyspnea  0-4      Progression   Progression  Continue progressive overload as per policy without signs/symptoms or physical distress.      Resistance Training   Training Prescription  Yes    Weight  blue bands    Reps  10-15       Discharge Exercise Prescription (Final Exercise Prescription Changes): Exercise Prescription Changes - 01/06/20 1500      Response to Exercise   Blood Pressure (Admit)  136/80    Blood Pressure (Exercise)  134/70    Blood  Pressure (Exit)  128/74    Heart Rate (Admit)  71 bpm    Heart Rate (Exercise)  81 bpm    Heart Rate (Exit)  74 bpm    Oxygen Saturation (Admit)  98 %    Oxygen Saturation (Exercise)  96 %    Oxygen Saturation (Exit)  98 %    Rating of Perceived Exertion (Exercise)  11    Perceived Dyspnea (Exercise)  1    Duration  Continue with 30 min of aerobic exercise without signs/symptoms of physical distress.    Intensity  THRR unchanged      Progression   Progression  Continue to progress workloads to maintain intensity without signs/symptoms of physical distress.      Resistance Training   Training Prescription  Yes    Weight  blue bands    Reps  10-15    Time  10 Minutes      Oxygen   Oxygen  Continuous  Liters  2      NuStep   Level  4    SPM  80    Minutes  15    METs  1.8      Arm Ergometer   Level  3    Minutes  15       Functional Capacity: 6 Minute Walk    Row Name 09/11/19 1533 01/15/20 1454       6 Minute Walk   Phase  Initial  Discharge    Distance  600 feet  922 feet    Distance Feet Change  --  322 ft    Walk Time  5.25 minutes  6 minutes    # of Rest Breaks  1  0    MPH  1.14  1.75    METS  1.34  2.12    RPE  13  14    Perceived Dyspnea   2  3    VO2 Peak  4.69  7.41    Symptoms  Yes (comment)  Yes (comment)    Comments  45 sec standing break due to desaturation  used wheelchair    Resting HR  78 bpm  82 bpm    Resting BP  134/80  140/80    Resting Oxygen Saturation   96 %  96 %    Exercise Oxygen Saturation  during 6 min walk  80 %  87 %    Max Ex. HR  104 bpm  110 bpm    Max Ex. BP  152/82  170/80    2 Minute Post BP  132/84  140/80      Interval HR   1 Minute HR  97  78    2 Minute HR  101  82    3 Minute HR  104  81    4 Minute HR  93  105    5 Minute HR  94  110    6 Minute HR  96  106    2 Minute Post HR  91  84    Interval Heart Rate?  Yes  Yes      Interval Oxygen   Interval Oxygen?  Yes  Yes    Baseline Oxygen Saturation %   96 %  96 %    1 Minute Oxygen Saturation %  90 %  93 %    1 Minute Liters of Oxygen  0 L  2 L    2 Minute Oxygen Saturation %  86 %  87 %    2 Minute Liters of Oxygen  0 L  2 L    3 Minute Oxygen Saturation %  84 %  90 %    3 Minute Liters of Oxygen  0 L  3 L    4 Minute Oxygen Saturation %  80 %  90 %    4 Minute Liters of Oxygen  2 L  3 L    5 Minute Oxygen Saturation %  92 %  91 %    5 Minute Liters of Oxygen  2 L  3 L    6 Minute Oxygen Saturation %  93 %  93 %    6 Minute Liters of Oxygen  2 L  3 L    2 Minute Post Oxygen Saturation %  97 %  96 %    2 Minute Post Liters of Oxygen  2 L  2 L  Psychological, QOL, Others - Outcomes: PHQ 2/9: Depression screen [facility] 2/9 09/11/2019 09/11/2019  Decreased Interest - 0  Down, Depressed, Hopeless 0 0  PHQ - 2 Score 0 0  Altered sleeping 0 -  Tired, decreased energy 0 -  Change in appetite 0 -  Feeling bad or failure about yourself  0 -  Trouble concentrating 0 -  Moving slowly or fidgety/restless 0 -  Suicidal thoughts 0 -  PHQ-9 Score 0 -  Difficult doing work/chores Not difficult at all -    Quality of Life:   Personal Goals: Goals established at orientation with interventions provided to work toward goal. Personal Goals and Risk Factors at Admission - 09/11/19 1445      Core Components/Risk Factors/Patient Goals on Admission   Improve shortness of breath with ADL's  Yes    Intervention  Provide education, individualized exercise plan and daily activity instruction to help decrease symptoms of SOB with activities of daily living.    Expected Outcomes  Short Term: Improve cardiorespiratory fitness to achieve a reduction of symptoms when performing ADLs;Long Term: Be able to perform more ADLs without symptoms or delay the onset of symptoms        Personal Goals Discharge: Goals and Risk Factor Review    Row Name 09/11/19 1445 09/16/19 1553 10/13/19 1328 12/15/19 1415       Core Components/Risk Factors/Patient Goals  Review   Personal Goals Review  Develop more efficient breathing techniques such as purse lipped breathing and diaphragmatic breathing and practicing self-pacing with activity.;Increase knowledge of respiratory medications and ability to use respiratory devices properly.;Improve shortness of breath with ADL's  (Pended)   Develop more efficient breathing techniques such as purse lipped breathing and diaphragmatic breathing and practicing self-pacing with activity.;Increase knowledge of respiratory medications and ability to use respiratory devices properly.;Improve shortness of breath with ADL's  Develop more efficient breathing techniques such as purse lipped breathing and diaphragmatic breathing and practicing self-pacing with activity.;Increase knowledge of respiratory medications and ability to use respiratory devices properly.;Improve shortness of breath with ADL's  Develop more efficient breathing techniques such as purse lipped breathing and diaphragmatic breathing and practicing self-pacing with activity.;Increase knowledge of respiratory medications and ability to use respiratory devices properly.;Improve shortness of breath with ADL's    Review  --  Today was his first day of exercise class, too early to see progress towards admission goals.  Kasandra Knudsen has been in pulmonary rehab for 3 1/2 weeks.  He is exercising on level 2 of the arm ergomenter, and level 3 on the nustep.  His attendance is great and works hard while he is here.  Department closure x 7 weeks, will reopen 12/23/2019, will begin working on admission goals upon return to program.    Expected Outcomes  --  See admission goals.  See admission goals.  See admission goals.       Exercise Goals and Review: Exercise Goals    Row Name 09/11/19 1548 09/16/19 1457 10/13/19 1022 12/16/19 0914       Exercise Goals   Increase Physical Activity  Yes  Yes  Yes  Yes    Intervention  Provide advice, education, support and counseling about physical  activity/exercise needs.;Develop an individualized exercise prescription for aerobic and resistive training based on initial evaluation findings, risk stratification, comorbidities and participant's personal goals.  Provide advice, education, support and counseling about physical activity/exercise needs.;Develop an individualized exercise prescription for aerobic and resistive training based on initial evaluation findings, risk stratification,  comorbidities and participant's personal goals.  Provide advice, education, support and counseling about physical activity/exercise needs.;Develop an individualized exercise prescription for aerobic and resistive training based on initial evaluation findings, risk stratification, comorbidities and participant's personal goals.  Provide advice, education, support and counseling about physical activity/exercise needs.;Develop an individualized exercise prescription for aerobic and resistive training based on initial evaluation findings, risk stratification, comorbidities and participant's personal goals.    Expected Outcomes  Long Term: Exercising regularly at least 3-5 days a week.;Long Term: Add in home exercise to make exercise part of routine and to increase amount of physical activity.;Short Term: Attend rehab on a regular basis to increase amount of physical activity.  Long Term: Exercising regularly at least 3-5 days a week.;Long Term: Add in home exercise to make exercise part of routine and to increase amount of physical activity.;Short Term: Attend rehab on a regular basis to increase amount of physical activity.  Long Term: Exercising regularly at least 3-5 days a week.;Long Term: Add in home exercise to make exercise part of routine and to increase amount of physical activity.;Short Term: Attend rehab on a regular basis to increase amount of physical activity.  Long Term: Exercising regularly at least 3-5 days a week.;Long Term: Add in home exercise to make exercise  part of routine and to increase amount of physical activity.;Short Term: Attend rehab on a regular basis to increase amount of physical activity.    Increase Strength and Stamina  Yes  Yes  Yes  Yes    Intervention  Provide advice, education, support and counseling about physical activity/exercise needs.;Develop an individualized exercise prescription for aerobic and resistive training based on initial evaluation findings, risk stratification, comorbidities and participant's personal goals.  Provide advice, education, support and counseling about physical activity/exercise needs.;Develop an individualized exercise prescription for aerobic and resistive training based on initial evaluation findings, risk stratification, comorbidities and participant's personal goals.  Provide advice, education, support and counseling about physical activity/exercise needs.;Develop an individualized exercise prescription for aerobic and resistive training based on initial evaluation findings, risk stratification, comorbidities and participant's personal goals.  Provide advice, education, support and counseling about physical activity/exercise needs.;Develop an individualized exercise prescription for aerobic and resistive training based on initial evaluation findings, risk stratification, comorbidities and participant's personal goals.    Expected Outcomes  Short Term: Increase workloads from initial exercise prescription for resistance, speed, and METs.;Short Term: Perform resistance training exercises routinely during rehab and add in resistance training at home;Long Term: Improve cardiorespiratory fitness, muscular endurance and strength as measured by increased METs and functional capacity (6MWT)  Short Term: Increase workloads from initial exercise prescription for resistance, speed, and METs.;Short Term: Perform resistance training exercises routinely during rehab and add in resistance training at home;Long Term: Improve  cardiorespiratory fitness, muscular endurance and strength as measured by increased METs and functional capacity (6MWT)  Short Term: Increase workloads from initial exercise prescription for resistance, speed, and METs.;Short Term: Perform resistance training exercises routinely during rehab and add in resistance training at home;Long Term: Improve cardiorespiratory fitness, muscular endurance and strength as measured by increased METs and functional capacity (6MWT)  Short Term: Increase workloads from initial exercise prescription for resistance, speed, and METs.;Short Term: Perform resistance training exercises routinely during rehab and add in resistance training at home;Long Term: Improve cardiorespiratory fitness, muscular endurance and strength as measured by increased METs and functional capacity (6MWT)    Able to understand and use rate of perceived exertion (RPE) scale  Yes  Yes  Yes  Yes    Intervention  Provide education and explanation on how to use RPE scale  Provide education and explanation on how to use RPE scale  Provide education and explanation on how to use RPE scale  Provide education and explanation on how to use RPE scale    Expected Outcomes  Short Term: Able to use RPE daily in rehab to express subjective intensity level;Long Term:  Able to use RPE to guide intensity level when exercising independently  Short Term: Able to use RPE daily in rehab to express subjective intensity level;Long Term:  Able to use RPE to guide intensity level when exercising independently  Short Term: Able to use RPE daily in rehab to express subjective intensity level;Long Term:  Able to use RPE to guide intensity level when exercising independently  Short Term: Able to use RPE daily in rehab to express subjective intensity level;Long Term:  Able to use RPE to guide intensity level when exercising independently    Able to understand and use Dyspnea scale  Yes  Yes  Yes  Yes    Intervention  Provide education  and explanation on how to use Dyspnea scale  Provide education and explanation on how to use Dyspnea scale  Provide education and explanation on how to use Dyspnea scale  Provide education and explanation on how to use Dyspnea scale    Expected Outcomes  Short Term: Able to use Dyspnea scale daily in rehab to express subjective sense of shortness of breath during exertion;Long Term: Able to use Dyspnea scale to guide intensity level when exercising independently  Short Term: Able to use Dyspnea scale daily in rehab to express subjective sense of shortness of breath during exertion;Long Term: Able to use Dyspnea scale to guide intensity level when exercising independently  Short Term: Able to use Dyspnea scale daily in rehab to express subjective sense of shortness of breath during exertion;Long Term: Able to use Dyspnea scale to guide intensity level when exercising independently  Short Term: Able to use Dyspnea scale daily in rehab to express subjective sense of shortness of breath during exertion;Long Term: Able to use Dyspnea scale to guide intensity level when exercising independently    Knowledge and understanding of Target Heart Rate Range (THRR)  Yes  Yes  Yes  Yes    Intervention  --  --  Provide education and explanation of THRR including how the numbers were predicted and where they are located for reference  --    Expected Outcomes  Short Term: Able to state/look up THRR;Short Term: Able to use daily as guideline for intensity in rehab;Long Term: Able to use THRR to govern intensity when exercising independently  Short Term: Able to state/look up THRR;Short Term: Able to use daily as guideline for intensity in rehab;Long Term: Able to use THRR to govern intensity when exercising independently  Short Term: Able to state/look up THRR;Short Term: Able to use daily as guideline for intensity in rehab;Long Term: Able to use THRR to govern intensity when exercising independently  Short Term: Able to  state/look up THRR;Short Term: Able to use daily as guideline for intensity in rehab;Long Term: Able to use THRR to govern intensity when exercising independently    Understanding of Exercise Prescription  Yes  Yes  Yes  Yes    Intervention  Provide education, explanation, and written materials on patient's individual exercise prescription  Provide education, explanation, and written materials on patient's individual exercise prescription  Provide education, explanation, and written materials  on patient's individual exercise prescription  Provide education, explanation, and written materials on patient's individual exercise prescription    Expected Outcomes  Short Term: Able to explain program exercise prescription;Long Term: Able to explain home exercise prescription to exercise independently  Short Term: Able to explain program exercise prescription;Long Term: Able to explain home exercise prescription to exercise independently  Short Term: Able to explain program exercise prescription;Long Term: Able to explain home exercise prescription to exercise independently  Short Term: Able to explain program exercise prescription;Long Term: Able to explain home exercise prescription to exercise independently       Exercise Goals Re-Evaluation: Exercise Goals Re-Evaluation    Row Name 09/16/19 1458 10/13/19 1022 12/16/19 0914         Exercise Goal Re-Evaluation   Exercise Goals Review  Increase Physical Activity;Increase Strength and Stamina;Able to understand and use rate of perceived exertion (RPE) scale;Able to understand and use Dyspnea scale;Knowledge and understanding of Target Heart Rate Range (THRR);Understanding of Exercise Prescription  Increase Physical Activity;Increase Strength and Stamina;Able to understand and use rate of perceived exertion (RPE) scale;Able to understand and use Dyspnea scale;Knowledge and understanding of Target Heart Rate Range (THRR);Understanding of Exercise Prescription   Increase Physical Activity;Increase Strength and Stamina;Able to understand and use rate of perceived exertion (RPE) scale;Able to understand and use Dyspnea scale;Knowledge and understanding of Target Heart Rate Range (THRR);Understanding of Exercise Prescription     Comments  Pt has attended 1 exercise session. Pt exercised at 1.7 METs on the stepper. Will monitor and progress as able.  Pt has attended 7 exercise sessions. Pt is progressing slow, but maintains a positive outlook. Pt currently exercises at 1.7 METs on the stepper. Will continue to monitor and progress as able.  Pt has been walking in his house since we ceased in person exercise. Pt will return 2/23. Will monitor and progress as able.     Expected Outcomes  Through exercise at rehab and at home, the patient will decrease shortness of breath with daily activities and feel confident in carrying out an exercise regime at home.  Through exercise at rehab and at home, the patient will decrease shortness of breath with daily activities and feel confident in carrying out an exercise regime at home.  Through exercise at rehab and at home, the patient will decrease shortness of breath with daily activities and feel confident in carrying out an exercise regime at home.        Nutrition & Weight - Outcomes: Pre Biometrics - 09/11/19 1442      Pre Biometrics   Height  _0  (1.702 m)    Weight  112.2 kg    BMI (Calculated)  38.73        Nutrition: Nutrition Therapy & Goals - 10/02/19 1519      Nutrition Therapy   Diet  Low Sodium/Therapeutic lifestyle changes    Drug/Food Interactions  Statins/Certain Fruits      Personal Nutrition Goals   Nutrition Goal  Pt to identify food quantities necessary to achieve weight loss of 6-24 lb at graduation from cardiac rehab.    Personal Goal #2  Pt to build a healthy plate including vegetables, fruits, whole grains, and low-fat dairy products in a heart healthy meal plan.      Intervention Plan    Intervention  Prescribe, educate and counsel regarding individualized specific dietary modifications aiming towards targeted core components such as weight, hypertension, lipid management, diabetes, heart failure and other comorbidities.;Nutrition handout(s) given to  patient.    Expected Outcomes  Short Term Goal: A plan has been developed with personal nutrition goals set during dietitian appointment.;Long Term Goal: Adherence to prescribed nutrition plan.       Nutrition Discharge: Nutrition Assessments - 01/27/20 1039      Rate Your Plate Scores   Post Score  65       Education Questionnaire Score: Knowledge Questionnaire Score - 01/15/20 1516      Knowledge Questionnaire Score   Post Score  17/18       Goals reviewed with patient; copy given to patient.

## 2020-02-02 ENCOUNTER — Telehealth: Payer: Self-pay | Admitting: Internal Medicine

## 2020-02-03 NOTE — Telephone Encounter (Signed)
This phone call was performed as a follow up on the previous study subject for the [employer] protocol. Subject had his withdrawal visit on January 21, 2020. During this visit, the sub-I, Tammy Parrett, noticed redness and tightness on both of the patient's hands during his physical exam. The Sub-I was concerned that this could possibly be related to the patient's esbriet so she suggested that Mr. Jankowski schedule an appointment with his dermatologist and we would follow up with him afterwards. After his appointment, MR. Stubblefield was contacted by study staff and said he had an appointment with the dermatologist, Christin Fudge, NP, on March 26th and was diagnosed with dermatitis. MR. Ohanesian was put on 4 new medications to resolved his symptoms. The medications and corresponding details of each medication can be found in the subject's study binder.  [place]

## 2020-02-11 ENCOUNTER — Telehealth: Payer: Self-pay | Admitting: Internal Medicine

## 2020-02-11 NOTE — Telephone Encounter (Signed)
Called and spoke with pt in regards to the message received from [facility].  Pt wanted to try to get a POC from [facility] and was told that pt would need to do a walk test. Pt stated to them that when he has had pulmonary rehab, they usually have to bump him up to 3L but pt was told by [facility] that they were unable to just bump him up without getting approval from MD before they could just up his O2.  Stated to pt what we need to do is schedule an OV as DME's usually require a recent OV if any changes need to be made with O2 and stated to him during that OV we could walk him using the POC to see how many liters he would require. Pt verbalized understanding. OV scheduled for pt Friday 4/16 with TP and made a comment in there that pt needs to be walked with POC. Nothing further needed.

## 2020-02-12 DIAGNOSIS — K219 Gastro-esophageal reflux disease without esophagitis: Secondary | ICD-10-CM | POA: Diagnosis not present

## 2020-02-12 DIAGNOSIS — K2270 Barrett's esophagus without dysplasia: Secondary | ICD-10-CM | POA: Diagnosis not present

## 2020-02-13 ENCOUNTER — Telehealth: Payer: Self-pay | Admitting: Internal Medicine

## 2020-02-13 ENCOUNTER — Encounter: Payer: Self-pay | Admitting: Adult Health

## 2020-02-13 ENCOUNTER — Other Ambulatory Visit: Payer: Self-pay

## 2020-02-13 ENCOUNTER — Ambulatory Visit: Payer: PPO | Admitting: Adult Health

## 2020-02-13 VITALS — BP 126/64 | HR 65 | Temp 97.7°F | Ht 66.0 in | Wt 240.8 lb

## 2020-02-13 DIAGNOSIS — E669 Obesity, unspecified: Secondary | ICD-10-CM

## 2020-02-13 DIAGNOSIS — J9611 Chronic respiratory failure with hypoxia: Secondary | ICD-10-CM

## 2020-02-13 DIAGNOSIS — R0602 Shortness of breath: Secondary | ICD-10-CM

## 2020-02-13 DIAGNOSIS — J84112 Idiopathic pulmonary fibrosis: Secondary | ICD-10-CM

## 2020-02-13 HISTORY — DX: Chronic respiratory failure with hypoxia: J96.11

## 2020-02-13 LAB — HEPATIC FUNCTION PANEL
ALT: 19 U/L (ref 0–53)
AST: 27 U/L (ref 0–37)
Albumin: 4.5 g/dL (ref 3.5–5.2)
Alkaline Phosphatase: 76 U/L (ref 39–117)
Bilirubin, Direct: 0.2 mg/dL (ref 0.0–0.3)
Total Bilirubin: 0.5 mg/dL (ref 0.2–1.2)
Total Protein: 7.9 g/dL (ref 6.0–8.3)

## 2020-02-13 MED ORDER — HYDROCODONE-HOMATROPINE 5-1.5 MG/5ML PO SYRP: 5.0000 mL | ORAL_SOLUTION | Freq: Three times a day (TID) | ORAL | 0 refills | Status: DC | PRN

## 2020-02-13 NOTE — Patient Instructions (Signed)
Continue on Esbriet 3 capsules Three times a day   Continue on Oxygen 2l/m at rest , 3 l/m with activity  POC order.  Activity as tolerated.  Labs today .  Follow up with Dr. Chase Caller in 3 months and As needed

## 2020-02-13 NOTE — Telephone Encounter (Signed)
Yes I can do cough medication. I wrote for it before. Needs fingerprint sign and I can come to [facility] 02/13/2020  in afternoon and sign it

## 2020-02-13 NOTE — Telephone Encounter (Signed)
done

## 2020-02-13 NOTE — Assessment & Plan Note (Signed)
Healthy weight loss

## 2020-02-13 NOTE — Assessment & Plan Note (Signed)
Continue on oxygen\order for POC sent to DME company.

## 2020-02-13 NOTE — Telephone Encounter (Signed)
Spoke with pt, he would like to get status of POC and cough medication. The order for POC was sent to [facility] today and TP is still deciding on cough medication. Pt was told that the MD would have to write for this. MR are you willing to refill cough medication.

## 2020-02-13 NOTE — Telephone Encounter (Signed)
Medication has been reordered and pended.

## 2020-02-13 NOTE — Telephone Encounter (Signed)
  I meant you do it and get it ready for me to FP sign and I will go down and fngerprint sign it

## 2020-02-13 NOTE — Telephone Encounter (Signed)
Ok great, let us know when its done.

## 2020-02-13 NOTE — Progress Notes (Signed)
_0  ID: Stephen [name], male    DOB: 11/28/1950, 69 y.o.   MRN: [id number]  Chief Complaint  Patient presents with  . Follow-up    ILD     Referring provider: Ronita Hipps, MD  HPI: 69 year old male former smoker followed for IPF.  (Diagnosed 2019) and chronic resp failure on Oxygen   TEST/EVENTS :   Took Ofev briefly , stopped August 2020 due to intolerance. Esbriet started September 2020  02/13/2020 Follow up : ILD  Patient presents for a 21-monthfollow-up.  Patient says overall breathing is doing about the same.  He has had no flare of his cough or shortness of breath.  Says his activity level is about at baseline.  Says he would like to be more active has been try to do some yard work however is very hard for him to carry his oxygen tanks.  Would like a portable oxygen concentrator.  Walk test in the office shows desaturation below 88% on room air.  Requires 2 L of oxygen to keep O2 saturations greater than 88%.  Patient gets short of breath with heavy activity.  Has to stop and rest several times.  He remains on Esbriet.  Denies any nausea vomiting diarrhea.     Allergies  Allergen Reactions  . Ciprofloxacin Other (See Comments)    colitis  . Codeine   . Other     Seafood allergy    Immunization History  Administered Date(s) Administered  . Fluad Quad(high Dose 65+) 06/30/2019  . Influenza, High Dose Seasonal PF 08/28/2017, 07/19/2018  . PFIZER SARS-COV-2 Vaccination 11/16/2019, 12/06/2019  . Pneumococcal Conjugate-13 08/25/2016  . Pneumococcal Polysaccharide-23 11/18/2007    Past Medical History:  Diagnosis Date  . Allergy   . Arthritis   . GERD (gastroesophageal reflux disease)   . Heart murmur   . Hiatal hernia   . Hyperlipidemia   . Hypertension   . Sleep apnea   . Vertigo     Tobacco History: Social History   Tobacco Use  Smoking Status Never Smoker  Smokeless Tobacco Never Used   Counseling given: Not Answered   Outpatient Medications  Prior to Visit  Medication Sig Dispense Refill  . amitriptyline (ELAVIL) 75 MG tablet Take 75 mg by mouth at bedtime.    .Marland KitchenamLODipine (NORVASC) 10 MG tablet Take 10 mg by mouth daily.    .Marland Kitchenaspirin EC 81 MG tablet Take 81 mg by mouth daily.    . calcium carbonate (OSCAL) 1500 (600 Ca) MG TABS tablet Take by mouth 2 (two) times daily with a meal.    . calcium carbonate (TUMS - DOSED IN MG ELEMENTAL CALCIUM) 500 MG chewable tablet Chew 1 tablet by mouth daily.    . cholecalciferol (VITAMIN D) 1000 units tablet Take 1,000 Units by mouth daily.    . cloNIDine (CATAPRES) 0.3 MG tablet Take 0.3 mg by mouth daily after supper.     . dicyclomine (BENTYL) 10 MG capsule Take 10 mg by mouth 3 (three) times daily before meals.     .Marland KitchenHYDROcodone-homatropine (HYCODAN) 5-1.5 MG/5ML syrup Take 5 mLs by mouth 3 (three) times daily as needed for cough. 450 mL 0  . lansoprazole (PREVACID) 30 MG capsule Take 30 mg by mouth daily at 12 noon.    . Methylcellulose, Laxative, (CITRUCEL PO) Take 1 Scoop by mouth.    . metoprolol tartrate (LOPRESSOR) 50 MG tablet Take 1 tablet by mouth 2 (two) times daily.    .Marland Kitchen  montelukast (SINGULAIR) 10 MG tablet Take 10 mg by mouth at bedtime.    . Multiple Vitamin (MULTIVITAMIN WITH MINERALS) TABS tablet Take 1 tablet by mouth daily.    . nabumetone (RELAFEN) 750 MG tablet Take 750 mg by mouth 2 (two) times daily.    Marland Kitchen olmesartan (BENICAR) 20 MG tablet Take 1 tablet by mouth daily.    . Pirfenidone (ESBRIET) 267 MG CAPS Take 801 mg by mouth 3 (three) times daily. Takes 3 caps three times daily     . Pirfenidone (ESBRIET) 801 MG TABS Take 1 tablet by mouth 3 (three) times daily. 270 tablet 0  . polyethylene glycol (MIRALAX / GLYCOLAX) packet Take 17 g by mouth daily.    . Probiotic Product (PROBIOTIC DAILY PO) Take 1 capsule by mouth daily.    . rosuvastatin (CRESTOR) 10 MG tablet Take 10 mg by mouth daily.    . sodium chloride 1 g tablet Take 3 g by mouth 3 (three) times daily.       Marland Kitchen spironolactone (ALDACTONE) 25 MG tablet Take 25 mg by mouth 3 (three) times a week.    Marland Kitchen UNABLE TO FIND Take 1 capsule by mouth 3 (three) times daily. Med Name: blood sugar harmony    . UNABLE TO FIND Take 1 capsule by mouth 3 (three) times daily. Med Name: Clear lungs    . vitamin B-12 (CYANOCOBALAMIN) 1000 MCG tablet Take 1,000 mcg by mouth daily.    . vitamin E 400 UNIT capsule Take 400 Units by mouth daily.     No facility-administered medications prior to visit.     Review of Systems:   Constitutional:   No  weight loss, night sweats,  Fevers, chills,  +fatigue, or  lassitude.  HEENT:   No headaches,  Difficulty swallowing,  Tooth/dental problems, or  Sore throat,                No sneezing, itching, ear ache, nasal congestion, post nasal drip,   CV:  No chest pain,  Orthopnea, PND, swelling in lower extremities, anasarca, dizziness, palpitations, syncope.   GI  No heartburn, indigestion, abdominal pain, nausea, vomiting, diarrhea, change in bowel habits, loss of appetite, bloody stools.   Resp:    No chest wall deformity  Skin: no rash or lesions.  GU: no dysuria, change in color of urine, no urgency or frequency.  No flank pain, no hematuria   MS:  No joint pain or swelling.  No decreased range of motion.  No back pain.    Physical Exam  BP 126/64 (BP Location: Left Arm, Cuff Size: Normal)   Pulse 65   Temp 97.7 F (36.5 C) (Temporal)   Ht _0  (1.676 m)   Wt 240 lb 12.8 oz (109.2 kg)   SpO2 98%   BMI 38.87 kg/m   GEN: A/Ox3; pleasant , NAD, BMI 38   HEENT:  Sisters/AT,  EACs-clear, TMs-wnl, NOSE-clear, THROAT-clear, no lesions, no postnasal drip or exudate noted.   NECK:  Supple w/ fair ROM; no JVD; normal carotid impulses w/o bruits; no thyromegaly or nodules palpated; no lymphadenopathy.    RESP bibasilar crackles . no accessory muscle use, no dullness to percussion  CARD:  RRR, no m/r/g, no peripheral edema, pulses intact, no cyanosis or clubbing.  GI:    Soft & nt; nml bowel sounds; no organomegaly or masses detected.   Musco: Warm bil, no deformities or joint swelling noted.   Neuro: alert, no focal deficits noted.  Skin: Warm, no lesions or rashes    Lab Results:   BNP No results found for: BNP  ProBNP No results found for: PROBNP  Imaging: No results found.    PFT Results Latest Ref Rng & Units 08/27/2019 10/03/2018 08/16/2018 04/11/2018  FVC-Pre L 2.18 2.42 2.53 2.57  FVC-Predicted Pre % 56 65 67 63  Pre FEV1/FVC % % 82 91 89 89  FEV1-Pre L 1.78 2.22 2.26 2.29  FEV1-Predicted Pre % 62 80 81 76  DLCO UNC% % 59 - - 58  DLCO COR %Predicted % 97 - - 106    No results found for: NITRICOXIDE      Assessment & Plan:   Idiopathic pulmonary fibrosis (HCC) Appears to be stable  Plan  Patient Instructions  Continue on Esbriet 3 capsules Three times a day   Continue on Oxygen 2l/m at rest , 3 l/m with activity  POC order.  Activity as tolerated.  Labs today .  Follow up with Dr. Chase Caller in 3 months and As needed        Obesity Healthy weight loss  Chronic respiratory failure with hypoxia ([place]) Continue on oxygen\order for POC sent to DME company.     Rexene Edison, NP 02/13/2020

## 2020-02-13 NOTE — Assessment & Plan Note (Signed)
Appears to be stable  Plan  Patient Instructions  Continue on Esbriet 3 capsules Three times a day   Continue on Oxygen 2l/m at rest , 3 l/m with activity  POC order.  Activity as tolerated.  Labs today .  Follow up with Dr. Chase Caller in 3 months and As needed

## 2020-02-16 ENCOUNTER — Telehealth: Payer: Self-pay | Admitting: Adult Health

## 2020-02-16 NOTE — Telephone Encounter (Signed)
Response from [facility] O2 order:  Stephen [name] sent to Stephen [name]  This pt has been on continuous O2 with [place] since November 2020. Are we discontinuing portability since orders are for nocturnal only??

## 2020-02-16 NOTE — Telephone Encounter (Signed)
I asked Derry Skill about this the other day because the order states at night only. According to TP note, the patient is supposed to be on oxygen at rest and with exertion. We can disregard this order, does another one need to be placed?   Patient Instructions by Melvenia Needles, NP at 02/13/2020 12:00 PM Author: Melvenia Needles, NP Author Type: Nurse Practitioner Filed: 02/13/2020 12:09 PM  Note Status: Signed Cosign: Cosign Not Required Encounter Date: 02/13/2020  Editor: Melvenia Needles, NP (Nurse Practitioner)    Continue on Esbriet 3 capsules Three times a day   Continue on Oxygen 2l/m at rest , 3 l/m with activity  POC order.  Activity as tolerated.  Labs today .  Follow up with Dr. Chase Caller in 3 months and As needed

## 2020-02-16 NOTE — Telephone Encounter (Signed)
Ok thanks

## 2020-02-16 NOTE — Telephone Encounter (Signed)
I don't know if a new order needs to be placed.  Maybe check with TP.

## 2020-02-17 DIAGNOSIS — H35362 Drusen (degenerative) of macula, left eye: Secondary | ICD-10-CM | POA: Diagnosis not present

## 2020-02-17 DIAGNOSIS — Z961 Presence of intraocular lens: Secondary | ICD-10-CM | POA: Diagnosis not present

## 2020-02-17 DIAGNOSIS — J84112 Idiopathic pulmonary fibrosis: Secondary | ICD-10-CM | POA: Diagnosis not present

## 2020-02-17 DIAGNOSIS — H11153 Pinguecula, bilateral: Secondary | ICD-10-CM | POA: Diagnosis not present

## 2020-02-20 ENCOUNTER — Telehealth: Payer: Self-pay | Admitting: Adult Health

## 2020-02-20 DIAGNOSIS — G4733 Obstructive sleep apnea (adult) (pediatric): Secondary | ICD-10-CM | POA: Diagnosis not present

## 2020-02-20 DIAGNOSIS — J9611 Chronic respiratory failure with hypoxia: Secondary | ICD-10-CM

## 2020-02-20 NOTE — Telephone Encounter (Signed)
Spoke to pt to check status of POC order. Per chart there are multiple messages on this.  To summarize, order was placed on 02/13/2020 for a POC but only to be used at night.    Copied recs from 4/16 office visit:  --------------------- Continue on Esbriet 3 capsulesThree times a day Continue on Oxygen 2l/m at rest , 3 l/m with activity  POC order.  Activity as tolerated.  Labs today .  Follow up withDr. Caroll Rancher 3 months andAs needed -----------------------  The order placed does not reflect what was needed to be ordered.  New order placed to appropriately reflect pt's O2 needs.  Nothing further needed at this time- will close encounter.

## 2020-02-23 DIAGNOSIS — J84112 Idiopathic pulmonary fibrosis: Secondary | ICD-10-CM | POA: Diagnosis not present

## 2020-02-23 DIAGNOSIS — G4733 Obstructive sleep apnea (adult) (pediatric): Secondary | ICD-10-CM | POA: Diagnosis not present

## 2020-02-27 DIAGNOSIS — E7800 Pure hypercholesterolemia, unspecified: Secondary | ICD-10-CM | POA: Diagnosis not present

## 2020-02-27 DIAGNOSIS — I10 Essential (primary) hypertension: Secondary | ICD-10-CM | POA: Diagnosis not present

## 2020-02-27 DIAGNOSIS — Z01818 Encounter for other preprocedural examination: Secondary | ICD-10-CM | POA: Diagnosis not present

## 2020-03-05 DIAGNOSIS — K449 Diaphragmatic hernia without obstruction or gangrene: Secondary | ICD-10-CM | POA: Diagnosis not present

## 2020-03-05 DIAGNOSIS — K5730 Diverticulosis of large intestine without perforation or abscess without bleeding: Secondary | ICD-10-CM | POA: Diagnosis not present

## 2020-03-05 DIAGNOSIS — Z7982 Long term (current) use of aspirin: Secondary | ICD-10-CM | POA: Diagnosis not present

## 2020-03-05 DIAGNOSIS — K648 Other hemorrhoids: Secondary | ICD-10-CM | POA: Diagnosis not present

## 2020-03-05 DIAGNOSIS — K2270 Barrett's esophagus without dysplasia: Secondary | ICD-10-CM | POA: Diagnosis not present

## 2020-03-05 DIAGNOSIS — Z79899 Other long term (current) drug therapy: Secondary | ICD-10-CM | POA: Diagnosis not present

## 2020-03-05 DIAGNOSIS — Z86010 Personal history of colonic polyps: Secondary | ICD-10-CM | POA: Diagnosis not present

## 2020-03-05 DIAGNOSIS — G4730 Sleep apnea, unspecified: Secondary | ICD-10-CM | POA: Diagnosis not present

## 2020-03-05 DIAGNOSIS — J8410 Pulmonary fibrosis, unspecified: Secondary | ICD-10-CM | POA: Diagnosis not present

## 2020-03-05 DIAGNOSIS — K22719 Barrett's esophagus with dysplasia, unspecified: Secondary | ICD-10-CM | POA: Diagnosis not present

## 2020-03-05 DIAGNOSIS — I10 Essential (primary) hypertension: Secondary | ICD-10-CM | POA: Diagnosis not present

## 2020-03-05 DIAGNOSIS — K219 Gastro-esophageal reflux disease without esophagitis: Secondary | ICD-10-CM | POA: Diagnosis not present

## 2020-03-05 DIAGNOSIS — K222 Esophageal obstruction: Secondary | ICD-10-CM | POA: Diagnosis not present

## 2020-03-11 DIAGNOSIS — J84112 Idiopathic pulmonary fibrosis: Secondary | ICD-10-CM | POA: Diagnosis not present

## 2020-03-11 DIAGNOSIS — Z6836 Body mass index (BMI) 36.0-36.9, adult: Secondary | ICD-10-CM | POA: Diagnosis not present

## 2020-03-11 DIAGNOSIS — I10 Essential (primary) hypertension: Secondary | ICD-10-CM | POA: Diagnosis not present

## 2020-03-11 DIAGNOSIS — Z79899 Other long term (current) drug therapy: Secondary | ICD-10-CM | POA: Diagnosis not present

## 2020-03-11 DIAGNOSIS — E7800 Pure hypercholesterolemia, unspecified: Secondary | ICD-10-CM | POA: Diagnosis not present

## 2020-03-11 DIAGNOSIS — E119 Type 2 diabetes mellitus without complications: Secondary | ICD-10-CM | POA: Diagnosis not present

## 2020-03-18 DIAGNOSIS — J84112 Idiopathic pulmonary fibrosis: Secondary | ICD-10-CM | POA: Diagnosis not present

## 2020-03-24 DIAGNOSIS — G4733 Obstructive sleep apnea (adult) (pediatric): Secondary | ICD-10-CM | POA: Diagnosis not present

## 2020-03-24 DIAGNOSIS — J84112 Idiopathic pulmonary fibrosis: Secondary | ICD-10-CM | POA: Diagnosis not present

## 2020-03-29 DIAGNOSIS — I10 Essential (primary) hypertension: Secondary | ICD-10-CM | POA: Diagnosis not present

## 2020-03-29 DIAGNOSIS — E785 Hyperlipidemia, unspecified: Secondary | ICD-10-CM | POA: Diagnosis not present

## 2020-04-12 ENCOUNTER — Ambulatory Visit: Payer: PPO | Admitting: Cardiology

## 2020-04-12 ENCOUNTER — Other Ambulatory Visit: Payer: Self-pay

## 2020-04-12 ENCOUNTER — Telehealth: Payer: Self-pay | Admitting: Internal Medicine

## 2020-04-12 ENCOUNTER — Encounter: Payer: Self-pay | Admitting: Cardiology

## 2020-04-12 VITALS — BP 134/60 | HR 75 | Ht 66.0 in | Wt 237.8 lb

## 2020-04-12 DIAGNOSIS — E7800 Pure hypercholesterolemia, unspecified: Secondary | ICD-10-CM

## 2020-04-12 DIAGNOSIS — I7090 Unspecified atherosclerosis: Secondary | ICD-10-CM

## 2020-04-12 DIAGNOSIS — J84112 Idiopathic pulmonary fibrosis: Secondary | ICD-10-CM

## 2020-04-12 DIAGNOSIS — E663 Overweight: Secondary | ICD-10-CM | POA: Diagnosis not present

## 2020-04-12 LAB — BASIC METABOLIC PANEL
BUN/Creatinine Ratio: 10 (ref 10–24)
BUN: 7 mg/dL — ABNORMAL LOW (ref 8–27)
CO2: 21 mmol/L (ref 20–29)
Calcium: 9.3 mg/dL (ref 8.6–10.2)
Chloride: 91 mmol/L — ABNORMAL LOW (ref 96–106)
Creatinine, Ser: 0.7 mg/dL — ABNORMAL LOW (ref 0.76–1.27)
GFR calc Af Amer: 111 mL/min/{1.73_m2} (ref >59)
GFR calc non Af Amer: 96 mL/min/{1.73_m2} (ref >59)
Glucose: 98 mg/dL (ref 65–99)
Potassium: 4.5 mmol/L (ref 3.5–5.2)
Sodium: 128 mmol/L — ABNORMAL LOW (ref 134–144)

## 2020-04-12 LAB — TSH: TSH: 2.93 u[IU]/mL (ref 0.450–4.500)

## 2020-04-12 NOTE — Telephone Encounter (Signed)
IF the daughter was hospitalized - he should not go near her for 3 weeks from positive test If the daughter was NOT hospitalized - he should not go near her for 2 weeks from positive test  Out-doors - great Vaccinated - great  Others coming there -If they are vaccinated too or had covid - even better  Regarding masking - I would say as personal caution to mask but CDC says no need to outdoors

## 2020-04-12 NOTE — Progress Notes (Signed)
Cardiology Office Note:    Date:  04/12/2020   ID:  Stephen [name], DOB 01-06-1951, MRN [id number]  PCP:  Ronita Hipps, MD  Cardiologist:  Jenean Lindau, MD   Referring MD: Ronita Hipps, MD    ASSESSMENT:    1. ASVD (arteriosclerotic vascular disease)   2. Hypercholesteremia   3. Idiopathic pulmonary fibrosis ([place])   4. Overweight    PLAN:    In order of problems listed above:  1. Coronary artery disease: Secondary prevention stressed with the patient.  Importance of compliance with diet medication stressed and he vocalized understanding.  Weight reduction was stressed and he promises to do better. 2. Mixed dyslipidemia: Lipids were reviewed diet was emphasized.  Last lipid work was satisfactory. 3. Essential hypertension: Blood pressure stable 4. Overweight: Diet was emphasized.  Weight reduction was stressed and less obesity explained and he understood. 5. Patient will be seen in follow-up appointment in 6 months or earlier if the patient has any concerns    Medication Adjustments/Labs and Tests Ordered: Current medicines are reviewed at length with the patient today.  Concerns regarding medicines are outlined above.  No orders of the defined types were placed in this encounter.  No orders of the defined types were placed in this encounter.    No chief complaint on file.    History of Present Illness:    Stephen [name] is a 69 y.o. male.  Patient has past medical history of atherosclerotic vascular disease, idiopathic pulmonary fibrosis, dyslipidemia and is overweight.  He leads a sedentary lifestyle because of pulmonary issues.  He has shortness of breath on minimal exertion.  No chest pain orthopnea or PND.  At the time of my evaluation, the patient is alert awake oriented and in no distress.  Past Medical History:  Diagnosis Date  . Allergy   . Arthritis   . GERD (gastroesophageal reflux disease)   . Heart murmur   . Hiatal hernia   . Hyperlipidemia   .  Hypertension   . Sleep apnea   . Vertigo     Past Surgical History:  Procedure Laterality Date  . CARDIAC CATHETERIZATION  03/22/2001  . LEG SURGERY Bilateral 04/2001    Current Medications: Current Meds  Medication Sig  . amitriptyline (ELAVIL) 75 MG tablet Take 75 mg by mouth at bedtime.  Marland Kitchen amLODipine (NORVASC) 10 MG tablet Take 10 mg by mouth daily.  Marland Kitchen aspirin EC 81 MG tablet Take 81 mg by mouth daily.  . calcium carbonate (OSCAL) 1500 (600 Ca) MG TABS tablet Take by mouth 2 (two) times daily with a meal.  . calcium carbonate (TUMS - DOSED IN MG ELEMENTAL CALCIUM) 500 MG chewable tablet Chew 1 tablet by mouth daily.  . cholecalciferol (VITAMIN D) 1000 units tablet Take 1,000 Units by mouth daily.  . cloNIDine (CATAPRES) 0.3 MG tablet Take 0.3 mg by mouth daily after supper.   . dicyclomine (BENTYL) 10 MG capsule Take 10 mg by mouth 3 (three) times daily before meals.   Marland Kitchen HYDROcodone-homatropine (HYCODAN) 5-1.5 MG/5ML syrup Take 5 mLs by mouth 3 (three) times daily as needed for cough.  . lansoprazole (PREVACID) 30 MG capsule Take 30 mg by mouth daily at 12 noon.  . Methylcellulose, Laxative, (CITRUCEL PO) Take 1 Scoop by mouth.  . metoprolol tartrate (LOPRESSOR) 50 MG tablet Take 1 tablet by mouth 2 (two) times daily.  . montelukast (SINGULAIR) 10 MG tablet Take 10 mg by mouth at bedtime.  Marland Kitchen  Multiple Vitamin (MULTIVITAMIN WITH MINERALS) TABS tablet Take 1 tablet by mouth daily.  . nabumetone (RELAFEN) 750 MG tablet Take 750 mg by mouth 2 (two) times daily.  Marland Kitchen olmesartan (BENICAR) 20 MG tablet Take 1 tablet by mouth daily.  . Pirfenidone (ESBRIET) 267 MG CAPS Take 801 mg by mouth 3 (three) times daily. Takes 3 caps three times daily   . Pirfenidone (ESBRIET) 801 MG TABS Take 1 tablet by mouth 3 (three) times daily.  . polyethylene glycol (MIRALAX / GLYCOLAX) packet Take 17 g by mouth daily.  . Probiotic Product (PROBIOTIC DAILY PO) Take 1 capsule by mouth daily.  . rosuvastatin  (CRESTOR) 10 MG tablet Take 10 mg by mouth daily.  . sodium chloride 1 g tablet Take 3 g by mouth 3 (three) times daily.   Marland Kitchen spironolactone (ALDACTONE) 25 MG tablet Take 25 mg by mouth 3 (three) times a week.  Marland Kitchen UNABLE TO FIND Take 1 capsule by mouth 3 (three) times daily. Med Name: blood sugar harmony  . UNABLE TO FIND Take 1 capsule by mouth 3 (three) times daily. Med Name: Clear lungs  . vitamin B-12 (CYANOCOBALAMIN) 1000 MCG tablet Take 1,000 mcg by mouth daily.  . vitamin E 400 UNIT capsule Take 400 Units by mouth daily.     Allergies:   Ciprofloxacin, Codeine, and Other   Social History   Socioeconomic History  . Marital status: Married    Spouse name: Not on file  . Number of children: Not on file  . Years of education: Not on file  . Highest education level: Not on file  Occupational History  . Not on file  Tobacco Use  . Smoking status: Never Smoker  . Smokeless tobacco: Never Used  Vaping Use  . Vaping Use: Never used  Substance and Sexual Activity  . Alcohol use: Not on file  . Drug use: Not on file  . Sexual activity: Not on file  Other Topics Concern  . Not on file  Social History Narrative  . Not on file   Social Determinants of Health   Financial Resource Strain:   . Difficulty of Paying Living Expenses:   Food Insecurity:   . Worried About Charity fundraiser in the Last Year:   . Arboriculturist in the Last Year:   Transportation Needs:   . Film/video editor (Medical):   Marland Kitchen Lack of Transportation (Non-Medical):   Physical Activity:   . Days of Exercise per Week:   . Minutes of Exercise per Session:   Stress:   . Feeling of Stress :   Social Connections:   . Frequency of Communication with Friends and Family:   . Frequency of Social Gatherings with Friends and Family:   . Attends Religious Services:   . Active Member of Clubs or Organizations:   . Attends Archivist Meetings:   Marland Kitchen Marital Status:      Family History: The  patient's family history includes Aneurysm in his father and maternal grandmother; Cancer in his maternal grandmother; Dementia in his mother; Heart attack in his father.  ROS:   Please see the history of present illness.    All other systems reviewed and are negative.  EKGs/Labs/Other Studies Reviewed:    The following studies were reviewed today: EVENT MONITOR REPORT:   Patient was monitored from 10/02/2019 to 10/16/2019. Indication:  Bradycardia Ordering physician:  Jenean Lindau, MD  Referring physician:        Jenean Lindau, MD    Baseline rhythm: Sinus  Minimum heart rate: 42 BPM.  Average heart rate: 57 BPM.  Maximal heart rate 122 BPM.  Atrial arrhythmia: Multiple brief atrial runs longest lasting 14 seconds with average heart rate of 108  Ventricular arrhythmia: None significant  Conduction abnormality: None significant  Symptoms: None   Conclusion:  Mildly abnormal event monitor with brief atrial runs as mentioned above.  Interpreting  cardiologist: Jenean Lindau, MD  Date: 11/07/2019 3:58 PM    Recent Labs: 06/05/2019: BUN 7; Creatinine, Ser 0.70; Hemoglobin 15.2; Platelets 301.0; Potassium 4.0; Sodium 127 02/13/2020: ALT 19  Recent Lipid Panel No results found for: CHOL, TRIG, HDL, CHOLHDL, VLDL, LDLCALC, LDLDIRECT  Physical Exam:    VS:  BP 134/60   Pulse 75   Ht 5' 6" (1.676 m)   Wt 237 lb 12.8 oz (107.9 kg)   SpO2 96%   BMI 38.38 kg/m     Wt Readings from Last 3 Encounters:  04/12/20 237 lb 12.8 oz (107.9 kg)  02/13/20 240 lb 12.8 oz (109.2 kg)  01/22/20 241 lb 10 oz (109.6 kg)     GEN: Patient is in no acute distress HEENT: Normal NECK: No JVD; No carotid bruits LYMPHATICS: No lymphadenopathy CARDIAC: Hear sounds regular, 2/6 systolic murmur at the apex. RESPIRATORY:  Clear to auscultation without rales, wheezing or rhonchi  ABDOMEN: Soft, non-tender, non-distended MUSCULOSKELETAL:  No edema; No  deformity  SKIN: Warm and dry NEUROLOGIC:  Alert and oriented x 3 PSYCHIATRIC:  Normal affect   Signed, Jenean Lindau, MD  04/12/2020 10:26 AM    [facility]

## 2020-04-12 NOTE — Telephone Encounter (Signed)
Spoke with patient. He stated that his daughter tested positive for COVID a few weeks ago and she will be finished with her quarantine on 6/18. He stated that the only symptom she had was a slight fever. She has not had any fevers in the last 3-5 days that he is aware of.   He is fully vaccinated. The event she is having this weekend will be outdoors. Everything will sanitized heavily.   He wants to know if MR would think it would be ok for him to attend this event.   MR, please advise. Thanks!

## 2020-04-12 NOTE — Telephone Encounter (Signed)
Patient contacted with Dr. Golden Pop recommendations. Patient is not planning to attend the party this weekend. He agrees with Dr. Chase Caller.

## 2020-04-12 NOTE — Patient Instructions (Signed)
Medication Instructions:  No medication changes. *If you need a refill on your cardiac medications before your next appointment, please call your pharmacy*   Lab Work: Your physician recommends that you have a BMET and TSH today in the office.  If you have labs (blood work) drawn today and your tests are completely normal, you will receive your results only by: Marland Kitchen MyChart Message (if you have MyChart) OR . A paper copy in the mail If you have any lab test that is abnormal or we need to change your treatment, we will call you to review the results.   Testing/Procedures: None ordered   Follow-Up: At [facility], you and your health needs are our priority.  As part of our continuing mission to provide you with exceptional heart care, we have created designated Provider Care Teams.  These Care Teams include your primary Cardiologist (physician) and Advanced Practice Providers (APPs -  Physician Assistants and Nurse Practitioners) who all work together to provide you with the care you need, when you need it.  We recommend signing up for the patient portal called "MyChart".  Sign up information is provided on this After Visit Summary.  MyChart is used to connect with patients for Virtual Visits (Telemedicine).  Patients are able to view lab/test results, encounter notes, upcoming appointments, etc.  Non-urgent messages can be sent to your provider as well.   To learn more about what you can do with MyChart, go to [email or web address].    Your next appointment:   6 month(s)  The format for your next appointment:   In Person  Provider:   Jyl Heinz, MD   Other Instructions NA

## 2020-04-12 NOTE — Addendum Note (Signed)
Addended by: Truddie Hidden on: 04/12/2020 10:56 AM   Modules accepted: Orders

## 2020-04-14 ENCOUNTER — Telehealth: Payer: Self-pay

## 2020-04-14 NOTE — Telephone Encounter (Signed)
-----  Message from Jenean Lindau, MD sent at 04/13/2020  9:05 AM EDT ----- Sodium level is stable.  The results of the study is unremarkable. Please inform patient. I will discuss in detail at next appointment. Cc  primary care/referring physician Jenean Lindau, MD 04/13/2020 9:05 AM

## 2020-04-14 NOTE — Telephone Encounter (Signed)
Spoke with patient regarding results and recommendation.  Patient verbalizes understanding and is agreeable to plan of care. Advised patient to call back with any issues or concerns.

## 2020-04-18 DIAGNOSIS — J84112 Idiopathic pulmonary fibrosis: Secondary | ICD-10-CM | POA: Diagnosis not present

## 2020-04-24 DIAGNOSIS — G4733 Obstructive sleep apnea (adult) (pediatric): Secondary | ICD-10-CM | POA: Diagnosis not present

## 2020-04-24 DIAGNOSIS — J84112 Idiopathic pulmonary fibrosis: Secondary | ICD-10-CM | POA: Diagnosis not present

## 2020-04-28 DIAGNOSIS — I10 Essential (primary) hypertension: Secondary | ICD-10-CM | POA: Diagnosis not present

## 2020-04-28 DIAGNOSIS — E785 Hyperlipidemia, unspecified: Secondary | ICD-10-CM | POA: Diagnosis not present

## 2020-05-04 ENCOUNTER — Telehealth: Payer: Self-pay | Admitting: Internal Medicine

## 2020-05-04 ENCOUNTER — Encounter: Payer: Self-pay | Admitting: Internal Medicine

## 2020-05-04 NOTE — Telephone Encounter (Signed)
05/04/2020  Would recommend the patient start using:  Start nasal saline rinses twice daily Use distilled water Shake well Get bottle lukewarm like a baby bottle  Can also use 1 spray each nostril of fluticasone nasal spray after using saline rinses  These can be obtained over-the-counter  Please also route message to TP NP as she last saw the patient in April/2021 she may have additional thoughts or recommendations.  Given the timeframe that we would be able to see the patient in office it may be beneficial for him to also seek follow-up from primary care if he needs acute evaluation of a suspected sinusitis.  Wyn Quaker, FNP

## 2020-05-04 NOTE — Telephone Encounter (Signed)
Spoke with pt, aware of recs.  States he wishes to try the saline rinses and flonase before following up with his PCP.  I also offered pt a virtual visit on Thursday with Aaron Edelman (next available OV), but pt declined, stating he wishes to try Brian's recs first.  Routing to TP at Brian's request to see if she has any additional recs.

## 2020-05-04 NOTE — Telephone Encounter (Signed)
Spoke with the pt  He states having "head stopped up" and cough with yellow sputum x 2-3 days  Temp today 98.2  No chills or body aches No increased SOB or wheezing  He denies any sore throat, ear pain, trouble swallowing  He states feels that he has a sinus infection  MR not on the schedule today so will send to APP of the day  Please advise thanks! Allergies  Allergen Reactions  . Ciprofloxacin Other (See Comments)    colitis  . Codeine   . Other     Seafood allergy

## 2020-05-04 NOTE — Telephone Encounter (Signed)
Called and spoke with pt's wife letting her know that TP agreed with recommendations stated by Aaron Edelman and she verbalized understanding.nothing further needed.

## 2020-05-04 NOTE — Telephone Encounter (Signed)
That sounds like a good plan  Please contact office for sooner follow up if symptoms do not improve or worsen or seek emergency care

## 2020-05-18 DIAGNOSIS — J84112 Idiopathic pulmonary fibrosis: Secondary | ICD-10-CM | POA: Diagnosis not present

## 2020-05-19 ENCOUNTER — Other Ambulatory Visit: Payer: Self-pay

## 2020-05-19 ENCOUNTER — Encounter: Payer: Self-pay | Admitting: Internal Medicine

## 2020-05-19 ENCOUNTER — Ambulatory Visit: Payer: PPO | Admitting: Internal Medicine

## 2020-05-19 VITALS — BP 142/78 | HR 68 | Temp 98.2°F | Ht 66.0 in | Wt 243.6 lb

## 2020-05-19 DIAGNOSIS — Z5181 Encounter for therapeutic drug level monitoring: Secondary | ICD-10-CM | POA: Diagnosis not present

## 2020-05-19 DIAGNOSIS — J209 Acute bronchitis, unspecified: Secondary | ICD-10-CM | POA: Diagnosis not present

## 2020-05-19 DIAGNOSIS — R053 Chronic cough: Secondary | ICD-10-CM

## 2020-05-19 DIAGNOSIS — J84112 Idiopathic pulmonary fibrosis: Secondary | ICD-10-CM | POA: Diagnosis not present

## 2020-05-19 DIAGNOSIS — R05 Cough: Secondary | ICD-10-CM | POA: Diagnosis not present

## 2020-05-19 LAB — HEPATIC FUNCTION PANEL
ALT: 19 U/L (ref 0–53)
AST: 27 U/L (ref 0–37)
Albumin: 4.1 g/dL (ref 3.5–5.2)
Alkaline Phosphatase: 71 U/L (ref 39–117)
Bilirubin, Direct: 0.1 mg/dL (ref 0.0–0.3)
Total Bilirubin: 0.4 mg/dL (ref 0.2–1.2)
Total Protein: 7.8 g/dL (ref 6.0–8.3)

## 2020-05-19 MED ORDER — PREDNISONE 10 MG PO TABS
ORAL_TABLET | ORAL | 0 refills | Status: DC
Start: 2020-05-19 — End: 2020-06-09

## 2020-05-19 MED ORDER — DOXYCYCLINE HYCLATE 100 MG PO TABS
100.0000 mg | ORAL_TABLET | Freq: Two times a day (BID) | ORAL | 0 refills | Status: AC
Start: 2020-05-19 — End: 2020-05-24

## 2020-05-19 MED ORDER — GABAPENTIN 300 MG PO CAPS: 300.0000 mg | ORAL_CAPSULE | Freq: Every day | ORAL | 0 refills | Status: DC

## 2020-05-19 NOTE — Addendum Note (Signed)
Addended by: Merrilee Seashore on: 05/19/2020 11:05 AM   Modules accepted: Orders

## 2020-05-19 NOTE — Addendum Note (Signed)
Addended by: Suzzanne Cloud E on: 05/19/2020 11:13 AM   Modules accepted: Orders

## 2020-05-19 NOTE — Patient Instructions (Addendum)
ICD-10-CM   1. Idiopathic pulmonary fibrosis ([place])  J84.112   2. Chronic cough  R05   3. Acute bronchitis, unspecified organism  J20.9   4. Therapeutic drug monitoring  Z51.81    My concern is that pulmonary fibrosis worse In addition he might be having cough neuropathy and also ILD flareup We are running out of options  Plan -Continue oxygen at rest and with exertion but during exertion crank it up to 4 L -Continue Esbriet per protocol -Check liver function test today - Take doxycycline 172m po twice daily x 5 days; take after meals and avoid sunlight - Please take Take prednisone 453monce daily x 3 days, then 3059mnce daily x 3 days, then 56m37mce daily x 3 days, then prednisone 10mg80me daily  x 3 days and and then prednisone 5 mg/day x 3 days and stop stop -Try gabapentin 300 mg daily at night for cough   -It is making you too sleepy or groggy let [place] -Advised do not intubation and no CPR if you ever call EMS -Monitor your pulse ox and always keep it above 86%  Follow-up -15-minute phone visit with nurse practitioner in 2-3 weeks to report progress -30-minute follow-up in 8 weeks with Dr. RamasChase Caller-to-face

## 2020-05-19 NOTE — Progress Notes (Signed)
IOV 03/05/2018  Chief Complaint  Patient presents with  . Consult    Referral per MD Helene Kelp, Dec 2018 had sinus infection and still has cough, hx of mild bronchiectasis, later was given symbicort which patient believes is making him more SOB.      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  He lives in [facility] and presents with his wife for evaluation of pulmonary fibrosis to the ILD clinic.  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  During this time he was not exposed to any metal dust but he was exposed to mold and damp environments intermittently but not always.  He denies any mold or mildew in the house.  He tells me that in December 2018 he had a sinus infection and since then has a lingering cough that never really went away.  It looks like from the primary care physician he saw dentist and from there he had imaging and this finally all resulted in a high-resolution CT chest January 21, 2018 that I personally visualized.  The official report is that it is indeterminate for UIP.  In my personal visualization there is bilateral bibasal symmetric disease with definite of craniocaudal gradient.  The septal thickening and also cylindrical bronchiectasis and peripheral bronchiec bronchiolectasis.  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  He does not have much of her shortness of breath but then he is morbidly obese and has balance issues and is quite limited.  He was seen by a local pulmonologist will give him a 2-5-year survival rate and therefore he is frustrated.  He was started on Symbicort which actually made things worse for him.  [place] pulmonary interstitial lung disease questionnaire  Symptoms: Since dec 2019. Some sputjm +. Better since started. Insidious dyspnea x 4 months. Same since onset. No episodic  dyspnea. lLeve 4 dyspnea walking wiuth peers and level 5 walking up hill/stairs. Level 1 for dressing, Level 2 fo rshopping and mpwing lawn  ROS: chronic balance issues affecting mobiluity.  In association with this he has fatigue for the last 3 years.  He has dry mouth for the last several years associated with some dysphagia for the last 10 years heartburn and acid reflux for several years.  Daytime set sleepiness for the last several years but denies any oral ulcers or rash or weight loss or recurrent fever or arthralgia  Past medical history: Denies any asthma COPD or heart failure rheumatoid arthritis or scleroderma or systemic lupus erythematosus or polymyositis or Sjogren's but is positive for sleep apnea and hiatal hernia.  Negative for pulmonary hypertension diabetes thyroid disease stroke seizures hepatitis tuberculosis kidney disease blood clots heart disease other than murmur pleurisy  Family history of pulmonary disease: Positive for asthma in the mother but nobody with pulmonary fibrosis  Personal exposure history: He has never smoked cigarettes did smoke cigars for 3 years.  [place]  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  During this time there is intermittent  mold exposure  Pulmonary toxicity history denies a laundry list of pulmonary toxicity medications    OV 04/11/2018  Chief Complaint  Patient presents with  . Follow-up    PFT performed today.  Pt is still coughing with occ. white mucus and pt also states he believes SOB is worse and is becoming SOB more easily.    Follow-up interstitial lung disease work-up  Symptoms: In the interim no new symptoms.  He does admit to having  chronic arthralgia for many decades after sheetrock fell on him.  He also has joint stiffness especially in the hands but he does not know if it is early morning stiffness.  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 Lorin Picket.  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  His autoimmune and vascular does panel shows trace positivity for ANCA and cyclic citrulline peptide.    OV 06/04/2018   Chief Complaint  Patient presents with  . Follow-up    Pt states things have been about the same since last visit.   FU ILD - ccp and atypical p-anca trace positive May 2019. HP panel negative.  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 Lung function: Severity -FVC 63% and DLCO 58% showing moderate ILD. Rheum evaluation - 04/25/18 - no evidence of RA (Dr Trudie Reed)  Here with his wife to discuss test results. In the interim he saw Dr. Gavin Pound on 04/25/2018. She is of rheumatology. Clinically she did not find any evidence of systemic rheumatoid arthritis or vasculitis. Therefore he is here for decision making regarding his ILD. In the interim there are no new issues.  OV 07/16/2018  Subjective:  Patient ID: Stephen [name], male , DOB: [birth year] , age 69 y.o. , MRN: [id number] , ADDRESS: [address] [place] [address]   07/16/2018 -   Chief Complaint  Patient presents with  . Follow-up    Pt started OFEV 8/11 and so far denies any complaints.    FU ILD - ccp and atypical p-anca trace positive May 2019. HP panel negative.  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 Lung function: Severity -FVC 63% and DLCO 58% showing moderate ILD. Rheum evaluation - 04/25/18 - no evidence of RA (Dr Trudie Reed). Dx of IPF given 06/04/18. STarted ofev 8/11/9    HPI ZETH BUDAY 69 y.o. - presents for follow-up of his idiopathic pulmonary fibrosis. Diagnoses given 06/04/2018. He is started on  Ofev 06/09/2018. He is here with his wife. He says he is tolerating the Ofev just fine. No GI issues. He gets support from the Child psychotherapist Clarene Critchley. He will have a liver function test today. He deferred flu shot because he will have it with his primary care physician. He told me that he is planning to join the pulmonary fibrosis foundation patient support group. Next meeting is 07/25/2018 Thursday 6 PM at [facility]. He is also willing to join research trials.He has mobility issues on account of his obesity and cane use. He says he cannot do a treadmill or walk long distances.   He has chronic tinnitus and apparently this is from working in [employer] base during [place] War although he was never deployed overseas. He started getting VA eligibility. He asked [place] to fill forms. ROS - per HPI  OV 09/03/2018  Subjective:  Patient  ID: Stephen [name], male , DOB: 02/08/1951 , age 31 y.o. , MRN: [id number] , ADDRESS: [address] [place] [address]   09/03/2018 -   Chief Complaint  Patient presents with  . Follow-up    Doing well at this time coughing up white mucus     FU ILD - ccp and atypical p-anca trace positive May 2019. HP panel negative.  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 Lung function: Severity -FVC 63% and DLCO 58% showing moderate ILD. Rheum evaluation - 04/25/18 - no evidence of RA (Dr Trudie Reed). Dx of IPF given 06/04/18. STarted ofev 8/11/9   HPI JAGER KOSKA 69 y.o. -returns for follow-up of his IPF.  He has been on nintedanib since mid August 2019.  He is here with his wife.  So far is tolerating the nintedanib just fine.  He says that his baseline constipation still continues and he needs both MiraLAX and Citrucel.  However what he notices that when he has relief of constipation it is diarrhea.  He says he is not able to come down on the laxative dose because otherwise the constipation returns.  He feels the 05 is not causing any diarrhea.  Is no  abdominal pain nausea vomiting.  In terms of shortness of breath is stable.  He tried to have pulmonary function test as follow-up like a few weeks ago but he started coughing and did not produce enough spirometry's.  All we have the symptoms which is stable.  He is up-to-date with his flu shot.  He is interested in research protocols.  He uses a cane normally.      OV 12/17/2018  Subjective:  Patient ID: Stephen [name], male , DOB: [birth year] , age 55 y.o. , MRN: [id number] , ADDRESS: [address] [place] [address]   12/17/2018 -   Chief Complaint  Patient presents with  . Follow-up    PFT performed today but pt was unable to perform DLCO due to coughing. Pt states SOB is about the same. Denies any CP or chest tightness.    FU ILD - ccp and atypical p-anca- ALL trace positive May 2019. HP panel negative.  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 Lung function: Severity -FVC 63% and DLCO 58% showing moderate ILD. Rheum evaluation - 04/25/18 - no evidence of RA (Dr Trudie Reed). Dx of IPF given 06/04/18. STarted ofev 8/11/9   HPI ALPHEUS STIFF 69 y.o. -IPF follow-up.  He presents with his wife as usual.  He tells me that starting January 2020 nintedanib started causing significant diarrhea.  By the third week of January 2020 he reduced himself to 1 tablet once daily at 150 mg.  With this there is no diarrhea.  He says that at the full dose he would take Imodium for diarrhea and then he would get constipated for several days.  He would follow this with a laxative and then he would have explosive diarrhea.  He said the symptoms became unmanageable.  In terms of his IPF itself he is stable.  His other main issues worsening chronic cough.  He has had chronic cough for b for several years predating IPF diagnosis.  He has been on ACE inhibitor's but it appears that lisinopril got rotated to benazepril and then to losartan but the cough never improved.  So he got switch back to benazepril.  He says that his  primary care team is aware that all these agents  can cause cough but the emphasis was on controlling the blood pressure.  He says the cough is severe.  Mostly in the daytime very rarely at night.  There is a laryngeal hoarse quality to his cough.  Noticed results on fish oil and carvedilol.  We discussed about neuropathic treatment for cough but he is already on amitriptyline ROS - per HPI   OV 06/30/2019  Subjective:  Patient ID: Stephen [name], male , DOB: 1951/04/08 , age 24 y.o. , MRN: [id number] , ADDRESS: [address] [place] [address]   06/30/2019 -   Chief Complaint  Patient presents with  . IPF (idiopathic pulmonary fibrosis)    Does not feel the breathing is any different than last visit. Still using Ofev, but still having diarrhea with it.     FU ILD - ccp and atypical p-anca- ALL trace positive May 2019. HP panel negative.  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 Lung function: Severity -FVC 63% and DLCO 58% showing moderate ILD. Rheum evaluation - 04/25/18 - no evidence of RA (Dr Trudie Reed). Dx of IPF given 06/04/18. STarted ofev 06/09/18  HPI TAMIM SKOG 69 y.o. -presents for follow-up with his wife.  He has a diagnosis of IPF.  He has been on nintedanib since August 2019.  He preferred to take nintedanib initially because of his preference to have diarrhea.  At this point in time he tells me that he is having progressive dyspnea.  Early in the spring 2020 he noticed that he could not walk back from his office to the front door.  He could do this in 2019.  He also has had difficulty mowing the lawn with the more.  In addition is having significant diarrhea from the nintedanib.  It is severe despite agents.  He is willing to change the drug.  In between he saw the nurse practitioner he had a high-resolution CT chest in August 2020.  He has mildly progressive disease even compared to March 2020.  Walking desaturation test and symptom scores show evidence of progression and documented  below.       OV 08/27/2019  Subjective:  Patient ID: Stephen [name], male , DOB: January 23, 1951 , age 86 y.o. , MRN: [id number] , ADDRESS: [address] [place] [address]  FU IPF  - ccp and atypical p-anca- ALL trace positive May 2019. HP panel negative.  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 Lung function: Severity -FVC 63% and DLCO 58% showing moderate ILD. Rheum evaluation - 04/25/18 - no evidence of RA (Dr Trudie Reed). Dx of IPF given 06/04/18. STarted ofev 8/11/9. STopped Aug 2020 due to progression and diarrhea. STrted Esbriet -July 25, 2019   08/27/2019 -   Chief Complaint  Patient presents with  . Follow-up    Patient reports that his breathing is about the same. He has sob with exertion and productive cough.      HPI JORMA TASSINARI 69 y.o. -presents with his wife for follow-up.  He tells me that Jacklynn Bue was started on July 25, 2019.  He took 3 weeks for it to come.  During this time he was off nintedanib and his diarrhea resolved. Wife tells me that he has been completely sedentary because of shortness of breath.  He feels the shortness of breath is getting worse.  He is also gained weight because of his sedentary lifestyle.  He is already morbidly obese.  He has pedal edema for which  she wears stockings.  He is not using oxygen at night because he is never needed it.  He is very concerned about the shortness of breath getting worse.  In fact it is worse even since his last visit in August 2020.  This is documented in the questionnaire below.  Also seen in the pulmonary function test.  His CT scan of the chest August 2020 shows worsening within 5 months.  I reviewed this with him.  He tells me that even taking a shower makes him desaturate although today he did not desaturate here in with a forehead probe to 88%.  However he did not do a 6-minute walk test.  He feels he would benefit from oxygen   IMPRESSION: HRCT Augu 2020 compared to March 2020 1. Spectrum of findings  compatible with basilar predominant fibrotic interstitial lung disease with probable early honeycombing at the right costophrenic angle. Slight interval progression. Findings are consistent with UIP per consensus guidelines: Diagnosis of Idiopathic Pulmonary Fibrosis: An Official ATS/ERS/JRS/ALAT Clinical Practice Guideline. [place], Iss 5, [phone], Jun 30 2017. 2. Mild mediastinal lymphadenopathy is stable, compatible with benign reactive adenopathy. 3. Three-vessel coronary atherosclerosis.  Aortic Atherosclerosis (ICD10-I70.0).   Electronically Signed   By: Ilona Sorrel M.D.   On: 06/28/2019 16:35  ROS - per HPI   OV 09/29/2019  Subjective:  Patient ID: Stephen [name], male , DOB: 04/23/1951 , age 63 y.o. , MRN: [id number] , ADDRESS: [address] [place] [address]   FU IPF  -trace positive ccp and atypical p-anca- ALL trace positive May 2019 (repeat CCP - October 2020]. HP panel negative.  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 Lung function: Severity -FVC 63% and DLCO 58% showing moderate ILD. Rheum evaluation - 04/25/18 - no evidence of RA (Dr Trudie Reed). Dx of IPF given 06/04/18. STarted ofev 8/11/9. STopped Aug 2020 due to progression and diarrhea. STrted Esbriet -July 25, 2019   Associated sleep apnea present   Last CT 06/27/2019 Low risk cardiac stress test April 2019.   09/29/2019 -   Chief Complaint  Patient presents with  . Follow-up    Pt states his breathing has become worse since last visit and also has a worsening cough and is coughing up dark green phlegm which he has had for awhile now. Pt is on 2L O2 24/7.      HPI RAMELO OETKEN 69 y.o. -he is now attending pulmonary rehabilitation.  On September 16, 2019 he did have a 6-minute walk test.  He did desaturate to 80% from 96%.  He dropped his pulse ox to 86% at the second minut.  He did correct with 2 L of oxygen.  E also in the interim he did have overnight oxygen study  and we have recommended 2 L nasal cannula oxygen with the CPAP.  He is now wearing that.  Of note, during the overnight oxygen study his heart rate was 48-50.  Today he also had a 6-minute walk test at our office on 2 L oxygen and with this his pulse ox did not drop.  Overall he is reporting stability.  This can be demonstrated by the symptom questionnaire below.  He does have significant amount of shortness of breath despite wearing his 2 L.  Even though the 2 L helps his dyspnea he still says the dyspnea is significant.  He still has chronic significant pedal edema.  He continues to be morbidly  obese.  We discussed his diet.  His wife states that he eats all the time and cannot stop eating.  Apparently he goes through a carton of ice cream every few days.  He says that he will not be able to control his dietary indiscretion.  He is not interested in lung transplant.  He is enjoying pulmonary rehabilitation.  Given his diastolic dysfunction I did refer him to his cardiologist but he says he does not have an appointment.    02/13/2020 Follow up : ILD  Patient presents for a 54-monthfollow-up.  Patient says overall breathing is doing about the same.  He has had no flare of his cough or shortness of breath.  Says his activity level is about at baseline.  Says he would like to be more active has been try to do some yard work however is very hard for him to carry his oxygen tanks.  Would like a portable oxygen concentrator.  Walk test in the office shows desaturation below 88% on room air.  Requires 2 L of oxygen to keep O2 saturations greater than 88%.  Patient gets short of breath with heavy activity.  Has to stop and rest several times.  He remains on Esbriet.  Denies any nausea vomiting diarrhea.    OV 05/19/2020   Subjective:  Patient ID: RLevin [name] male , DOB: [birth year] age [age]y.o. years. , MRN: [id number]  ADDRESS: [address] [place] [address]  HRonita Hipps MD Providers : Treatment Team:   Attending Provider: RBrand Males MD  FU IPF  -trace positive ccp and atypical p-anca- ALL trace positive May 2019 (repeat CCP - October 2020]. HP panel negative.  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 Lung function: Severity -FVC 63% and DLCO 58% showing moderate ILD. Rheum evaluation - 04/25/18 - no evidence of RA (Dr HTrudie Reed. Dx of IPF given 06/04/18. STarted ofev 8/11/9. STopped Aug 2020 due to progression and diarrhea. STrted Esbriet -July 25, 2019  Chief Complaint  Patient presents with  . Follow-up    Productive cough with brownish-yellow phlegm       HPI RSEBASTION [id number].o. -presents for follow-up.  Last seen in #2020 as standard of care.  After that he was on the GOlastudy which prematurely ended.  He continues in his bed without any problems.  He has not lost any weight.  However he is having progressive shortness of breath and progressive cough.  Particularly worse in the last 1 month.  In the last few days the cough is even more worse.  Last night he was coughing all night.  He is not able to talk without coughing.  When he takes a shower with 2 L nasal cannula he desaturates to 84%.  When he walks in the mall with 2 L nasal cannula he desaturates to 84%.  He has been asked to uptitrate his oxygen to 4 L with exertion but is not done that.  He is also got some brown-yellow sputum for the last 1 month.  There is no wheezing.  Hycodan cough syrup is not helping.  He is frustrated by all this.  And so is his wife.  There is no edema chest pain.  He easily desaturates. SYMPTOM SCALE - ILD 12/17/2018  06/30/2019 Stop ofev 08/27/2019 esbriet since July 25, 2019 09/29/2019 esbriet  O2 use RA ra ra  2 L  Shortness of Breath 0 -> 5 scale with 5 being  worst (score 6 If unable to do)     At rest _0 Simple tasks - showers, clothes change, eating, shaving _1 Household (dishes, doing bed, laundry) x 0 x x  Walking level at own pace 1 1 x 3  Walking  keeping up with others of same age _2 Walking up Stairs _3 Walking up Hill _4 Total (40 - 48) Dyspnea Score _5 How bad is your cough? _6 How bad is your fatigue _7 Simple office walk 185 feet x  3 laps goal with forehead probe 03/05/2018  06/04/2018   09/03/2018  12/17/2018  06/30/2019 Stop ofev Start esbriet 08/27/2019  April 20210 05/19/2020   O2 used _8  Room air Needs 2L at rest, 3L exertion 92  Number laps completed All 3 laops All 3 laps all3 3 x 185 3     Comments about pace Walks very slow due to baseline balance issue "40% of balance only due to remote trauma  nomral mod pace using cane Slow with cane Slow with ane Slow with cane    Resting Pulse Ox/HR 100% and 55/min 100% and 57/min 100% and 59/min 100% and 61/min 97% and 56/min 98% and 65/min  92% ra at rest  Final Pulse Ox/HR 97% and 78/min 97% and 77/mi 98% and 76/min 95% and 91/min 91% and 87/min 92% and 94/mio    Desaturated </= 88% _9  no    Desaturated <= 3% points yes yes no Yes, 5 points Yes, 6 points Yes, 6 point    Got Tachycardic >/= 90/min no no no yes no yes    Symptoms at end of test No, denied dyspnea x none none none veryt dyspneic    Miscellaneous comments npne x x            Results for LES, LONGMORE (MRN [id number]) as of 08/27/2019 10:10  Ref. Range 04/11/2018 10:31 08/16/2018 15:35 10/03/2018 14:46 ofev 08/27/2019 08:51 esbriet  FVC-Pre Latest Units: L 2.57 2.53 2.42 2.18  FVC-%Pred-Pre Latest Units: % 63 67 65 56   Results for HIRAN, LEARD (MRN [id number]) as of 08/27/2019 10:10  Ref. Range 04/11/2018 10:31 08/16/2018 15:35 10/03/2018 14:46 08/27/2019 08:51  DLCO unc Latest Units: ml/min/mmHg 16.68   13.67  DLCO unc % pred Latest Units: % 58   59       ROS - per HPI     has a past medical history of Allergy, Arthritis, GERD (gastroesophageal reflux disease), Heart murmur, Hiatal hernia,  Hyperlipidemia, Hypertension, Sleep apnea, and Vertigo.   reports that he has never smoked. He has never used smokeless tobacco.  Past Surgical History:  Procedure Laterality Date  . CARDIAC CATHETERIZATION  03/22/2001  . LEG SURGERY Bilateral 04/2001    Allergies  Allergen Reactions  . Ciprofloxacin Other (See Comments)    colitis  . Codeine   . Other     Seafood allergy    Immunization History  Administered Date(s) Administered  . Fluad Quad(high Dose 65+) 06/30/2019  . Influenza, High Dose Seasonal PF 08/28/2017, 07/19/2018  . PFIZER SARS-COV-2 Vaccination 11/16/2019, 12/06/2019  . Pneumococcal Conjugate-13 08/25/2016  . Pneumococcal Polysaccharide-23 11/18/2007    Family History  Problem Relation Age of Onset  .  Dementia Mother   . Heart attack Father   . Aneurysm Father   . Cancer Maternal Grandmother   . Aneurysm Maternal Grandmother      Current Outpatient Medications:  .  amitriptyline (ELAVIL) 75 MG tablet, Take 75 mg by mouth at bedtime., Disp: , Rfl:  .  amLODipine (NORVASC) 10 MG tablet, Take 10 mg by mouth daily., Disp: , Rfl:  .  aspirin EC 81 MG tablet, Take 81 mg by mouth daily., Disp: , Rfl:  .  calcium carbonate (OSCAL) 1500 (600 Ca) MG TABS tablet, Take by mouth 2 (two) times daily with a meal., Disp: , Rfl:  .  calcium carbonate (TUMS - DOSED IN MG ELEMENTAL CALCIUM) 500 MG chewable tablet, Chew 1 tablet by mouth daily., Disp: , Rfl:  .  cholecalciferol (VITAMIN D) 1000 units tablet, Take 1,000 Units by mouth daily., Disp: , Rfl:  .  cloNIDine (CATAPRES) 0.3 MG tablet, Take 0.3 mg by mouth daily after supper. , Disp: , Rfl:  .  dicyclomine (BENTYL) 10 MG capsule, Take 10 mg by mouth 3 (three) times daily before meals. , Disp: , Rfl:  .  HYDROcodone-homatropine (HYCODAN) 5-1.5 MG/5ML syrup, Take 5 mLs by mouth 3 (three) times daily as needed for cough., Disp: 450 mL, Rfl: 0 .  lansoprazole (PREVACID) 30 MG capsule, Take 30 mg by mouth daily at 12  noon., Disp: , Rfl:  .  Methylcellulose, Laxative, (CITRUCEL PO), Take 1 Scoop by mouth., Disp: , Rfl:  .  metoprolol tartrate (LOPRESSOR) 50 MG tablet, Take 1 tablet by mouth 2 (two) times daily., Disp: , Rfl:  .  montelukast (SINGULAIR) 10 MG tablet, Take 10 mg by mouth at bedtime., Disp: , Rfl:  .  Multiple Vitamin (MULTIVITAMIN WITH MINERALS) TABS tablet, Take 1 tablet by mouth daily., Disp: , Rfl:  .  nabumetone (RELAFEN) 750 MG tablet, Take 750 mg by mouth 2 (two) times daily., Disp: , Rfl:  .  olmesartan (BENICAR) 20 MG tablet, Take 1 tablet by mouth daily., Disp: , Rfl:  .  Pirfenidone (ESBRIET) 267 MG CAPS, Take 801 mg by mouth 3 (three) times daily. Takes 3 caps three times daily , Disp: , Rfl:  .  Pirfenidone (ESBRIET) 801 MG TABS, Take 1 tablet by mouth 3 (three) times daily., Disp: 270 tablet, Rfl: 0 .  polyethylene glycol (MIRALAX / GLYCOLAX) packet, Take 17 g by mouth daily., Disp: , Rfl:  .  Probiotic Product (PROBIOTIC DAILY PO), Take 1 capsule by mouth daily., Disp: , Rfl:  .  rosuvastatin (CRESTOR) 10 MG tablet, Take 10 mg by mouth daily., Disp: , Rfl:  .  sodium chloride 1 g tablet, Take 3 g by mouth 3 (three) times daily. , Disp: , Rfl:  .  spironolactone (ALDACTONE) 25 MG tablet, Take 25 mg by mouth 3 (three) times a week., Disp: , Rfl:  .  UNABLE TO FIND, Take 1 capsule by mouth 3 (three) times daily. Med Name: blood sugar harmony, Disp: , Rfl:  .  UNABLE TO FIND, Take 1 capsule by mouth 3 (three) times daily. Med Name: Clear lungs, Disp: , Rfl:  .  vitamin B-12 (CYANOCOBALAMIN) 1000 MCG tablet, Take 1,000 mcg by mouth daily., Disp: , Rfl:  .  vitamin E 400 UNIT capsule, Take 400 Units by mouth daily., Disp: , Rfl:       Objective:   Vitals:   05/19/20 1013  BP: (!) 142/78  Pulse: 68  Temp: 98.2 F (36.8  C)  TempSrc: Oral  SpO2: 97%  Weight: 243 lb 9.6 oz (110.5 kg)  Height: 5' 6" (1.676 m)    Estimated body mass index is 39.32 kg/m as calculated from the  following:   Height as of this encounter: 5' 6" (1.676 m).   Weight as of this encounter: 243 lb 9.6 oz (110.5 kg).  _0 @  Filed Weights   05/19/20 1013  Weight: 243 lb 9.6 oz (110.5 kg)     Physical Exam obese male with slightly more labored breathing than what I know of him.  He is not able to talk and be jovial as he used to.  Because this makes him cough.  Cough is a laryngeal quality.  Has bilateral bibasal crackles.  Uses a cane.  Has visceral obesity.  Is coughing all the time.       Assessment:       ICD-10-CM   1. Idiopathic pulmonary fibrosis ([place])  J84.112   2. Chronic cough  R05   3. Acute bronchitis, unspecified organism  J20.9   4. Therapeutic drug monitoring  Z51.81        Plan:     Patient Instructions     ICD-10-CM   1. Idiopathic pulmonary fibrosis ([place])  J84.112   2. Chronic cough  R05   3. Acute bronchitis, unspecified organism  J20.9   4. Therapeutic drug monitoring  Z51.81    My concern is that pulmonary fibrosis worse In addition he might be having cough neuropathy and also ILD flareup We are running out of options  Plan -Continue oxygen at rest and with exertion but during exertion crank it up to 4 L -Continue Esbriet per protocol -Check liver function test today - Take doxycycline 126m po twice daily x 5 days; take after meals and avoid sunlight - Please take Take prednisone 478monce daily x 3 days, then 3085mnce daily x 3 days, then 41m34mce daily x 3 days, then prednisone 10mg25me daily  x 3 days and and then prednisone 5 mg/day x 3 days and stop stop -Try gabapentin 300 mg daily at night for cough   -It is making you too sleepy or groggy let us kn[place] -Advised do not intubation and no CPR if you ever call EMS -Monitor your pulse ox and always keep it above 86%  Follow-up -15-minute phone visit with nurse practitioner in 2-3 weeks to report progress -30-minute follow-up in 8 weeks with Dr. RamasChase Callere-to-face       SIGNATURE    Dr. MuralBrand Males., F.C.C.P,  Pulmonary and Critical Care Medicine Staff Physician, [facility]  [facility]  Pulmonary [id number]: [phone] answer or between  15:00h - 7:00h: call [phone] Telephone: [phone]  10:52 AM 05/19/2020

## 2020-05-20 NOTE — Progress Notes (Signed)
LFT normal

## 2020-05-24 DIAGNOSIS — G4733 Obstructive sleep apnea (adult) (pediatric): Secondary | ICD-10-CM | POA: Diagnosis not present

## 2020-05-24 DIAGNOSIS — J84112 Idiopathic pulmonary fibrosis: Secondary | ICD-10-CM | POA: Diagnosis not present

## 2020-05-25 DIAGNOSIS — G4733 Obstructive sleep apnea (adult) (pediatric): Secondary | ICD-10-CM | POA: Diagnosis not present

## 2020-05-29 DIAGNOSIS — I10 Essential (primary) hypertension: Secondary | ICD-10-CM | POA: Diagnosis not present

## 2020-05-29 DIAGNOSIS — E785 Hyperlipidemia, unspecified: Secondary | ICD-10-CM | POA: Diagnosis not present

## 2020-06-09 ENCOUNTER — Other Ambulatory Visit: Payer: Self-pay

## 2020-06-09 ENCOUNTER — Encounter: Payer: Self-pay | Admitting: Primary Care

## 2020-06-09 ENCOUNTER — Ambulatory Visit (INDEPENDENT_AMBULATORY_CARE_PROVIDER_SITE_OTHER): Payer: PPO | Admitting: Primary Care

## 2020-06-09 DIAGNOSIS — J849 Interstitial pulmonary disease, unspecified: Secondary | ICD-10-CM

## 2020-06-09 DIAGNOSIS — J84112 Idiopathic pulmonary fibrosis: Secondary | ICD-10-CM

## 2020-06-09 MED ORDER — HYDROCODONE-HOMATROPINE 5-1.5 MG/5ML PO SYRP: 5.0000 mL | ORAL_SOLUTION | Freq: Three times a day (TID) | ORAL | 0 refills | Status: DC | PRN

## 2020-06-09 NOTE — Patient Instructions (Addendum)
Nice speaking with you today Stephen [name]  Recommendations: Continue Esbriet as prescribed Continue Gabapentin at bedtime  Continue Oxygen 3L at rest and 4L on exertion  Regular Mucinex 1,242m twice daily with full glass of water Use Flutter valve 5-10 breaths THREE times a day  Orders: CXR re: productive cough Sputum culture  Flutter valve   Rx: Refill Hydromet cough syrup   Follow-up:  As previously recommended 6 weeks with Dr. RChase Caller(needs apt set up)

## 2020-06-09 NOTE — Progress Notes (Signed)
Virtual Visit via Telephone Note  I connected with Stephen [name] on 06/09/20 at  9:00 AM EDT by telephone and verified that I am speaking with the correct person using two identifiers.  Location: Patient: Home Provider: Office   I discussed the limitations, risks, security and privacy concerns of performing an evaluation and management service by telephone and the availability of in person appointments. I also discussed with the patient that there may be a patient responsible charge related to this service. The patient expressed understanding and agreed to proceed.   History of Present Illness: 69 year old male, never smoked.  Past medical history significant for idiopathic pulmonary fibrosis, chronic respiratory failure with hypoxia, sleep apnea, obesity.  Patient of Dr. Chase Caller, last seen in office on 05/19/2020.  During that visit he reported increased shortness of breath and productive cough.  Treated for suspected exacerbation with doxycycline 100 mg twice daily x5 days and prednisone taper.  He was also given gabapentin 300 mg nightly for cough.  Maintained on Esbriet.  Previous [facility] pulmonary encounter: 05/19/20- Dr. Chase Caller HPI Stephen [name] 78 y.o. -presents for follow-up.  Last seen in #2020 as standard of care.  After that he was on the [place] study which prematurely ended.  He continues in his bed without any problems.  He has not lost any weight.  However he is having progressive shortness of breath and progressive cough.  Particularly worse in the last 1 month.  In the last few days the cough is even more worse.  Last night he was coughing all night.  He is not able to talk without coughing.  When he takes a shower with 2 L nasal cannula he desaturates to 84%.  When he walks in the mall with 2 L nasal cannula he desaturates to 84%.  He has been asked to uptitrate his oxygen to 4 L with exertion but is not done that.  He is also got some brown-yellow sputum for the last 1 month.   There is no wheezing.  Hycodan cough syrup is not helping.  He is frustrated by all this.  And so is his wife.  There is no edema chest pain.  He easily desaturates.   06/09/2020- Interim hx Patient contacted today for virtual telephone visit/2 to 3-week follow-up ILD. Cough may be a litle bit better but is still productive with yellow/brown mucus. Cough is not new. Hydromet medication does help. He did not notice any difference after completing doxycycline/prednisone course. No increase in oxygen demand. Denies fever, chest tightness, wheezing.   Observations/Objective:  Able to speak in full sentences; congested cough  95% O2 3L RA   Assessment and Plan:  ILD exacerbations - Continues to have chronic productive cough and dyspnea, no improvement with Doxycycline or prednisone - Continue Esbriet 816m TID, gabapentin QHS and supplemental oxygen  - Checking CXR and sputum culture - Start flutter valve three times a day  - Refill Hydromet, safe precautions reviewed  Follow Up Instructions:  - 6 weeks with MR   I discussed the assessment and treatment plan with the patient. The patient was provided an opportunity to ask questions and all were answered. The patient agreed with the plan and demonstrated an understanding of the instructions.   The patient was advised to call back or seek an in-person evaluation if the symptoms worsen or if the condition fails to improve as anticipated.  I provided 22 minutes of non-face-to-face time during this encounter.   ERee Shay  Volanda Napoleon, NP

## 2020-06-10 ENCOUNTER — Ambulatory Visit (INDEPENDENT_AMBULATORY_CARE_PROVIDER_SITE_OTHER): Payer: PPO

## 2020-06-10 ENCOUNTER — Other Ambulatory Visit: Payer: Self-pay

## 2020-06-10 DIAGNOSIS — J849 Interstitial pulmonary disease, unspecified: Secondary | ICD-10-CM

## 2020-06-10 DIAGNOSIS — J90 Pleural effusion, not elsewhere classified: Secondary | ICD-10-CM | POA: Diagnosis not present

## 2020-06-10 DIAGNOSIS — R05 Cough: Secondary | ICD-10-CM | POA: Diagnosis not present

## 2020-06-10 NOTE — Progress Notes (Signed)
Patient confirmed that he wishes to be a DNR. Form completed and signed. Confirmed patients name and date of birth. Copy made for chart.

## 2020-06-11 NOTE — Progress Notes (Signed)
Please let patient know CXR was consistent with chronic ILD. NO acute process

## 2020-06-15 ENCOUNTER — Encounter: Payer: Self-pay | Admitting: Internal Medicine

## 2020-06-15 ENCOUNTER — Ambulatory Visit: Payer: PPO | Admitting: Internal Medicine

## 2020-06-15 ENCOUNTER — Other Ambulatory Visit: Payer: Self-pay

## 2020-06-15 VITALS — BP 130/70 | HR 60 | Ht 66.0 in | Wt 241.6 lb

## 2020-06-15 DIAGNOSIS — R29898 Other symptoms and signs involving the musculoskeletal system: Secondary | ICD-10-CM | POA: Diagnosis not present

## 2020-06-15 DIAGNOSIS — J849 Interstitial pulmonary disease, unspecified: Secondary | ICD-10-CM | POA: Diagnosis not present

## 2020-06-15 DIAGNOSIS — J84112 Idiopathic pulmonary fibrosis: Secondary | ICD-10-CM

## 2020-06-15 DIAGNOSIS — R05 Cough: Secondary | ICD-10-CM

## 2020-06-15 DIAGNOSIS — J9611 Chronic respiratory failure with hypoxia: Secondary | ICD-10-CM | POA: Diagnosis not present

## 2020-06-15 DIAGNOSIS — R053 Chronic cough: Secondary | ICD-10-CM

## 2020-06-15 MED ORDER — SODIUM CHLORIDE 3 % IN NEBU
INHALATION_SOLUTION | Freq: Every day | RESPIRATORY_TRACT | 12 refills | Status: DC
Start: 2020-06-15 — End: 2020-07-19

## 2020-06-15 NOTE — Progress Notes (Signed)
IOV 03/05/2018  Chief Complaint  Patient presents with  . Consult    Referral per MD Helene Kelp, Dec 2018 had sinus infection and still has cough, hx of mild bronchiectasis, later was given symbicort which patient believes is making him more SOB.      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  He lives in [facility] and presents with his wife for evaluation of pulmonary fibrosis to the ILD clinic.  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  During this time he was not exposed to any metal dust but he was exposed to mold and damp environments intermittently but not always.  He denies any mold or mildew in the house.  He tells me that in December 2018 he had a sinus infection and since then has a lingering cough that never really went away.  It looks like from the primary care physician he saw dentist and from there he had imaging and this finally all resulted in a high-resolution CT chest January 21, 2018 that I personally visualized.  The official report is that it is indeterminate for UIP.  In my personal visualization there is bilateral bibasal symmetric disease with definite of craniocaudal gradient.  The septal thickening and also cylindrical bronchiectasis and peripheral bronchiec bronchiolectasis.  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  He does not have much of her shortness of breath but then he is morbidly obese and has balance issues and is quite limited.  He was seen by a local pulmonologist will give him a 2-5-year survival rate and therefore he is frustrated.  He was started on Symbicort which actually made things worse for him.  [place] pulmonary interstitial lung disease questionnaire  Symptoms: Since dec 2019. Some sputjm +. Better since started. Insidious dyspnea x 4 months. Same since onset. No episodic  dyspnea. lLeve 4 dyspnea walking wiuth peers and level 5 walking up hill/stairs. Level 1 for dressing, Level 2 fo rshopping and mpwing lawn  ROS: chronic balance issues affecting mobiluity.  In association with this he has fatigue for the last 3 years.  He has dry mouth for the last several years associated with some dysphagia for the last 10 years heartburn and acid reflux for several years.  Daytime set sleepiness for the last several years but denies any oral ulcers or rash or weight loss or recurrent fever or arthralgia  Past medical history: Denies any asthma COPD or heart failure rheumatoid arthritis or scleroderma or systemic lupus erythematosus or polymyositis or Sjogren's but is positive for sleep apnea and hiatal hernia.  Negative for pulmonary hypertension diabetes thyroid disease stroke seizures hepatitis tuberculosis kidney disease blood clots heart disease other than murmur pleurisy  Family history of pulmonary disease: Positive for asthma in the mother but nobody with pulmonary fibrosis  Personal exposure history: He has never smoked cigarettes did smoke cigars for 3 years.  [place]  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  During this time there is intermittent mold  exposure  Pulmonary toxicity history denies a laundry list of pulmonary toxicity medications    OV 04/11/2018  Chief Complaint  Patient presents with  . Follow-up    PFT performed today.  Pt is still coughing with occ. white mucus and pt also states he believes SOB is worse and is becoming SOB more easily.    Follow-up interstitial lung disease work-up  Symptoms: In the interim no new symptoms.  He does admit to having  chronic arthralgia for many decades after sheetrock fell on him.  He also has joint stiffness especially in the hands but he does not know if it is early morning stiffness.  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 Lorin Picket.  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  His autoimmune and vascular does panel shows trace positivity for ANCA and cyclic citrulline peptide.    OV 06/04/2018   Chief Complaint  Patient presents with  . Follow-up    Pt states things have been about the same since last visit.   FU ILD - ccp and atypical p-anca trace positive May 2019. HP panel negative.  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 Lung function: Severity -FVC 63% and DLCO 58% showing moderate ILD. Rheum evaluation - 04/25/18 - no evidence of RA (Dr Trudie Reed)  Here with his wife to discuss test results. In the interim he saw Dr. Gavin Pound on 04/25/2018. She is of rheumatology. Clinically she did not find any evidence of systemic rheumatoid arthritis or vasculitis. Therefore he is here for decision making regarding his ILD. In the interim there are no new issues.  OV 07/16/2018  Subjective:  Patient ID: Stephen [name], male , DOB: 1951-09-04 , age 60 y.o. , MRN: [id number] , ADDRESS: [address] [place] [address]   07/16/2018 -   Chief Complaint  Patient presents with  . Follow-up    Pt started OFEV 8/11 and so far denies any complaints.    FU ILD - ccp and atypical p-anca trace positive May 2019. HP panel negative.  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 Lung function: Severity -FVC 63% and DLCO 58% showing moderate ILD. Rheum evaluation - 04/25/18 - no evidence of RA (Dr Trudie Reed). Dx of IPF given 06/04/18. STarted ofev 8/11/9    HPI Stephen [name] 31 y.o. - presents for follow-up of his idiopathic pulmonary fibrosis. Diagnoses given 06/04/2018. He is started on  Ofev 06/09/2018. He is here with his wife. He says he is tolerating the Ofev just fine. No GI issues. He gets support from the Child psychotherapist Clarene Critchley. He will have a liver function test today. He deferred flu shot because he will have it with his primary care physician. He told me that he is planning to join the pulmonary fibrosis foundation patient support group. Next meeting is 07/25/2018 Thursday 6 PM at [facility]. He is also willing to join research trials.He has mobility issues on account of his obesity and cane use. He says he cannot do a treadmill or walk long distances.   He has chronic tinnitus and apparently this is from working in [employer] base during [place] War although he was never deployed overseas. He started getting VA eligibility. He asked [place] to fill forms. ROS - per HPI  OV 09/03/2018  Subjective:  Patient ID:  Stephen [name], male , DOB: 1951-07-08 , age 11 y.o. , MRN: [id number] , ADDRESS: [address] [place] [address]   09/03/2018 -   Chief Complaint  Patient presents with  . Follow-up    Doing well at this time coughing up white mucus     FU ILD - ccp and atypical p-anca trace positive May 2019. HP panel negative.  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 Lung function: Severity -FVC 63% and DLCO 58% showing moderate ILD. Rheum evaluation - 04/25/18 - no evidence of RA (Dr Trudie Reed). Dx of IPF given 06/04/18. STarted ofev 8/11/9   HPI Stephen [name] 38 y.o. -returns for follow-up of his IPF.  He has been on nintedanib since mid August 2019.  He is here with his wife.  So far is tolerating the nintedanib just fine.  He says that his baseline constipation still continues and he needs both MiraLAX and Citrucel.  However what he notices that when he has relief of constipation it is diarrhea.  He says he is not able to come down on the laxative dose because otherwise the constipation returns.  He feels the 05 is not causing any diarrhea.  Is no  abdominal pain nausea vomiting.  In terms of shortness of breath is stable.  He tried to have pulmonary function test as follow-up like a few weeks ago but he started coughing and did not produce enough spirometry's.  All we have the symptoms which is stable.  He is up-to-date with his flu shot.  He is interested in research protocols.  He uses a cane normally.      OV 12/17/2018  Subjective:  Patient ID: Stephen [name], male , DOB: 02/15/1951 , age 47 y.o. , MRN: [id number] , ADDRESS: [address] [place]  [address]   12/17/2018 -   Chief Complaint  Patient presents with  . Follow-up    PFT performed today but pt was unable to perform DLCO due to coughing. Pt states SOB is about the same. Denies any CP or chest tightness.    FU ILD - ccp and atypical p-anca- ALL trace positive May 2019. HP panel negative.  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 Lung function: Severity -FVC 63% and DLCO 58% showing moderate ILD. Rheum evaluation - 04/25/18 - no evidence of RA (Dr Trudie Reed). Dx of IPF given 06/04/18. STarted ofev 8/11/9   HPI Stephen [name] 63 y.o. -IPF follow-up.  He presents with his wife as usual.  He tells me that starting January 2020 nintedanib started causing significant diarrhea.  By the third week of January 2020 he reduced himself to 1 tablet once daily at 150 mg.  With this there is no diarrhea.  He says that at the full dose he would take Imodium for diarrhea and then he would get constipated for several days.  He would follow this with a laxative and then he would have explosive diarrhea.  He said the symptoms became unmanageable.  In terms of his IPF itself he is stable.  His other main issues worsening chronic cough.  He has had chronic cough for b for several years predating IPF diagnosis.  He has been on ACE inhibitor's but it appears that lisinopril got rotated to benazepril and then to losartan but the cough never improved.  So he got switch back to benazepril.  He says that his  primary care team is aware that all these agents can  cause cough but the emphasis was on controlling the blood pressure.  He says the cough is severe.  Mostly in the daytime very rarely at night.  There is a laryngeal hoarse quality to his cough.  Noticed results on fish oil and carvedilol.  We discussed about neuropathic treatment for cough but he is already on amitriptyline ROS - per HPI   OV 06/30/2019  Subjective:  Patient ID: Stephen [name], male , DOB: Dec 02, 1950 , age 12 y.o. , MRN: [id number] , ADDRESS: [address] [place] [address]   06/30/2019 -   Chief Complaint  Patient presents with  . IPF (idiopathic pulmonary fibrosis)    Does not feel the breathing is any different than last visit. Still using Ofev, but still having diarrhea with it.     FU ILD - ccp and atypical p-anca- ALL trace positive May 2019. HP panel negative.  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 Lung function: Severity -FVC 63% and DLCO 58% showing moderate ILD. Rheum evaluation - 04/25/18 - no evidence of RA (Dr Trudie Reed). Dx of IPF given 06/04/18. STarted ofev 06/09/18  HPI Stephen [name] 87 y.o. -presents for follow-up with his wife.  He has a diagnosis of IPF.  He has been on nintedanib since August 2019.  He preferred to take nintedanib initially because of his preference to have diarrhea.  At this point in time he tells me that he is having progressive dyspnea.  Early in the spring 2020 he noticed that he could not walk back from his office to the front door.  He could do this in 2019.  He also has had difficulty mowing the lawn with the more.  In addition is having significant diarrhea from the nintedanib.  It is severe despite agents.  He is willing to change the drug.  In between he saw the nurse practitioner he had a high-resolution CT chest in August 2020.  He has mildly progressive disease even compared to March 2020.  Walking desaturation test and symptom scores show evidence of progression and documented  below.       OV 08/27/2019  Subjective:  Patient ID: Stephen [name], male , DOB: February 20, 1951 , age 28 y.o. , MRN: [id number] , ADDRESS: [address] [place] [address]  FU IPF  - ccp and atypical p-anca- ALL trace positive May 2019. HP panel negative.  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 Lung function: Severity -FVC 63% and DLCO 58% showing moderate ILD. Rheum evaluation - 04/25/18 - no evidence of RA (Dr Trudie Reed). Dx of IPF given 06/04/18. STarted ofev 8/11/9. STopped Aug 2020 due to progression and diarrhea. STrted Esbriet -July 25, 2019   08/27/2019 -   Chief Complaint  Patient presents with  . Follow-up    Patient reports that his breathing is about the same. He has sob with exertion and productive cough.      HPI Stephen [name] 54 y.o. -presents with his wife for follow-up.  He tells me that Jacklynn Bue was started on July 25, 2019.  He took 3 weeks for it to come.  During this time he was off nintedanib and his diarrhea resolved. Wife tells me that he has been completely sedentary because of shortness of breath.  He feels the shortness of breath is getting worse.  He is also gained weight because of his sedentary lifestyle.  He is already morbidly obese.  He has pedal edema for which she  wears stockings.  He is not using oxygen at night because he is never needed it.  He is very concerned about the shortness of breath getting worse.  In fact it is worse even since his last visit in August 2020.  This is documented in the questionnaire below.  Also seen in the pulmonary function test.  His CT scan of the chest August 2020 shows worsening within 5 months.  I reviewed this with him.  He tells me that even taking a shower makes him desaturate although today he did not desaturate here in with a forehead probe to 88%.  However he did not do a 6-minute walk test.  He feels he would benefit from oxygen   IMPRESSION: HRCT Augu 2020 compared to March 2020 1. Spectrum of findings  compatible with basilar predominant fibrotic interstitial lung disease with probable early honeycombing at the right costophrenic angle. Slight interval progression. Findings are consistent with UIP per consensus guidelines: Diagnosis of Idiopathic Pulmonary Fibrosis: An Official ATS/ERS/JRS/ALAT Clinical Practice Guideline. [place], Iss 5, [phone], Jun 30 2017. 2. Mild mediastinal lymphadenopathy is stable, compatible with benign reactive adenopathy. 3. Three-vessel coronary atherosclerosis.  Aortic Atherosclerosis (ICD10-I70.0).   Electronically Signed   By: Ilona Sorrel M.D.   On: 06/28/2019 16:35  ROS - per HPI   OV 09/29/2019  Subjective:  Patient ID: Stephen [name], male , DOB: [birth year] , age 70 y.o. , MRN: [id number] , ADDRESS: [address] [place] [address]   FU IPF  -trace positive ccp and atypical p-anca- ALL trace positive May 2019 (repeat CCP - October 2020]. HP panel negative.  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 Lung function: Severity -FVC 63% and DLCO 58% showing moderate ILD. Rheum evaluation - 04/25/18 - no evidence of RA (Dr Trudie Reed). Dx of IPF given 06/04/18. STarted ofev 8/11/9. STopped Aug 2020 due to progression and diarrhea. STrted Esbriet -July 25, 2019   Associated sleep apnea present   Last CT 06/27/2019 Low risk cardiac stress test April 2019.   09/29/2019 -   Chief Complaint  Patient presents with  . Follow-up    Pt states his breathing has become worse since last visit and also has a worsening cough and is coughing up dark green phlegm which he has had for awhile now. Pt is on 2L O2 24/7.      HPI Stephen [name] 31 y.o. -he is now attending pulmonary rehabilitation.  On September 16, 2019 he did have a 6-minute walk test.  He did desaturate to 80% from 96%.  He dropped his pulse ox to 86% at the second minut.  He did correct with 2 L of oxygen.  E also in the interim he did have overnight oxygen study  and we have recommended 2 L nasal cannula oxygen with the CPAP.  He is now wearing that.  Of note, during the overnight oxygen study his heart rate was 48-50.  Today he also had a 6-minute walk test at our office on 2 L oxygen and with this his pulse ox did not drop.  Overall he is reporting stability.  This can be demonstrated by the symptom questionnaire below.  He does have significant amount of shortness of breath despite wearing his 2 L.  Even though the 2 L helps his dyspnea he still says the dyspnea is significant.  He still has chronic significant pedal edema.  He continues to be morbidly obese.  We discussed his diet.  His wife states that he eats all the time and cannot stop eating.  Apparently he goes through a carton of ice cream every few days.  He says that he will not be able to control his dietary indiscretion.  He is not interested in lung transplant.  He is enjoying pulmonary rehabilitation.  Given his diastolic dysfunction I did refer him to his cardiologist but he says he does not have an appointment.    02/13/2020 Follow up : ILD  Patient presents for a 100-monthfollow-up.  Patient says overall breathing is doing about the same.  He has had no flare of his cough or shortness of breath.  Says his activity level is about at baseline.  Says he would like to be more active has been try to do some yard work however is very hard for him to carry his oxygen tanks.  Would like a portable oxygen concentrator.  Walk test in the office shows desaturation below 88% on room air.  Requires 2 L of oxygen to keep O2 saturations greater than 88%.  Patient gets short of breath with heavy activity.  Has to stop and rest several times.  He remains on Esbriet.  Denies any nausea vomiting diarrhea.    OV 05/19/2020   Subjective:  Patient ID: RLevin [name] male , DOB: 609-Oct-1952 age 69y.o. years. , MRN: [id number]  ADDRESS: [address] [place] [address]PC[place]  HRonita Hipps MD Providers : Treatment Team:   Attending Provider: RBrand Males MD  FU IPF  -trace positive ccp and atypical p-anca- ALL trace positive May 2019 (repeat CCP - October 2020]. HP panel negative.  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 Lung function: Severity -FVC 63% and DLCO 58% showing moderate ILD. Rheum evaluation - 04/25/18 - no evidence of RA (Dr HTrudie Reed. Dx of IPF given 06/04/18. STarted ofev 8/11/9. STopped Aug 2020 due to progression and diarrhea. STrted Esbriet -July 25, 2019.   Chief Complaint  Patient presents with  . Follow-up    Productive cough with brownish-yellow phlegm       HPI Stephen PAULDING89y.o. -presents for follow-up.  Last seen in #2020 as standard of care.  After that he was on the [place] which prematurely ended.  He continues in his bed without any problems.  He has not lost any weight.  However he is having progressive shortness of breath and progressive cough.  Particularly worse in the last 1 month.  In the last few days the cough is even more worse.  Last night he was coughing all night.  He is not able to talk without coughing.  When he takes a shower with 2 L nasal cannula he desaturates to 84%.  When he walks in the mall with 2 L nasal cannula he desaturates to 84%.  He has been asked to uptitrate his oxygen to 4 L with exertion but is not done that.  He is also got some brown-yellow sputum for the last 1 month.  There is no wheezing.  Hycodan cough syrup is not helping.  He is frustrated by all this.  And so is his wife.  There is no edema chest pain.  He easily desaturates.    06/09/2020- Interim hx Patient contacted today for virtual telephone visit/2 to 3-week follow-up ILD. Cough may be a litle bit better but is still productive with yellow/brown mucus. Cough is not new. Hydromet medication does  help. He did not notice any difference after completing doxycycline/prednisone course. No increase in oxygen demand. Denies fever, chest tightness, wheezing.      OV  06/15/2020   Subjective:  Patient ID: Stephen [name], male , DOB: [birth year], age 67 y.o. years. , MRN: [id number],  ADDRESS: [address]  [place] [address] PCP  Ronita Hipps, MD Providers : Treatment Team:  Attending Provider: Brand Males, MD    FU IPF  -trace positive ccp and atypical p-anca- ALL trace positive May 2019 (repeat CCP - October 2020]. HP panel negative.  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 Lung function: Severity -FVC 63% and DLCO 58% showing moderate ILD. Rheum evaluation - 04/25/18 - no evidence of RA (Dr Trudie Reed). Dx of IPF given 06/04/18. STarted ofev 8/11/9. STopped Aug 2020 due to progression and diarrhea. STrted Esbriet -July 25, 2019 Gabapentin for cough July  2021  DNR since aug 2021    Chief Complaint  Patient presents with  . Follow-up    Pt states that he is about the same since last visit. States still has problems with SOB.       HPI Stephen [name] 71 y.o. -presents for follow-up of his IPF.  He is tolerating pirfenidone after last visit in July 2021.  At that visit cough is the dominant feature.  We started him on gabapentin once daily at night.  He has tolerated it well.  He says the cough is improved somewhat but not significantly with it.  It is definitely helping him though.  Doxycycline and prednisone did not help him.  In fact the prednisone burst made him a little bit restless.  He is now requiring 3 L of oxygen at rest.  Previously it was 2 L.  His last CT scan of the chest was December 2020.  In the interim he saw a Designer, jewellery.  He is confirmed his DNR status.  He is really frustrated by the cough.  He feels that his sputum inside but unable to cough.  He is gagging and is trying to clear his throat a lot.  In addition his wife tells me last week for a few days he had bilateral leg weakness and had increased imbalance issues.  However it was not a focal neurologic deficit.  There is no bladder or bowel problems.  It has since  resolved.  There is no speech issues.  There is no back pain.  They are to talk to the primary care physician about this.     SYMPTOM SCALE - ILD 2/18/ 2020  06/30/2019 Stop ofev 08/27/2019 esbriet since July 25, 2019 09/29/2019 esbriet 06/15/2020 243#  O2 use RA ra ra  2 L 3L  Shortness of Breath 0 -> 5 scale with 5 being worst (score 6 If unable to do)      At rest _0 Simple tasks - showers, clothes change, eating, shaving _1 Household (dishes, doing bed, laundry) x 0 x x   Walking level at own pace 1 1 x 3   Walking keeping up with others of same age _2 Walking up Stairs _3 Walking up Hill _4 Total (40 - 48) Dyspnea Score _5 How bad is your cough? _6 How bad is your fatigue _7 1  Simple office walk 185 feet x  3 laps goal with forehead probe 03/05/2018  06/04/2018   09/03/2018  12/17/2018  06/30/2019 Stop ofev Start esbriet 08/27/2019  April 20210 05/19/2020   O2 used _0  Room air Needs 2L at rest, 3L exertion 92  Number laps completed All 3 laops All 3 laps all3 3 x 185 3     Comments about pace Walks very slow due to baseline balance issue "40% of balance only due to remote trauma  nomral mod pace using cane Slow with cane Slow with ane Slow with cane    Resting Pulse Ox/HR 100% and 55/min 100% and 57/min 100% and 59/min 100% and 61/min 97% and 56/min 98% and 65/min  92% ra at rest  Final Pulse Ox/HR 97% and 78/min 97% and 77/mi 98% and 76/min 95% and 91/min 91% and 87/min 92% and 94/mio    Desaturated </= 88% _1  no    Desaturated <= 3% points yes yes no Yes, 5 points Yes, 6 points Yes, 6 point    Got Tachycardic >/= 90/min no no no yes no yes    Symptoms at end of test No, denied dyspnea x none none none veryt dyspneic    Miscellaneous comments npne x x            Results for Stephen [name], Stephen [name] (MRN [id number]) as of 08/27/2019 10:10  Ref. Range  04/11/2018 10:31 08/16/2018 15:35 10/03/2018 14:46 ofev 08/27/2019 08:51 esbriet  FVC-Pre Latest Units: L 2.57 2.53 2.42 2.18  FVC-%Pred-Pre Latest Units: % 63 67 65 56   Results for Stephen [name], Stephen [name] (MRN [id number]) as of 08/27/2019 10:10  Ref. Range 04/11/2018 10:31 08/16/2018 15:35 10/03/2018 14:46 08/27/2019 08:51  DLCO unc Latest Units: ml/min/mmHg 16.68   13.67  DLCO unc % pred Latest Units: % 58   59          has a past medical history of Allergy, Arthritis, GERD (gastroesophageal reflux disease), Heart murmur, Hiatal hernia, Hyperlipidemia, Hypertension, Sleep apnea, and Vertigo.   reports that he has never smoked. He has never used smokeless tobacco.  Past Surgical History:  Procedure Laterality Date  . CARDIAC CATHETERIZATION  03/22/2001  . LEG SURGERY Bilateral 04/2001    Allergies  Allergen Reactions  . Ciprofloxacin Other (See Comments)    colitis  . Codeine   . Other     Seafood allergy    Immunization History  Administered Date(s) Administered  . Fluad Quad(high Dose 65+) 06/30/2019  . Influenza, High Dose Seasonal PF 08/28/2017, 07/19/2018  . PFIZER SARS-COV-2 Vaccination 11/16/2019, 12/06/2019  . Pneumococcal Conjugate-13 08/25/2016  . Pneumococcal Polysaccharide-23 11/18/2007    Family History  Problem Relation Age of Onset  . Dementia Mother   . Heart attack Father   . Aneurysm Father   . Cancer Maternal Grandmother   . Aneurysm Maternal Grandmother      Current Outpatient Medications:  .  amitriptyline (ELAVIL) 75 MG tablet, Take 75 mg by mouth at bedtime., Disp: , Rfl:  .  amLODipine (NORVASC) 10 MG tablet, Take 10 mg by mouth daily., Disp: , Rfl:  .  aspirin EC 81 MG tablet, Take 81 mg by mouth daily., Disp: , Rfl:  .  calcium carbonate (OSCAL) 1500 (600 Ca) MG TABS tablet, Take by mouth 2 (two) times daily with a meal., Disp: , Rfl:  .  calcium carbonate (TUMS - DOSED IN MG ELEMENTAL CALCIUM)  500 MG chewable tablet, Chew 1 tablet by mouth  daily., Disp: , Rfl:  .  cholecalciferol (VITAMIN D) 1000 units tablet, Take 1,000 Units by mouth daily., Disp: , Rfl:  .  cloNIDine (CATAPRES) 0.3 MG tablet, Take 0.3 mg by mouth daily after supper. , Disp: , Rfl:  .  dicyclomine (BENTYL) 10 MG capsule, Take 10 mg by mouth 3 (three) times daily before meals. , Disp: , Rfl:  .  ESBRIET 267 MG CAPS, Take 3 capsules by mouth 3 (three) times daily., Disp: , Rfl:  .  gabapentin (NEURONTIN) 300 MG capsule, Take 1 capsule (300 mg total) by mouth at bedtime., Disp: 30 capsule, Rfl: 0 .  HYDROcodone-homatropine (HYCODAN) 5-1.5 MG/5ML syrup, Take 5 mLs by mouth 3 (three) times daily as needed for cough., Disp: 450 mL, Rfl: 0 .  lansoprazole (PREVACID) 30 MG capsule, Take 30 mg by mouth daily at 12 noon., Disp: , Rfl:  .  Methylcellulose, Laxative, (CITRUCEL PO), Take 1 Scoop by mouth., Disp: , Rfl:  .  metoprolol tartrate (LOPRESSOR) 50 MG tablet, Take 1 tablet by mouth 2 (two) times daily., Disp: , Rfl:  .  montelukast (SINGULAIR) 10 MG tablet, Take 10 mg by mouth at bedtime., Disp: , Rfl:  .  Multiple Vitamin (MULTIVITAMIN WITH MINERALS) TABS tablet, Take 1 tablet by mouth daily., Disp: , Rfl:  .  nabumetone (RELAFEN) 750 MG tablet, Take 750 mg by mouth 2 (two) times daily., Disp: , Rfl:  .  olmesartan (BENICAR) 20 MG tablet, Take 1 tablet by mouth daily., Disp: , Rfl:  .  polyethylene glycol (MIRALAX / GLYCOLAX) packet, Take 17 g by mouth daily., Disp: , Rfl:  .  Probiotic Product (PROBIOTIC DAILY PO), Take 1 capsule by mouth daily., Disp: , Rfl:  .  rosuvastatin (CRESTOR) 10 MG tablet, Take 10 mg by mouth daily., Disp: , Rfl:  .  sodium chloride 1 g tablet, Take 3 g by mouth 3 (three) times daily. , Disp: , Rfl:  .  spironolactone (ALDACTONE) 25 MG tablet, Take 25 mg by mouth 3 (three) times a week., Disp: , Rfl:  .  UNABLE TO FIND, Take 1 capsule by mouth 3 (three) times daily. Med Name: blood sugar harmony, Disp: , Rfl:  .  vitamin B-12  (CYANOCOBALAMIN) 1000 MCG tablet, Take 1,000 mcg by mouth daily., Disp: , Rfl:  .  vitamin E 400 UNIT capsule, Take 400 Units by mouth daily., Disp: , Rfl:  .  sodium chloride HYPERTONIC 3 % nebulizer solution, Take by nebulization daily., Disp: 120 mL, Rfl: 12      Objective:   Vitals:   06/15/20 1046  BP: 130/70  Pulse: 60  SpO2: 97%  Weight: 241 lb 9.6 oz (109.6 kg)  Height: _0  (1.676 m)    Estimated body mass index is 39 kg/m as calculated from the following:   Height as of this encounter: _1  (1.676 m).   Weight as of this encounter: 241 lb 9.6 oz (109.6 kg).  _2 @  Filed Weights   06/15/20 1046  Weight: 241 lb 9.6 oz (109.6 kg)     Physical Exam Obese male sitting comfortably.  He is on oxygen.  He has some distant crackles.  No cyanosis no clubbing no edema.  Has a cane.  When he tries to talk he has cough although it is improved compared to last visit.  Normal heart sounds.       Assessment:  ICD-10-CM   1. Chronic respiratory failure with hypoxia (HCC)  J96.11   2. Idiopathic pulmonary fibrosis ([place])  J84.112   3. Chronic cough  R05   4. Leg weakness, bilateral  R29.898   5. Interstitial pulmonary disease ([place])  J84.9 CT Chest High Resolution       Plan:     Patient Instructions     ICD-10-CM   1. Chronic respiratory failure with hypoxia (HCC)  J96.11   2. Idiopathic pulmonary fibrosis ([place])  J84.112   3. Chronic cough  R05   4. Leg weakness, bilateral  R29.898      IPF slowlyu getting worse with cough now severe Too bad pred burst and doxy did not hel Glad gabapentin helped but only somewhat Glad you are still on hydrometand lozenge Respect DNR status  Unclear reason for transient leg weakness  Plan -Continue oxygen at rest and with exertion but during exertion crank it up to 4 L -Continue Esbriet per protocol -Increase gabapentin 300 mg twice daily for couhg  -It is making you too sleepy or groggy let us know  - if  helping and tolerating well can increase to three times daily at followup - start 3% saline neb - 59m daily for cough - Get HRCT next few to several weeks to see why cough is worse - Chronic daily prednisone as last resport for cough versus cough research study -Advised do not intubation and no CPR if you ever call EMS -Monitor your pulse ox and always keep it above 86%   - Talk to PCP HRonita Hipps MD or go to ER for leg weakness esp if it recurs -  Follow-up -15-minute phone visit with Dr RChase Callerin Sept 2021 or APP to report progress      SIGNATURE    Dr. MBrand Males M.D., F.C.C.P,  Pulmonary and Critical Care Medicine Staff Physician, CAlmaDirector - Interstitial Lung Disease  Program  [place] [place] [id number] Pager: [phone] If no answer or between  15:00h - 7:00h: call [phone] Telephone: [phone]  11:26 AM 06/15/2020

## 2020-06-15 NOTE — Addendum Note (Signed)
Addended by: Lorretta Harp on: 06/15/2020 12:08 PM   Modules accepted: Orders

## 2020-06-15 NOTE — Patient Instructions (Addendum)
ICD-10-CM   1. Chronic respiratory failure with hypoxia (HCC)  J96.11   2. Idiopathic pulmonary fibrosis ([place])  J84.112   3. Chronic cough  R05   4. Leg weakness, bilateral  R29.898      IPF slowlyu getting worse with cough now severe Too bad pred burst and doxy did not hel Glad gabapentin helped but only somewhat Glad you are still on hydrometand lozenge Respect DNR status  Unclear reason for transient leg weakness  Plan -Continue oxygen at rest and with exertion but during exertion crank it up to 4 L -Continue Esbriet per protocol -Increase gabapentin 300 mg twice daily for couhg  -It is making you too sleepy or groggy let us know  - if helping and tolerating well can increase to three times daily at followup - start 3% saline neb - 76m daily for cough - Get HRCT next few to several weeks to see why cough is worse - Chronic daily prednisone as last resport for cough versus cough research study -Advised do not intubation and no CPR if you ever call EMS -Monitor your pulse ox and always keep it above 86%   - Talk to PCP HRonita Hipps MD or go to ER for leg weakness esp if it recurs -  Follow-up -15-minute phone visit with Dr RChase Callerin Sept 2021 or APP to report progress

## 2020-06-17 ENCOUNTER — Telehealth: Payer: Self-pay | Admitting: Internal Medicine

## 2020-06-17 DIAGNOSIS — J9611 Chronic respiratory failure with hypoxia: Secondary | ICD-10-CM | POA: Diagnosis not present

## 2020-06-17 DIAGNOSIS — J84112 Idiopathic pulmonary fibrosis: Secondary | ICD-10-CM | POA: Diagnosis not present

## 2020-06-17 DIAGNOSIS — J449 Chronic obstructive pulmonary disease, unspecified: Secondary | ICD-10-CM | POA: Diagnosis not present

## 2020-06-17 NOTE — Telephone Encounter (Signed)
Called let patient know his CXR report.  Patient stated to me he is scheduled for a CT on 07/07/20 @ 9am. I verified this with his appointment desk.   Nothing further needed at this time.

## 2020-06-17 NOTE — Telephone Encounter (Signed)
I called the patient to give him his CT appt info for 8/31 @ 8:30 AM at [facility].  Pt did not know that a CT had been ordered yet & states he hasn't gotten his CXR results back yet.  Pt asks to get his results before discussing the CT appt.    If the patient is agreeable to complete the CT, please provide the following info to the pt:  CT is scheduled for 8/31 @ 8:30 at [facility].  Pt will need to arrive 15 minutes early for check in  & there is no special prep which means he can eat & drink like he normally would.   If the patient is NOT agreeable to the CT, please let me know.   Pt can be reached at [phone].   Thank you!

## 2020-06-17 NOTE — Telephone Encounter (Signed)
Error

## 2020-06-18 ENCOUNTER — Telehealth: Payer: Self-pay | Admitting: Primary Care

## 2020-06-18 DIAGNOSIS — J84112 Idiopathic pulmonary fibrosis: Secondary | ICD-10-CM | POA: Diagnosis not present

## 2020-06-18 MED ORDER — GABAPENTIN 300 MG PO CAPS: 300.0000 mg | ORAL_CAPSULE | Freq: Two times a day (BID) | ORAL | 5 refills | Status: DC

## 2020-06-18 NOTE — Telephone Encounter (Signed)
Pt was seen 8/17 and MR had increased Gabapentin to 355m BID.  This rx was not sent to pharmacy. rx has been sent in.  Nothing further needed at this time- will close encounter.

## 2020-06-24 DIAGNOSIS — G4733 Obstructive sleep apnea (adult) (pediatric): Secondary | ICD-10-CM | POA: Diagnosis not present

## 2020-06-24 DIAGNOSIS — J84112 Idiopathic pulmonary fibrosis: Secondary | ICD-10-CM | POA: Diagnosis not present

## 2020-06-29 DIAGNOSIS — I10 Essential (primary) hypertension: Secondary | ICD-10-CM | POA: Diagnosis not present

## 2020-06-29 DIAGNOSIS — E785 Hyperlipidemia, unspecified: Secondary | ICD-10-CM | POA: Diagnosis not present

## 2020-07-07 ENCOUNTER — Telehealth: Payer: Self-pay | Admitting: Internal Medicine

## 2020-07-07 ENCOUNTER — Ambulatory Visit (INDEPENDENT_AMBULATORY_CARE_PROVIDER_SITE_OTHER)
Admission: RE | Admit: 2020-07-07 | Discharge: 2020-07-07 | Disposition: A | Discharge status: Home / Self Care | Payer: PPO | Source: Ambulatory Visit | Attending: Internal Medicine | Admitting: Internal Medicine

## 2020-07-07 ENCOUNTER — Other Ambulatory Visit: Payer: Self-pay

## 2020-07-07 DIAGNOSIS — J8410 Pulmonary fibrosis, unspecified: Secondary | ICD-10-CM | POA: Diagnosis not present

## 2020-07-07 DIAGNOSIS — J479 Bronchiectasis, uncomplicated: Secondary | ICD-10-CM | POA: Diagnosis not present

## 2020-07-07 DIAGNOSIS — J849 Interstitial pulmonary disease, unspecified: Secondary | ICD-10-CM

## 2020-07-07 DIAGNOSIS — I2510 Atherosclerotic heart disease of native coronary artery without angina pectoris: Secondary | ICD-10-CM | POA: Diagnosis not present

## 2020-07-07 DIAGNOSIS — I700 Atherosclerosis of aorta: Secondary | ICD-10-CM | POA: Diagnosis not present

## 2020-07-07 NOTE — Telephone Encounter (Signed)
Stephen [name]   You are seeing - Stephen [name]  07/14/20. Good ews - on CT IPF stable x 1 year but progresive over time.   Plan - continue esbriet  - consider pliant research protocol 12 week study (previously on galecto) - have copied Reba - who can get him a consent   Current Outpatient Medications:  .  amitriptyline (ELAVIL) 75 MG tablet, Take 75 mg by mouth at bedtime., Disp: , Rfl:  .  amLODipine (NORVASC) 10 MG tablet, Take 10 mg by mouth daily., Disp: , Rfl:  .  aspirin EC 81 MG tablet, Take 81 mg by mouth daily., Disp: , Rfl:  .  calcium carbonate (OSCAL) 1500 (600 Ca) MG TABS tablet, Take by mouth 2 (two) times daily with a meal., Disp: , Rfl:  .  calcium carbonate (TUMS - DOSED IN MG ELEMENTAL CALCIUM) 500 MG chewable tablet, Chew 1 tablet by mouth daily., Disp: , Rfl:  .  cholecalciferol (VITAMIN D) 1000 units tablet, Take 1,000 Units by mouth daily., Disp: , Rfl:  .  cloNIDine (CATAPRES) 0.3 MG tablet, Take 0.3 mg by mouth daily after supper. , Disp: , Rfl:  .  dicyclomine (BENTYL) 10 MG capsule, Take 10 mg by mouth 3 (three) times daily before meals. , Disp: , Rfl:  .  ESBRIET 267 MG CAPS, Take 3 capsules by mouth 3 (three) times daily., Disp: , Rfl:  .  gabapentin (NEURONTIN) 300 MG capsule, Take 1 capsule (300 mg total) by mouth 2 (two) times daily., Disp: 60 capsule, Rfl: 5 .  HYDROcodone-homatropine (HYCODAN) 5-1.5 MG/5ML syrup, Take 5 mLs by mouth 3 (three) times daily as needed for cough., Disp: 450 mL, Rfl: 0 .  lansoprazole (PREVACID) 30 MG capsule, Take 30 mg by mouth daily at 12 noon., Disp: , Rfl:  .  Methylcellulose, Laxative, (CITRUCEL PO), Take 1 Scoop by mouth., Disp: , Rfl:  .  metoprolol tartrate (LOPRESSOR) 50 MG tablet, Take 1 tablet by mouth 2 (two) times daily., Disp: , Rfl:  .  montelukast (SINGULAIR) 10 MG tablet, Take 10 mg by mouth at bedtime., Disp: , Rfl:  .  Multiple Vitamin (MULTIVITAMIN WITH MINERALS) TABS tablet, Take 1 tablet by mouth daily., Disp: , Rfl:   .  nabumetone (RELAFEN) 750 MG tablet, Take 750 mg by mouth 2 (two) times daily., Disp: , Rfl:  .  olmesartan (BENICAR) 20 MG tablet, Take 1 tablet by mouth daily., Disp: , Rfl:  .  polyethylene glycol (MIRALAX / GLYCOLAX) packet, Take 17 g by mouth daily., Disp: , Rfl:  .  Probiotic Product (PROBIOTIC DAILY PO), Take 1 capsule by mouth daily., Disp: , Rfl:  .  rosuvastatin (CRESTOR) 10 MG tablet, Take 10 mg by mouth daily., Disp: , Rfl:  .  sodium chloride 1 g tablet, Take 3 g by mouth 3 (three) times daily. , Disp: , Rfl:  .  sodium chloride HYPERTONIC 3 % nebulizer solution, Take by nebulization daily., Disp: 120 mL, Rfl: 12 .  spironolactone (ALDACTONE) 25 MG tablet, Take 25 mg by mouth 3 (three) times a week., Disp: , Rfl:  .  UNABLE TO FIND, Take 1 capsule by mouth 3 (three) times daily. Med Name: blood sugar harmony, Disp: , Rfl:  .  vitamin B-12 (CYANOCOBALAMIN) 1000 MCG tablet, Take 1,000 mcg by mouth daily., Disp: , Rfl:  .  vitamin E 400 UNIT capsule, Take 400 Units by mouth daily., Disp: , Rfl:      CT Chest High  Resolution  Result Date: 07/07/2020 CLINICAL DATA:  Interstitial lung disease EXAM: CT CHEST WITHOUT CONTRAST TECHNIQUE: Multidetector CT imaging of the chest was performed following the standard protocol without intravenous contrast. High resolution imaging of the lungs, as well as inspiratory and expiratory imaging, was performed. COMPARISON:  CT chest, 10/30/2019, 06/27/2019, 12/31/2018, 01/21/2018, CT abdomen pelvis, 02/01/2014 FINDINGS: Cardiovascular: Scattered aortic atherosclerosis. Mild cardiomegaly. Three-vessel coronary artery calcifications. No pericardial effusion. Mediastinum/Nodes: Unchanged prominent mediastinal and hilar lymph nodes. Thyroid gland, trachea, and esophagus demonstrate no significant findings. Lungs/Pleura: Redemonstrated pattern of moderate pulmonary fibrosis in a pattern with apical to basal gradient featuring irregular peripheral interstitial  opacity, traction bronchiectasis, bronchiolectasis, and areas of honeycombing at the lung bases. Fibrotic findings are not significantly changed in comparison to examination dated 10/30/2019, but are clearly worsened over time on multiple prior examinations dating back to 02/01/2014. No significant air trapping on expiratory phase imaging. No pleural effusion or pneumothorax. Upper Abdomen: No acute abnormality. Musculoskeletal: No chest wall mass or suspicious bone lesions identified. IMPRESSION: 1. Redemonstrated pattern of moderate pulmonary fibrosis in a pattern with apical to basal gradient featuring irregular peripheral interstitial opacity, traction bronchiectasis, bronchiolectasis, and areas of honeycombing at the lung bases. Fibrotic findings are not significantly changed in comparison to examination dated 10/30/2019, but are clearly worsened over time on multiple prior examinations dating back to 02/01/2014. 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 2. Cardiomegaly and coronary artery disease. 3. Aortic Atherosclerosis (ICD10-I70.0). Electronically Signed   By: Eddie Candle M.D.   On: 07/07/2020 09:20

## 2020-07-07 NOTE — Telephone Encounter (Signed)
Thanks for letting me know. Happy to see him

## 2020-07-08 NOTE — Telephone Encounter (Signed)
thanks

## 2020-07-13 NOTE — Progress Notes (Addendum)
Virtual Visit via Telephone Note  I connected with Stephen [name] on 07/13/20 at  9:00 AM EDT by telephone and verified that I am speaking with the correct person using two identifiers.  Location: Patient: Stephen [name] Provider: Office   I discussed the limitations, risks, security and privacy concerns of performing an evaluation and management service by telephone and the availability of in person appointments. I also discussed with the patient that there may be a patient responsible charge related to this service. The patient expressed understanding and agreed to proceed.   History of Present Illness: 69 year old male, never smoked.  Past medical history significant for idiopathic pulmonary fibrosis, chronic respiratory failure with hypoxia, sleep apnea, obesity.  Patient of Dr. Chase Caller, last seen in office on 05/19/2020.  During that visit he reported increased shortness of breath and productive cough.  Treated for suspected exacerbation with doxycycline 100 mg twice daily x5 days and prednisone taper.  He was also given gabapentin 300 mg nightly for cough.  Maintained on Esbriet.  Previous [facility] pulmonary encounter: 05/19/20- Dr. Josiah Lobo 69 y.o. -presents for follow-up.  Last seen in #2020 as standard of care.  After that he was on the [place] study which prematurely ended.  He continues in his bed without any problems.  He has not lost any weight.  However he is having progressive shortness of breath and progressive cough.  Particularly worse in the last 1 month.  In the last few days the cough is even more worse.  Last night he was coughing all night.  He is not able to talk without coughing.  When he takes a shower with 2 L nasal cannula he desaturates to 84%.  When he walks in the mall with 2 L nasal cannula he desaturates to 84%.  He has been asked to uptitrate his oxygen to 4 L with exertion but is not done that.  He is also got some brown-yellow sputum for the last 1 month.  There is  no wheezing.  Hycodan cough syrup is not helping.  He is frustrated by all this.  And so is his wife.  There is no edema chest pain.  He easily desaturates.   06/09/2020- NP Patient contacted today for virtual telephone visit/2 to 3-week follow-up ILD. Cough may be a litle bit better but is still productive with yellow/brown mucus. Cough is not new. Hydromet medication does help. He did not notice any difference after completing doxycycline/prednisone course. No increase in oxygen demand. Denies fever, chest tightness, wheezing.  06/15/20- Dr. Chase Caller FU IPF  -trace positive ccp and atypical p-anca- ALL trace positive May 2019 (repeat CCP - October 2020]. HP panel negative.  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 Lung function: Severity -FVC 63% and DLCO 58% showing moderate ILD. Rheum evaluation - 04/25/18 - no evidence of RA (Dr Trudie Reed). Dx of IPF given 06/04/18. STarted ofev 8/11/9. STopped Aug 2020 due to progression and diarrhea. STrted Esbriet -July 25, 2019 Gabapentin for cough July  2021  DNR since aug 2021  HPI Stephen [name] 89 y.o. -presents for follow-up of his IPF.  He is tolerating pirfenidone after last visit in July 2021.  At that visit cough is the dominant feature.  We started him on gabapentin once daily at night.  He has tolerated it well.  He says the cough is improved somewhat but not significantly with it.  It is definitely helping him though.  Doxycycline and prednisone  did not help him.  In fact the prednisone burst made him a little bit restless.  He is now requiring 3 L of oxygen at rest.  Previously it was 2 L.  His last CT scan of the chest was December 2020.  In the interim he saw a Designer, jewellery.  He is confirmed his DNR status.  He is really frustrated by the cough.  He feels that his sputum inside but unable to cough.  He is gagging and is trying to clear his throat a lot.  In addition his wife tells me last week for a few days he had bilateral leg  weakness and had increased imbalance issues.  However it was not a focal neurologic deficit.  There is no bladder or bowel problems.  It has since resolved.  There is no speech issues.  There is no back pain.  They are to talk to the primary care physician about this.   SYMPTOM SCALE - ILD 2/18/ 2020  06/30/2019 Stop ofev 08/27/2019 esbriet since July 25, 2019 09/29/2019 esbriet 06/15/2020 243#  O2 use RA ra ra  2 L 3L  Shortness of Breath 0 -> 5 scale with 5 being worst (score 6 If unable to do)      At rest _0 Simple tasks - showers, clothes change, eating, shaving _1 Household (dishes, doing bed, laundry) x 0 x x   Walking level at own pace 1 1 x 3   Walking keeping up with others of same age _2 Walking up Stairs _3 Walking up Hill _4 Total (40 - 48) Dyspnea Score _5 How bad is your cough? _6 How bad is your fatigue _7 07/14/2020- Interim hx Patient contacted today for follow-up televisit. He is accompanied by his wife on phone call. He is feeling about the same, breathing is alright. He still have congested cough with clear mucus. He has been using hypertonic saline nebulizer once a day. Continue with Gabapentin twice daily. Repeat HRCT showed  re-demonstrated pattern moderate pulmonary fubrosis, fibrotic findings are not significantly changed compared to 10/30/19 but are clearly worse over time on multiple exams. He is still taking Esbriet 035m TID, doing a lot better with this medication than previous. He uses 3-4L POC.     Observations/Objective:  - Able to speaking in 2-3 work sentences, HOH - No overt cough or wheeze  Assessment and Plan:  IPF - Continues to have productive cough. Fibrosis is slowly getting worse over time. HRCT in September showed moderate pulmonary fibrosis, not significantly changed compared to December 2020 but worse over time on multiple exam . Patient has elected to be DNR.  -  Continue Esbriet 8033mTID  - Continue hypertonic saline TWICE a day - Continue Gabapentin 30024mHREE times daily - Chronic daily prednisone last resort for cough versus cough research study - He Spoke yesterday to RebJamaicaill started research trial on 07/28/20  Follow Up Instructions:   - FU in 6-8 weeks with MR   I discussed the assessment and treatment plan with the patient. The patient was provided an opportunity to ask questions and all were answered. The patient agreed with the plan and demonstrated an understanding of the instructions.   The patient was advised to call  back or seek an in-person evaluation if the symptoms worsen or if the condition fails to improve as anticipated.  I provided 22 minutes of non-face-to-face time during this encounter.   Martyn Ehrich, NP

## 2020-07-14 ENCOUNTER — Ambulatory Visit (INDEPENDENT_AMBULATORY_CARE_PROVIDER_SITE_OTHER): Payer: PPO | Admitting: Primary Care

## 2020-07-14 ENCOUNTER — Other Ambulatory Visit: Payer: Self-pay

## 2020-07-14 ENCOUNTER — Encounter: Payer: Self-pay | Admitting: Primary Care

## 2020-07-14 DIAGNOSIS — J84112 Idiopathic pulmonary fibrosis: Secondary | ICD-10-CM

## 2020-07-14 NOTE — Patient Instructions (Addendum)
Recommendations: - Continue Esbriet - Increase hypertonic saline nebulizer to TWICE a day (morning before breakfast and 1 hours after dinner) - Continue Gabapentin 328m THREE times daily - Continue Oxygen at rest and with exertion - Follow with research program   Follow-up: - 6-8 weeks with MR

## 2020-07-14 NOTE — Addendum Note (Signed)
Addended by: Martyn Ehrich on: 07/14/2020 09:29 AM   Modules accepted: Level of Service

## 2020-07-16 ENCOUNTER — Telehealth: Payer: Self-pay | Admitting: Internal Medicine

## 2020-07-16 NOTE — Telephone Encounter (Signed)
Patient [name] being evalauted for research protocol with [employer]. In pre-screening study team asked if patient had AE-IPF in last 3-6 months.  After review of chart and recent CT images - though treated with prednisone none of his visits qualify as diagnostic of Acute Exacerbation of IPF  Specifically:   Even though his dyspnea got worse but his CXR or HRCT did not show "new bilateral ground-glass abnormality and/or consolidation superimposed on a background reticular or honeycomb pattern consistent with usual interstitial pneumonia (UIP) pattern ". HRCT was done on 8th Sept 2021.  Given pred  21st July 2021.      SIGNATURE    Dr. Brand Males, M.D., F.C.C.P, ACRP-CPI Pulmonary and Critical Care Medicine Research Investigator, [facility] Staff Physician, [place] Director - Interstitial Lung Disease  Program  Pulmonary [facility] [place], [place], [address]   Pager: [phone], If no answer  OR between  19:00-7:00h: page [phone] Telephone (research): [phone]  2:22 PM 07/16/2020   2:22 PM 07/16/2020

## 2020-07-19 ENCOUNTER — Telehealth: Payer: Self-pay | Admitting: Internal Medicine

## 2020-07-19 DIAGNOSIS — J84112 Idiopathic pulmonary fibrosis: Secondary | ICD-10-CM | POA: Diagnosis not present

## 2020-07-19 MED ORDER — GABAPENTIN 300 MG PO CAPS: 300.0000 mg | ORAL_CAPSULE | Freq: Three times a day (TID) | ORAL | 5 refills | Status: DC

## 2020-07-19 MED ORDER — SODIUM CHLORIDE 3 % IN NEBU: INHALATION_SOLUTION | RESPIRATORY_TRACT | 5 refills | Status: DC

## 2020-07-19 NOTE — Telephone Encounter (Signed)
Spoke with the pt  He is needing new rxs for gabapentin for tid and saline nebs for bid  rxs were sent and pt made aware

## 2020-07-25 DIAGNOSIS — G4733 Obstructive sleep apnea (adult) (pediatric): Secondary | ICD-10-CM | POA: Diagnosis not present

## 2020-07-25 DIAGNOSIS — J84112 Idiopathic pulmonary fibrosis: Secondary | ICD-10-CM | POA: Diagnosis not present

## 2020-07-28 ENCOUNTER — Encounter (INDEPENDENT_AMBULATORY_CARE_PROVIDER_SITE_OTHER): Payer: PPO | Admitting: Adult Health

## 2020-07-28 ENCOUNTER — Other Ambulatory Visit: Payer: Self-pay | Admitting: Internal Medicine

## 2020-07-28 ENCOUNTER — Encounter: Payer: PPO | Admitting: *Deleted

## 2020-07-28 ENCOUNTER — Other Ambulatory Visit: Payer: Self-pay

## 2020-07-28 DIAGNOSIS — J84112 Idiopathic pulmonary fibrosis: Secondary | ICD-10-CM

## 2020-07-28 DIAGNOSIS — Z006 Encounter for examination for normal comparison and control in clinical research program: Secondary | ICD-10-CM

## 2020-07-28 NOTE — Assessment & Plan Note (Signed)
Research visit completed per protocol

## 2020-07-28 NOTE — Progress Notes (Signed)
_0  ID: Stephen [name], male    DOB: 1951-10-19, 69 y.o.   MRN: [id number]  Chief Complaint  Patient presents with  . Research    Referring provider: Ronita Hipps, MD  HPI: Title: A randomized, double-blind, dose-ranging, placebo-controlled Phase 2a evaluation of the safety, tolerability and pharmacokinetics of [phone] in participants with idiopathic pulmonary fibrosis (IPF)   Patients will be randomized to one of three treatment arms: 20 mg [id number], 40 mg [id number], or placebo.    Key Inclusion Criteria:   Age ? 69 years old  Confident diagnosis of IPF  w/in 3 years. FVC% pred ?45% and ?90% DLco (hgb-adjusted) ?30% Treatment with nintedanib or pirfenidone allowed, But must have stable dose ?3 months before Screening Visit and remain unchanged during the study administration of study drug     Key Exclusion Criteria:   Receiving any non-approved agent intended for treatment of fibrosis in IPF FEV1 over the FVC ratio < 0.7 at screening Continuous supplemental oxygen, defined as >15 hrs/day at randomization Active infection,  that can affect FVC measurement during Screening or  Randomization Smoking of any kind w/in 3 months of Screening or unwilling to avoid smoking throughout the study History of unstable /deteriorating cardiac/pulmonary disease (other than IPF) w/in 6 months of Screening Any ongoing known malignancy, except for localized cancers (i.e. basal cell carcinoma) Participants receiving treatment with fluconazole or digoxin at Screening or during the StudyMedical or surgical condition known to affect drug absorption  Surgeries scheduled during the study period   Key features: Selective inhibitor of ?v?6 and ?v?1 integrin.  By inhibiting these transmembrane proteins, [id number] prevents ?v?6 and ?v?1 from binding to latency-associated peptide (LAP) and prevents the release of TGF-?1 into the extracellular matrix. Therefore, GNO-03704 showed reduced collagen  synthesis and demonstrated antifibrotic activity.   Pharmacokinetics, based on non-clinical studies: [id number] is a moderate inhibitor of P-gp and BCRP It is metabolized predominantly by CYP-3A4, CYP-2C9 and CYP-2C19 and to a smaller extent by CYP1A1 Monitor for drugs that inhibit or induce any of these enzymes HWT-88828 is a substrate of P-gp, BCRP, OATP1B1 t/12 in animals ranged from 0.8-2 hours   Drug Interactions: Avoid fluconazole as it may cause increased exposure of MKL-49179 Avoid digoxin as it is a P-gp substrate Potential drug-drug interaction with nintedanib: Recommend separating the administration times of XTA-56979 and nintedanib      Animal studies One study in mice showed the potential for pro-inflammatory effects in the lung In one study involving monkeys who received the high dose (1000 mg/kg/day): Two deaths occurred within a few hours of receiving the dose, deemed to be related to the investigational product Monkeys exhibited CNS symptoms such as: ptosis, tremors, abnormal gait Some monkeys experienced QT and QTc prolongation  Phase 1 study Part A: Single Ascending Dose study Four cohorts: 15 mg, 30 mg, 50 mg, 75 mg Each cohort had 10 subjects (8 active, 2 placebo) Each subject received one dose under fasting conditions   Part B: Multiple Ascending Dose study Three cohorts: subjects received 10 mg, 20 mg, or 40 mg every day for 14 days Each cohort had 11 subjects (9 active, 2 placebo)   Part C: Food effect study Twelve subjects received 2 doses of 40 mg [id number]: One dose under fasting conditions and one dose under fed conditions Results: Receiving a dose in the fed state, resulted in a reduction in the Cmax by 52% and a reduction in the AUC by 37% It is recommended that ZSM-27078 to  be administered on an empty stomach   Overall safety: 11/71 (15%) of subjects who received XIH-03888 experienced a treatment emergent adverse effect (TEAE) 5/71 (7%) of  subjects experienced constipation All other TEAEs occurred in only 1 subject (1.4%) [Table 9] Epigastric discomfort, frequent bowel movements, tooth abscess, upper respiratory tract infection, muscle spasm, headache, migraine, dermatitis The only event listed above deemed related to study drug is epigastric discomfort  One subject discontinued the study drug due to a concurrent viral illness; this was deemed unrelated to the study drug There were no other TEAE that led to study drug discontinuation There were no deaths or serious adverse events There were no clinically significant changes in vital signs, ECGs, or laboratory parameters Overall well tolerated; the most frequently reported TEAE was mild constipation, no other TEAE was reported more than once  No mention of renal nor hepatic issues   07/28/2020 Research Visit : #Screening Visit  Subject Stephen [name] with date of birth 1950/11/13 presents today for consented research visit for screening Visit  Consent was completed by Dr. Chase Caller .  Research physical was completed per protocol. Subject was thanked for his participation in research and contribution to science.    Allergies  Allergen Reactions  . Ciprofloxacin Other (See Comments)    colitis  . Codeine   . Other     Seafood allergy    Immunization History  Administered Date(s) Administered  . Fluad Quad(high Dose 65+) 06/30/2019  . Influenza, High Dose Seasonal PF 08/28/2017, 07/19/2018  . PFIZER SARS-COV-2 Vaccination 11/16/2019, 12/06/2019  . Pneumococcal Conjugate-13 08/25/2016  . Pneumococcal Polysaccharide-23 11/18/2007    Past Medical History:  Diagnosis Date  . Allergy   . Arthritis   . GERD (gastroesophageal reflux disease)   . Heart murmur   . Hiatal hernia   . Hyperlipidemia   . Hypertension   . Sleep apnea   . Vertigo     Tobacco History: Social History   Tobacco Use  Smoking Status Never Smoker  Smokeless Tobacco Never Used    Counseling given: Not Answered   Outpatient Medications Prior to Visit  Medication Sig Dispense Refill  . amitriptyline (ELAVIL) 75 MG tablet Take 75 mg by mouth at bedtime.    Marland Kitchen amLODipine (NORVASC) 10 MG tablet Take 10 mg by mouth daily.    Marland Kitchen aspirin EC 81 MG tablet Take 81 mg by mouth daily.    . calcium carbonate (OSCAL) 1500 (600 Ca) MG TABS tablet Take by mouth 2 (two) times daily with a meal.    . calcium carbonate (TUMS - DOSED IN MG ELEMENTAL CALCIUM) 500 MG chewable tablet Chew 1 tablet by mouth daily.    . cholecalciferol (VITAMIN D) 1000 units tablet Take 1,000 Units by mouth daily.    . cloNIDine (CATAPRES) 0.3 MG tablet Take 0.3 mg by mouth daily after supper.     . dicyclomine (BENTYL) 10 MG capsule Take 10 mg by mouth 3 (three) times daily before meals.     . ESBRIET 267 MG CAPS Take 3 capsules by mouth 3 (three) times daily.    Marland Kitchen gabapentin (NEURONTIN) 300 MG capsule Take 1 capsule (300 mg total) by mouth 3 (three) times daily. 90 capsule 5  . HYDROcodone-homatropine (HYCODAN) 5-1.5 MG/5ML syrup Take 5 mLs by mouth 3 (three) times daily as needed for cough. 450 mL 0  . lansoprazole (PREVACID) 30 MG capsule Take 30 mg by mouth daily at 12 noon.    . Methylcellulose,  Laxative, (CITRUCEL PO) Take 1 Scoop by mouth.    . metoprolol tartrate (LOPRESSOR) 50 MG tablet Take 1 tablet by mouth 2 (two) times daily.    . montelukast (SINGULAIR) 10 MG tablet Take 10 mg by mouth at bedtime.    . Multiple Vitamin (MULTIVITAMIN WITH MINERALS) TABS tablet Take 1 tablet by mouth daily.    . nabumetone (RELAFEN) 750 MG tablet Take 750 mg by mouth 2 (two) times daily.    Marland Kitchen olmesartan (BENICAR) 20 MG tablet Take 1 tablet by mouth daily.    . polyethylene glycol (MIRALAX / GLYCOLAX) packet Take 17 g by mouth daily.    . Probiotic Product (PROBIOTIC DAILY PO) Take 1 capsule by mouth daily.    . rosuvastatin (CRESTOR) 10 MG tablet Take 10 mg by mouth daily.    . sodium chloride 1 g tablet Take  3 g by mouth 3 (three) times daily.     . sodium chloride HYPERTONIC 3 % nebulizer solution 3 ml via neb twice daily 180 mL 5  . spironolactone (ALDACTONE) 25 MG tablet Take 25 mg by mouth 3 (three) times a week.    Marland Kitchen UNABLE TO FIND Take 1 capsule by mouth 3 (three) times daily. Med Name: blood sugar harmony    . vitamin B-12 (CYANOCOBALAMIN) 1000 MCG tablet Take 1,000 mcg by mouth daily.    . vitamin E 400 UNIT capsule Take 400 Units by mouth daily.     No facility-administered medications prior to visit.    Physical Exam Restart exam completed per protocol    BNP No results found for: BNP  ProBNP No results found for: PROBNP  Imaging: CT Chest High Resolution  Result Date: 07/07/2020 CLINICAL DATA:  Interstitial lung disease EXAM: CT CHEST WITHOUT CONTRAST TECHNIQUE: Multidetector CT imaging of the chest was performed following the standard protocol without intravenous contrast. High resolution imaging of the lungs, as well as inspiratory and expiratory imaging, was performed. COMPARISON:  CT chest, 10/30/2019, 06/27/2019, 12/31/2018, 01/21/2018, CT abdomen pelvis, 02/01/2014 FINDINGS: Cardiovascular: Scattered aortic atherosclerosis. Mild cardiomegaly. Three-vessel coronary artery calcifications. No pericardial effusion. Mediastinum/Nodes: Unchanged prominent mediastinal and hilar lymph nodes. Thyroid gland, trachea, and esophagus demonstrate no significant findings. Lungs/Pleura: Redemonstrated pattern of moderate pulmonary fibrosis in a pattern with apical to basal gradient featuring irregular peripheral interstitial opacity, traction bronchiectasis, bronchiolectasis, and areas of honeycombing at the lung bases. Fibrotic findings are not significantly changed in comparison to examination dated 10/30/2019, but are clearly worsened over time on multiple prior examinations dating back to 02/01/2014. No significant air trapping on expiratory phase imaging. No pleural effusion or pneumothorax.  Upper Abdomen: No acute abnormality. Musculoskeletal: No chest wall mass or suspicious bone lesions identified. IMPRESSION: 1. Redemonstrated pattern of moderate pulmonary fibrosis in a pattern with apical to basal gradient featuring irregular peripheral interstitial opacity, traction bronchiectasis, bronchiolectasis, and areas of honeycombing at the lung bases. Fibrotic findings are not significantly changed in comparison to examination dated 10/30/2019, but are clearly worsened over time on multiple prior examinations dating back to 02/01/2014. 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 2. Cardiomegaly and coronary artery disease. 3. Aortic Atherosclerosis (ICD10-I70.0). Electronically Signed   By: Eddie Candle M.D.   On: 07/07/2020 09:20      PFT Results Latest Ref Rng & Units 08/27/2019 10/03/2018 08/16/2018 04/11/2018  FVC-Pre L 2.18 2.42 2.53 2.57  FVC-Predicted Pre % 56 65 67 63  Pre  FEV1/FVC % % 82 91 89 89  FEV1-Pre L 1.78 2.22 2.26 2.29  FEV1-Predicted Pre % 62 80 81 76  DLCO uncorrected ml/min/mmHg 13.67 - - 16.68  DLCO UNC% % 59 - - 58  DLVA Predicted % 97 - - 106    No results found for: NITRICOXIDE      Assessment & Plan:   Idiopathic pulmonary fibrosis (HCC) Continue on current regimen  Research study patient Research visit completed per protocol     Rexene Edison, NP 07/28/2020

## 2020-07-28 NOTE — Assessment & Plan Note (Signed)
Continue on current regimen .   

## 2020-07-28 NOTE — Research (Signed)
Title: A randomized, double-blind, multicenter, dose-ranging, placebo-controlled Phase 2a evaluation of the safety, tolerability and pharmacokinetics of [id number] in participants with idiopathic pulmonary fibrosis (IPF)   Patients will be randomized to one of two treatment arms:  40 mg [id number] or placebo.   Protocol #: [id number], Clinical Trials EUDRACT# [id number]  ** Sponsor [employer] [email or web address]   ([place] [address])   Protocol Version Amendment 2 (ver.2.0) for 07/28/2020 Date double checked. Consent Version 4.0 dated [id number], approved [id number] for 07/28/2020 date double checked Investigator Brochure Version 4.0 for 07/28/2020 date    Key Inclusion Criteria:   Age = 69 years old Confident diagnosis of IPF  up to 5 years. If IPF diagnosis is within > 3 to ? 5 years at screening, the participant must have evidence of progression within the last 24 months, as defined by decline in FVC percent predicted based on a relative decline of ? 5% FVC% pred >=45%  DLCO (hgb-adjusted) >=30% Treatment with nintedanib or pirfenidone allowed, But must have stable dose =3 months before Screening Visit and remain unchanged during the study   Key Exclusion Criteria:   Receiving any non-approved agent intended for treatment of fibrosis in IPF FEV1 over the FVC ratio < 0.7 at screening Active infection,  that can affect FVC measurement during Screening or  Randomization Extent of emphysema > fibrotic changes on most recent HRCT IPF exacerbation within 6 months of Screening  Severe pulmonary hypertension Smoking of any kind w/in 3 months of Screening or unwilling to avoid smoking throughout the study History of unstable /deteriorating cardiac/pulmonary disease (other than IPF) w/in 6 months of Screening Any ongoing known malignancy, except for localized cancers (i.e. basal cell carcinoma) Medical or surgical condition known to affect drug absorption Surgeries  scheduled during the study period    Side-Effects: To date, [id number]-03009 has been given to 184 healthy participants in 43 ongoing/completed clinical studies. The major side-effects reported have been headache (mild) and constipation (mild) in 3-76% of subjects. Other side effects, in 1-2% of subjects, were mild to moderate, with fever being the only severe side-effect reported.  Mechanism: Selective dual aV6 and aV1 integrin inhibitor with antifibrotic activity. QZR-00762 prevents aV6 and aV1 from binding to the RGD sequence of latency-associated peptide (LAP), this blocks the release of activated TGF-1, thereby blocking the activation of pro-fibrotic pathways. Additionally, UQJ-33545 has been shown to reduce collagen synthesis and gene expression in human IPF tissue.   Interactions: Acts as a substrate of P-gp, OATP1B1, OATP1B3, OAT3 Inhibitors or inducers of these enzymes may affect the intestinal absorption, hepatic uptake or renal uptake of GYB-63893 Metabolized by CYP3A4, CYP2C9, CYP2C19 Potent inhibitors or inducers of these enzymes should be avoided Each CYP enzyme metabolizes < 10% of the parent compound Since multiple CYP enzymes are involved, clinically relevant drug-drug interactions are less likely, as there are other alternative pathways A potent inhibitor of multiple pathways should be used with caution as that has the potential to elicit a clinically relevant drug-drug interaction i.e. medication that inhibits CYP3A4, CYP2C9, CYP2C19 TDS-28768 is not an inducer of CYP1A2, CYP2B6, or CYP3A4 Showed moderate inhibition of P-gp and MATE1, substrates of P-gp and MATE1 may  have increased exposure when coadministered with TLX-72620 Metabolized by extrahepatic enzymes: CYP1A1, UGT1A7 Clinical relevance relating to drug interactions is unknown Stable when incubated with human recombinant MAO-A and MAO-B BTD-97416 is not an inhibitor or inducer of CYP isoforms and may be  co-administered with substrates of CYP isoforms without risk of DDI.  Interactions with specific drugs: Potential for increased exposure of nintedanib Recommend separating time of administration of KMM-38177 and nintedanib Drug interactions with pirfenidone are not expected Use of corticosteroids should be discussed beforehand with the medical monitor   Clinical drug interactions studies have not been performed at this time and therefore guidance is based on the in-vitro studies  Disallowed Medications/Foods/Supplements: Fluconazole Digoxin Rifampin Grapefruit Grapefruit-containing foods and beverages St. John's Wort   Pharmacokinetics: Tmax : 2-3 hours Half-life: approximately 48 hours Steady state reached in 5-7 days Minimal renal clearance (8.5% of drug is cleared by the kidneys)    Administration [phone] should be administered on an empty stomach May be administered via feeding tube   Safety:   Edition 4.0  30 September 2019   Animal studies: No effect on neurobehavior No effect on respiratory parameters No effects on blood pressure Showed an increase in QT and QTc The high dose 400 mg/kg showed an increase in mean heart rate   Cardiovascular effects in mice after administration of YBF-38329: Dose-dependent increases in blood pressure Increase in PR and QRS duration Increase in arrhythmias  Reproductive animal toxicology studies at the low and medium doses, 150 and 300 mg/kg/day, revealed no impact on: maternal survival, fetal body weight, food parameters, uterine and ovarian parameters, no external malformations  When the high dose 600 mg/kg/day was tested in rabbits, the rabbits displayed increased morbidities and increased abortions   Phase 1 studies:   Type of TEAE VBT-66060 n=32 Placebo n=14 GI disorders 7 (22%) 0 Infections 3 (9%) 0 Musculoskeletal 1 (3%) 0 Nervous system disorders 2 (6%) 0   Most common treatment-emergent adverse event  (TEAE):  headache  The most common drug-related adverse events were: nausea, diarrhea, abdominal distension. These occurred in 12.5% of participants  There were no deaths or serious adverse events in any of the Phase I studies There were no clinically meaningful changes in vital signs, telemetry or laboratory parameters [phone] did not display any clinically significant effect on ECG (iIncluding Qtc, HR, RR, PR and QRS)  Study [id number]-[id number] All TEAEs were mild and deemed unrelated to study drug One TEAE, viral infection, led to discontinuation of IP No deaths nor SAEs  Clinical Research officer, political party / Research RN note : This visit for Subject Stephen [name] with DOB: Mar 07, 1951 on 07/28/2020 for the above protocol is Visit #1 and is for purpose of research. The consent for this encounter is under Protocol Version Amendment 2 and is currently IRB approved. Subject expressed continued interest and consent in continuing as a study subject. Subject confirmed that there was no change in contact information (e.g. address, telephone, email). Subject thanked for participation in research and contribution to science.   In this visit 07/28/2020 the subject will be evaluated by sub-investigator named Rexene Edison, NP  . This research coordinator has verified that the sub-investigator is up to date with her training logs   Because this visit is a key visit of screening, this visit is under direct supervision of the PI Dr. Chase Caller for the consent portion of the visit . This PI is available for the visit consent process.  The subject signed informed consent prior to any study related procedures being performed. Please see the Informed Consent Process checklist in the subject's study binder for further details of the consenting process. Due to scheduling, the physical exam for the visit was conducted by the sub-I Rexene Edison, NP. All study visit assessments and procedures were conducted per the above mentioned  protocol. Because of difficulties in performing the DLCO assessment, the subject will come back during the screening period to try to collect this data again, and labs will also be collected at that time. A HRCT will also be scheduled during screening. Refer to the subject's study binder for further details of the visit.  If all assessments meet inclusion criteria, the subject will be scheduled to return for study visit 2 and randomization within the next 28 days. Signed by  Hale Drone, MS, [place] Coordinator  [place], [place] 4:25 PM 07/28/2020

## 2020-08-03 ENCOUNTER — Inpatient Hospital Stay: Admission: RE | Admit: 2020-08-03 | Payer: PPO | Source: Ambulatory Visit

## 2020-08-12 ENCOUNTER — Other Ambulatory Visit: Payer: Self-pay | Admitting: Internal Medicine

## 2020-08-13 MED ORDER — ESBRIET 267 MG PO CAPS: ORAL_CAPSULE | ORAL | 11 refills | Status: DC

## 2020-08-13 NOTE — Addendum Note (Signed)
Addended by: Claudette Head A on: 08/13/2020 05:08 PM   Modules accepted: Orders

## 2020-08-18 ENCOUNTER — Encounter: Payer: PPO | Admitting: Adult Health

## 2020-08-18 DIAGNOSIS — J84112 Idiopathic pulmonary fibrosis: Secondary | ICD-10-CM | POA: Diagnosis not present

## 2020-08-23 ENCOUNTER — Encounter: Payer: PPO | Admitting: Adult Health

## 2020-08-24 ENCOUNTER — Ambulatory Visit (INDEPENDENT_AMBULATORY_CARE_PROVIDER_SITE_OTHER): Payer: PPO | Admitting: Internal Medicine

## 2020-08-24 ENCOUNTER — Other Ambulatory Visit: Payer: Self-pay

## 2020-08-24 DIAGNOSIS — J84112 Idiopathic pulmonary fibrosis: Secondary | ICD-10-CM | POA: Diagnosis not present

## 2020-08-24 DIAGNOSIS — R053 Chronic cough: Secondary | ICD-10-CM

## 2020-08-24 DIAGNOSIS — G4733 Obstructive sleep apnea (adult) (pediatric): Secondary | ICD-10-CM | POA: Diagnosis not present

## 2020-08-24 DIAGNOSIS — Z5181 Encounter for therapeutic drug level monitoring: Secondary | ICD-10-CM | POA: Diagnosis not present

## 2020-08-24 DIAGNOSIS — Z7185 Encounter for immunization safety counseling: Secondary | ICD-10-CM | POA: Diagnosis not present

## 2020-08-24 NOTE — Patient Instructions (Addendum)
ICD-10-CM   1. IPF (idiopathic pulmonary fibrosis) ([place])  J84.112   2. Chronic cough  R05.3   3. Therapeutic drug monitoring  Z51.81   4. Vaccine counseling  Z71.85    Glad cough is controlled/improved Glad dyspnea is stable and you are manging on 3LNC  Plan  - continue esbriet for IPF  - for cough - continue gabapentin, 3% saline and hycodan at once nightly 62m        - staff will order refill for hycodan and I will do fingerprint sign - can take some days to get this done  - continue o2  - get covid booster - no more PFT testing due to cough atlest till cough improves  Followup - 3 months do LFT test  - 3 months for face to face visit in 30 min slot

## 2020-08-24 NOTE — Addendum Note (Signed)
Addended by: Amado Coe on: 08/24/2020 10:56 AM   Modules accepted: Orders

## 2020-08-24 NOTE — Progress Notes (Signed)
IOV 03/05/2018  Chief Complaint  Patient presents with  . Consult    Referral per MD Helene Kelp, Dec 2018 had sinus infection and still has cough, hx of mild bronchiectasis, later was given symbicort which patient believes is making him more SOB.      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  He lives in [facility] and presents with his wife for evaluation of pulmonary fibrosis to the ILD clinic.  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  During this time he was not exposed to any metal dust but he was exposed to mold and damp environments intermittently but not always.  He denies any mold or mildew in the house.  He tells me that in December 2018 he had a sinus infection and since then has a lingering cough that never really went away.  It looks like from the primary care physician he saw dentist and from there he had imaging and this finally all resulted in a high-resolution CT chest January 21, 2018 that I personally visualized.  The official report is that it is indeterminate for UIP.  In my personal visualization there is bilateral bibasal symmetric disease with definite of craniocaudal gradient.  The septal thickening and also cylindrical bronchiectasis and peripheral bronchiec bronchiolectasis.  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  He does not have much of her shortness of breath but then he is morbidly obese and has balance issues and is quite limited.  He was seen by a local pulmonologist will give him a 2-5-year survival rate and therefore he is frustrated.  He was started on Symbicort which actually made things worse for him.  [place] pulmonary interstitial lung disease questionnaire  Symptoms: Since dec 2069. Some sputjm +. Better since started. Insidious dyspnea x 4 months. Same since onset. No episodic  dyspnea. lLeve 4 dyspnea walking wiuth peers and level 5 walking up hill/stairs. Level 1 for dressing, Level 2 fo rshopping and mpwing lawn  ROS: chronic balance issues affecting mobiluity.  In association with this he has fatigue for the last 3 years.  He has dry mouth for the last several years associated with some dysphagia for the last 10 years heartburn and acid reflux for several years.  Daytime set sleepiness for the last several years but denies any oral ulcers or rash or weight loss or recurrent fever or arthralgia  Past medical history: Denies any asthma COPD or heart failure rheumatoid arthritis or scleroderma or systemic lupus erythematosus or polymyositis or Sjogren's but is positive for sleep apnea and hiatal hernia.  Negative for pulmonary hypertension diabetes thyroid disease stroke seizures hepatitis tuberculosis kidney disease blood clots heart disease other than murmur pleurisy  Family history of pulmonary disease: Positive for asthma in the mother but nobody with pulmonary fibrosis  Personal exposure history: He has never smoked cigarettes did smoke cigars for 3 years.  [place]  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  During this time there is intermittent mold  exposure  Pulmonary toxicity history denies a laundry list of pulmonary toxicity medications    OV 04/11/2018  Chief Complaint  Patient presents with  . Follow-up    PFT performed today.  Pt is still coughing with occ. white mucus and pt also states he believes SOB is worse and is becoming SOB more easily.    Follow-up interstitial lung disease work-up  Symptoms: In the interim no new symptoms.  He does admit to having  chronic arthralgia for many decades after sheetrock fell on him.  He also has joint stiffness especially in the hands but he does not know if it is early morning stiffness.  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 Lorin Picket.  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  His autoimmune and vascular does panel shows trace positivity for ANCA and cyclic citrulline peptide.    OV 06/04/2018   Chief Complaint  Patient presents with  . Follow-up    Pt states things have been about the same since last visit.   FU ILD - ccp and atypical p-anca trace positive May 2019. HP panel negative.  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 Lung function: Severity -FVC 63% and DLCO 58% showing moderate ILD. Rheum evaluation - 04/25/18 - no evidence of RA (Dr Trudie Reed)  Here with his wife to discuss test results. In the interim he saw Dr. Gavin Pound on 04/25/2018. She is of rheumatology. Clinically she did not find any evidence of systemic rheumatoid arthritis or vasculitis. Therefore he is here for decision making regarding his ILD. In the interim there are no new issues.  OV 07/16/2018  Subjective:  Patient ID: Stephen [name], male , DOB: 1951-09-04 , age 69 y.o. , MRN: [id number] , ADDRESS: [address] [place] [address]   07/16/2018 -   Chief Complaint  Patient presents with  . Follow-up    Pt started OFEV 8/11 and so far denies any complaints.    FU ILD - ccp and atypical p-anca trace positive May 2019. HP panel negative.  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 Lung function: Severity -FVC 63% and DLCO 58% showing moderate ILD. Rheum evaluation - 04/25/18 - no evidence of RA (Dr Trudie Reed). Dx of IPF given 06/04/18. STarted ofev 8/11/9    HPI Stephen [name] 69 y.o. - presents for follow-up of his idiopathic pulmonary fibrosis. Diagnoses given 06/04/2018. He is started on  Ofev 06/09/2018. He is here with his wife. He says he is tolerating the Ofev just fine. No GI issues. He gets support from the Child psychotherapist Clarene Critchley. He will have a liver function test today. He deferred flu shot because he will have it with his primary care physician. He told me that he is planning to join the pulmonary fibrosis foundation patient support group. Next meeting is 07/25/2018 Thursday 6 PM at [facility]. He is also willing to join research trials.He has mobility issues on account of his obesity and cane use. He says he cannot do a treadmill or walk long distances.   He has chronic tinnitus and apparently this is from working in [employer] base during [place] War although he was never deployed overseas. He started getting VA eligibility. He asked [place] to fill forms. ROS - per HPI  OV 09/03/2018  Subjective:  Patient ID:  Stephen [name], male , DOB: 1951-07-08 , age 11 y.o. , MRN: [id number] , ADDRESS: [address] [place] [address]   09/03/2018 -   Chief Complaint  Patient presents with  . Follow-up    Doing well at this time coughing up white mucus     FU ILD - ccp and atypical p-anca trace positive May 2019. HP panel negative.  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 Lung function: Severity -FVC 63% and DLCO 58% showing moderate ILD. Rheum evaluation - 04/25/18 - no evidence of RA (Dr Trudie Reed). Dx of IPF given 06/04/18. STarted ofev 8/11/9   HPI Stephen [name] 38 y.o. -returns for follow-up of his IPF.  He has been on nintedanib since mid August 2019.  He is here with his wife.  So far is tolerating the nintedanib just fine.  He says that his baseline constipation still continues and he needs both MiraLAX and Citrucel.  However what he notices that when he has relief of constipation it is diarrhea.  He says he is not able to come down on the laxative dose because otherwise the constipation returns.  He feels the 05 is not causing any diarrhea.  Is no  abdominal pain nausea vomiting.  In terms of shortness of breath is stable.  He tried to have pulmonary function test as follow-up like a few weeks ago but he started coughing and did not produce enough spirometry's.  All we have the symptoms which is stable.  He is up-to-date with his flu shot.  He is interested in research protocols.  He uses a cane normally.      OV 12/17/2018  Subjective:  Patient ID: Stephen [name], male , DOB: 02/15/1951 , age 47 y.o. , MRN: [id number] , ADDRESS: [address] [place] [address]   12/17/2018 -   Chief Complaint  Patient presents with  . Follow-up    PFT performed today but pt was unable to perform DLCO due to coughing. Pt states SOB is about the same. Denies any CP or chest tightness.    FU ILD - ccp and atypical p-anca- ALL trace positive May 2019. HP panel negative.  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 Lung function: Severity -FVC 63% and DLCO 58% showing moderate ILD. Rheum evaluation - 04/25/18 - no evidence of RA (Dr Trudie Reed). Dx of IPF given 06/04/18. STarted ofev 8/11/9   HPI Stephen [name] 63 y.o. -IPF follow-up.  He presents with his wife as usual.  He tells me that starting January 2020 nintedanib started causing significant diarrhea.  By the third week of January 2020 he reduced himself to 1 tablet once daily at 150 mg.  With this there is no diarrhea.  He says that at the full dose he would take Imodium for diarrhea and then he would get constipated for several days.  He would follow this with a laxative and then he would have explosive diarrhea.  He said the symptoms became unmanageable.  In terms of his IPF itself he is stable.  His other main issues worsening chronic cough.  He has had chronic cough for b for several years predating IPF diagnosis.  He has been on ACE inhibitor's but it appears that lisinopril got rotated to benazepril and then to losartan but the cough never improved.  So he got switch back to benazepril.  He says that his  primary care team is aware that all these agents can  cause cough but the emphasis was on controlling the blood pressure.  He says the cough is severe.  Mostly in the daytime very rarely at night.  There is a laryngeal hoarse quality to his cough.  Noticed results on fish oil and carvedilol.  We discussed about neuropathic treatment for cough but he is already on amitriptyline ROS - per HPI   OV 06/30/2019  Subjective:  Patient ID: Stephen [name], male , DOB: Dec 02, 1950 , age 12 y.o. , MRN: [id number] , ADDRESS: [address] [place] [address]   06/30/2019 -   Chief Complaint  Patient presents with  . IPF (idiopathic pulmonary fibrosis)    Does not feel the breathing is any different than last visit. Still using Ofev, but still having diarrhea with it.     FU ILD - ccp and atypical p-anca- ALL trace positive May 2019. HP panel negative.  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 Lung function: Severity -FVC 63% and DLCO 58% showing moderate ILD. Rheum evaluation - 04/25/18 - no evidence of RA (Dr Trudie Reed). Dx of IPF given 06/04/18. STarted ofev 06/09/18  HPI Stephen [name] 87 y.o. -presents for follow-up with his wife.  He has a diagnosis of IPF.  He has been on nintedanib since August 2019.  He preferred to take nintedanib initially because of his preference to have diarrhea.  At this point in time he tells me that he is having progressive dyspnea.  Early in the spring 2020 he noticed that he could not walk back from his office to the front door.  He could do this in 2019.  He also has had difficulty mowing the lawn with the more.  In addition is having significant diarrhea from the nintedanib.  It is severe despite agents.  He is willing to change the drug.  In between he saw the nurse practitioner he had a high-resolution CT chest in August 2020.  He has mildly progressive disease even compared to March 2020.  Walking desaturation test and symptom scores show evidence of progression and documented  below.       OV 08/27/2019  Subjective:  Patient ID: Stephen [name], male , DOB: February 20, 1951 , age 28 y.o. , MRN: [id number] , ADDRESS: [address] [place] [address]  FU IPF  - ccp and atypical p-anca- ALL trace positive May 2019. HP panel negative.  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 Lung function: Severity -FVC 63% and DLCO 58% showing moderate ILD. Rheum evaluation - 04/25/18 - no evidence of RA (Dr Trudie Reed). Dx of IPF given 06/04/18. STarted ofev 8/11/9. STopped Aug 2020 due to progression and diarrhea. STrted Esbriet -July 25, 2019   08/27/2019 -   Chief Complaint  Patient presents with  . Follow-up    Patient reports that his breathing is about the same. He has sob with exertion and productive cough.      HPI Stephen [name] 54 y.o. -presents with his wife for follow-up.  He tells me that Jacklynn Bue was started on July 25, 2019.  He took 3 weeks for it to come.  During this time he was off nintedanib and his diarrhea resolved. Wife tells me that he has been completely sedentary because of shortness of breath.  He feels the shortness of breath is getting worse.  He is also gained weight because of his sedentary lifestyle.  He is already morbidly obese.  He has pedal edema for which she  wears stockings.  He is not using oxygen at night because he is never needed it.  He is very concerned about the shortness of breath getting worse.  In fact it is worse even since his last visit in August 2020.  This is documented in the questionnaire below.  Also seen in the pulmonary function test.  His CT scan of the chest August 2020 shows worsening within 5 months.  I reviewed this with him.  He tells me that even taking a shower makes him desaturate although today he did not desaturate here in with a forehead probe to 88%.  However he did not do a 6-minute walk test.  He feels he would benefit from oxygen   IMPRESSION: HRCT Augu 2020 compared to March 2020 1. Spectrum of findings  compatible with basilar predominant fibrotic interstitial lung disease with probable early honeycombing at the right costophrenic angle. Slight interval progression. Findings are consistent with UIP per consensus guidelines: Diagnosis of Idiopathic Pulmonary Fibrosis: An Official ATS/ERS/JRS/ALAT Clinical Practice Guideline. [place], Iss 5, [phone], Jun 30 2017. 2. Mild mediastinal lymphadenopathy is stable, compatible with benign reactive adenopathy. 3. Three-vessel coronary atherosclerosis.  Aortic Atherosclerosis (ICD10-I70.0).   Electronically Signed   By: Ilona Sorrel M.D.   On: 06/28/2019 16:35  ROS - per HPI   OV 09/29/2019  Subjective:  Patient ID: Stephen [name], male , DOB: [birth year] , age 70 y.o. , MRN: [id number] , ADDRESS: [address] [place] [address]   FU IPF  -trace positive ccp and atypical p-anca- ALL trace positive May 2019 (repeat CCP - October 2020]. HP panel negative.  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 Lung function: Severity -FVC 63% and DLCO 58% showing moderate ILD. Rheum evaluation - 04/25/18 - no evidence of RA (Dr Trudie Reed). Dx of IPF given 06/04/18. STarted ofev 8/11/9. STopped Aug 2020 due to progression and diarrhea. STrted Esbriet -July 25, 2019   Associated sleep apnea present   Last CT 06/27/2019 Low risk cardiac stress test April 2019.   09/29/2019 -   Chief Complaint  Patient presents with  . Follow-up    Pt states his breathing has become worse since last visit and also has a worsening cough and is coughing up dark green phlegm which he has had for awhile now. Pt is on 2L O2 24/7.      HPI Stephen [name] 31 y.o. -he is now attending pulmonary rehabilitation.  On September 16, 2019 he did have a 6-minute walk test.  He did desaturate to 80% from 96%.  He dropped his pulse ox to 86% at the second minut.  He did correct with 2 L of oxygen.  E also in the interim he did have overnight oxygen study  and we have recommended 2 L nasal cannula oxygen with the CPAP.  He is now wearing that.  Of note, during the overnight oxygen study his heart rate was 48-50.  Today he also had a 6-minute walk test at our office on 2 L oxygen and with this his pulse ox did not drop.  Overall he is reporting stability.  This can be demonstrated by the symptom questionnaire below.  He does have significant amount of shortness of breath despite wearing his 2 L.  Even though the 2 L helps his dyspnea he still says the dyspnea is significant.  He still has chronic significant pedal edema.  He continues to be morbidly obese.  We discussed his diet.  His wife states that he eats all the time and cannot stop eating.  Apparently he goes through a carton of ice cream every few days.  He says that he will not be able to control his dietary indiscretion.  He is not interested in lung transplant.  He is enjoying pulmonary rehabilitation.  Given his diastolic dysfunction I did refer him to his cardiologist but he says he does not have an appointment.    02/13/2020 Follow up : ILD  Patient presents for a 100-monthfollow-up.  Patient says overall breathing is doing about the same.  He has had no flare of his cough or shortness of breath.  Says his activity level is about at baseline.  Says he would like to be more active has been try to do some yard work however is very hard for him to carry his oxygen tanks.  Would like a portable oxygen concentrator.  Walk test in the office shows desaturation below 88% on room air.  Requires 2 L of oxygen to keep O2 saturations greater than 88%.  Patient gets short of breath with heavy activity.  Has to stop and rest several times.  He remains on Esbriet.  Denies any nausea vomiting diarrhea.    OV 05/19/2020   Subjective:  Patient ID: RLevin [name] male , DOB: 609-Oct-1952 age 69y.o. years. , MRN: [id number]  ADDRESS: [address] [place] [address]PC[place]  HRonita Hipps MD Providers : Treatment Team:   Attending Provider: RBrand Males MD  FU IPF  -trace positive ccp and atypical p-anca- ALL trace positive May 2019 (repeat CCP - October 2020]. HP panel negative.  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 Lung function: Severity -FVC 63% and DLCO 58% showing moderate ILD. Rheum evaluation - 04/25/18 - no evidence of RA (Dr HTrudie Reed. Dx of IPF given 06/04/18. STarted ofev 8/11/9. STopped Aug 2020 due to progression and diarrhea. STrted Esbriet -July 25, 2019.   Chief Complaint  Patient presents with  . Follow-up    Productive cough with brownish-yellow phlegm       HPI RJASIYAH PAULDING89y.o. -presents for follow-up.  Last seen in #2020 as standard of care.  After that he was on the [place] which prematurely ended.  He continues in his bed without any problems.  He has not lost any weight.  However he is having progressive shortness of breath and progressive cough.  Particularly worse in the last 1 month.  In the last few days the cough is even more worse.  Last night he was coughing all night.  He is not able to talk without coughing.  When he takes a shower with 2 L nasal cannula he desaturates to 84%.  When he walks in the mall with 2 L nasal cannula he desaturates to 84%.  He has been asked to uptitrate his oxygen to 4 L with exertion but is not done that.  He is also got some brown-yellow sputum for the last 1 month.  There is no wheezing.  Hycodan cough syrup is not helping.  He is frustrated by all this.  And so is his wife.  There is no edema chest pain.  He easily desaturates.    06/09/2020- Interim hx Patient contacted today for virtual telephone visit/2 to 3-week follow-up ILD. Cough may be a litle bit better but is still productive with yellow/brown mucus. Cough is not new. Hydromet medication does  help. He did not notice any difference after completing doxycycline/prednisone course. No increase in oxygen demand. Denies fever, chest tightness, wheezing.      OV  06/15/2020   Subjective:  Patient ID: Stephen [name], male , DOB: [birth year], age 67 y.o. years. , MRN: [id number],  ADDRESS: [address] [place] [address] PCP  Ronita Hipps, MD Providers : Treatment Team:  Attending Provider: Brand Males, MD    FU IPF  -trace positive ccp and atypical p-anca- ALL trace positive May 2019 (repeat CCP - October 2020]. HP panel negative.  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 Lung function: Severity -FVC 63% and DLCO 58% showing moderate ILD. Rheum evaluation - 04/25/18 - no evidence of RA (Dr Trudie Reed). Dx of IPF given 06/04/18. STarted ofev 8/11/9. STopped Aug 2020 due to progression and diarrhea. STrted Esbriet -July 25, 2019 Gabapentin for cough July  2021  DNR since aug 2021    Chief Complaint  Patient presents with  . Follow-up    Pt states that he is about the same since last visit. States still has problems with SOB.       HPI Stephen [name] 71 y.o. -presents for follow-up of his IPF.  He is tolerating pirfenidone after last visit in July 2021.  At that visit cough is the dominant feature.  We started him on gabapentin once daily at night.  He has tolerated it well.  He says the cough is improved somewhat but not significantly with it.  It is definitely helping him though.  Doxycycline and prednisone did not help him.  In fact the prednisone burst made him a little bit restless.  He is now requiring 3 L of oxygen at rest.  Previously it was 2 L.  His last CT scan of the chest was December 2020.  In the interim he saw a Designer, jewellery.  He is confirmed his DNR status.  He is really frustrated by the cough.  He feels that his sputum inside but unable to cough.  He is gagging and is trying to clear his throat a lot.  In addition his wife tells me last week for a few days he had bilateral leg weakness and had increased imbalance issues.  However it was not a focal neurologic deficit.  There is no bladder or bowel problems.  It has since  resolved.  There is no speech issues.  There is no back pain.  They are to talk to the primary care physician about this.     SYMPTOM SCALE - ILD 2/18/ 2020  06/30/2019 Stop ofev 08/27/2019 esbriet since July 25, 2019 09/29/2019 esbriet 06/15/2020 243#  O2 use RA ra ra  2 L 3L  Shortness of Breath 0 -> 5 scale with 5 being worst (score 6 If unable to do)      At rest _0 Simple tasks - showers, clothes change, eating, shaving _1 Household (dishes, doing bed, laundry) x 0 x x   Walking level at own pace 1 1 x 3   Walking keeping up with others of same age _2 Walking up Stairs _3 Walking up Hill _4 Total (40 - 48) Dyspnea Score _5 How bad is your cough? _6 How bad is your fatigue _7 1  OV 08/24/2020  Subjective:  Patient ID: Stephen [name], male , DOB: 1951/05/23 , age 70 y.o. , MRN: [id number] , ADDRESS: [address] [place] [address] PCP Ronita Hipps, MD Patient Care Team: Ronita Hipps, MD as PCP - General (Family Medicine)  This Provider for this visit: Treatment Team:  Attending Provider: Brand Males, MD  Type of visit: Telephone/Video Circumstance: COVID-19 national emergency Identification of patient Stephen [name] with Jun 22, 1951 and MRN [id number] - 2 person identifier Risks: Risks, benefits, limitations of telephone visit explained. Patient understood and verbalized agreement to proceed Anyone else on call: yes - wife Patient location:his home This provider location: home due to extenutating circumstance   08/24/2020 - FU IPF   FU IPF  -trace positive ccp and atypical p-anca- ALL trace positive May 2019 (repeat CCP - October 2020]. HP panel negative.  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 Lung function: Severity -FVC 63% and DLCO 58% showing moderate ILD. Rheum evaluation - 04/25/18 - no evidence of RA (Dr Trudie Reed). Dx of IPF given 06/04/18. STarted ofev 8/11/9. STopped Aug 2020 due to  progression and diarrhea. STrted Esbriet -July 25, 2019 .Screen Failed Pliant study due to severe cough - Sept 2021  Severe cough   - Gabapentin for cough July  2021  - 3% saline - since Aug 2021  - hycodan qhs  - Unable to do PFT  DNR but full medical care since aug 2021  On 3LNC   HPI Stephen [name] 56 y.o. - telephone visit. Joined by wife. Says dyspnea stable on 3L [place] continuous and baseline. Says cough improved with rx of gabapentin, 3% saline and hycodan qhs. Wants refill on latter. Did not qualify for pliant study due to cough interfereing with PFT quality. Tolerating esbriet well. No new issues     Simple office walk 185 feet x  3 laps goal with forehead probe 03/05/2018  06/04/2018   09/03/2018  12/17/2018  06/30/2019 Stop ofev Start esbriet 08/27/2019  April 20210 05/19/2020   O2 used _0  Room air Needs 2L at rest, 3L exertion 92  Number laps completed All 3 laops All 3 laps all3 3 x 185 3     Comments about pace Walks very slow due to baseline balance issue "40% of balance only due to remote trauma  nomral mod pace using cane Slow with cane Slow with ane Slow with cane    Resting Pulse Ox/HR 100% and 55/min 100% and 57/min 100% and 59/min 100% and 61/min 97% and 56/min 98% and 65/min  92% ra at rest  Final Pulse Ox/HR 97% and 78/min 97% and 77/mi 98% and 76/min 95% and 91/min 91% and 87/min 92% and 94/mio    Desaturated </= 88% _1  no    Desaturated <= 3% points yes yes no Yes, 5 points Yes, 6 points Yes, 6 point    Got Tachycardic >/= 90/min no no no yes no yes    Symptoms at end of test No, denied dyspnea x none none none veryt dyspneic    Miscellaneous comments npne x x             PFT Results Latest Ref Rng & Units 08/27/2019 10/03/2018 08/16/2018 04/11/2018  FVC-Pre L 2.18 2.42 2.53 2.57  FVC-Predicted Pre % 56 65 67 63  Pre FEV1/FVC % % 82 91 89 89  FEV1-Pre L 1.78 2.22 2.26 2.29  FEV1-Predicted Pre % 62  80 81 76  DLCO uncorrected ml/min/mmHg 13.67 - - 16.68  DLCO UNC% % 59 - - 58  DLVA Predicted % 97 - - 106    ROS - per HPI  IMPRESSION: HRCT Sep 2021 1. Redemonstrated pattern of moderate pulmonary fibrosis in a pattern with apical to basal gradient featuring irregular peripheral interstitial opacity, traction bronchiectasis, bronchiolectasis, and areas of honeycombing at the lung bases. Fibrotic findings are not significantly changed in comparison to examination dated 10/30/2019, but are clearly worsened over time on multiple prior examinations dating back to 02/01/2014. 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 2. Cardiomegaly and coronary artery disease. 3. Aortic Atherosclerosis (ICD10-I70.0).   Electronically Signed   By: Eddie Candle M.D.   On: 07/07/2020 09:20    has a past medical history of Allergy, Arthritis, GERD (gastroesophageal reflux disease), Heart murmur, Hiatal hernia, Hyperlipidemia, Hypertension, Sleep apnea, and Vertigo.   reports that he has never smoked. He has never used smokeless tobacco.  Past Surgical History:  Procedure Laterality Date  . CARDIAC CATHETERIZATION  03/22/2001  . LEG SURGERY Bilateral 04/2001    Allergies  Allergen Reactions  . Ciprofloxacin Other (See Comments)    colitis  . Codeine   . Other     Seafood allergy    Immunization History  Administered Date(s) Administered  . Fluad Quad(high Dose 65+) 06/30/2019  . Influenza, High Dose Seasonal PF 08/28/2017, 07/19/2018  . PFIZER SARS-COV-2 Vaccination 11/16/2019, 12/06/2019  . Pneumococcal Conjugate-13 08/25/2016  . Pneumococcal Polysaccharide-23 11/18/2007    Family History  Problem Relation Age of Onset  . Dementia Mother   . Heart attack Father   . Aneurysm Father   . Cancer Maternal Grandmother   . Aneurysm Maternal Grandmother      Current Outpatient Medications:  .   amitriptyline (ELAVIL) 75 MG tablet, Take 75 mg by mouth at bedtime., Disp: , Rfl:  .  amLODipine (NORVASC) 10 MG tablet, Take 10 mg by mouth daily., Disp: , Rfl:  .  aspirin EC 81 MG tablet, Take 81 mg by mouth daily., Disp: , Rfl:  .  calcium carbonate (OSCAL) 1500 (600 Ca) MG TABS tablet, Take by mouth 2 (two) times daily with a meal., Disp: , Rfl:  .  calcium carbonate (TUMS - DOSED IN MG ELEMENTAL CALCIUM) 500 MG chewable tablet, Chew 1 tablet by mouth daily., Disp: , Rfl:  .  cholecalciferol (VITAMIN D) 1000 units tablet, Take 1,000 Units by mouth daily., Disp: , Rfl:  .  cloNIDine (CATAPRES) 0.3 MG tablet, Take 0.3 mg by mouth daily after supper. , Disp: , Rfl:  .  dicyclomine (BENTYL) 10 MG capsule, Take 10 mg by mouth 3 (three) times daily before meals. , Disp: , Rfl:  .  ESBRIET 267 MG CAPS, Take 3 capsules three times a day with food., Disp: 270 capsule, Rfl: 11 .  gabapentin (NEURONTIN) 300 MG capsule, Take 1 capsule (300 mg total) by mouth 3 (three) times daily., Disp: 90 capsule, Rfl: 5 .  HYDROcodone-homatropine (HYCODAN) 5-1.5 MG/5ML syrup, Take 5 mLs by mouth 3 (three) times daily as needed for cough., Disp: 450 mL, Rfl: 0 .  lansoprazole (PREVACID) 30 MG capsule, Take 30 mg by mouth daily at 12 noon., Disp: , Rfl:  .  Methylcellulose, Laxative, (CITRUCEL PO), Take 1 Scoop by mouth., Disp: , Rfl:  .  metoprolol tartrate (LOPRESSOR) 50 MG  tablet, Take 1 tablet by mouth 2 (two) times daily., Disp: , Rfl:  .  montelukast (SINGULAIR) 10 MG tablet, Take 10 mg by mouth at bedtime., Disp: , Rfl:  .  Multiple Vitamin (MULTIVITAMIN WITH MINERALS) TABS tablet, Take 1 tablet by mouth daily., Disp: , Rfl:  .  nabumetone (RELAFEN) 750 MG tablet, Take 750 mg by mouth 2 (two) times daily., Disp: , Rfl:  .  olmesartan (BENICAR) 20 MG tablet, Take 1 tablet by mouth daily., Disp: , Rfl:  .  polyethylene glycol (MIRALAX / GLYCOLAX) packet, Take 17 g by mouth daily., Disp: , Rfl:  .  Probiotic  Product (PROBIOTIC DAILY PO), Take 1 capsule by mouth daily., Disp: , Rfl:  .  rosuvastatin (CRESTOR) 10 MG tablet, Take 10 mg by mouth daily., Disp: , Rfl:  .  sodium chloride 1 g tablet, Take 3 g by mouth 3 (three) times daily. , Disp: , Rfl:  .  sodium chloride HYPERTONIC 3 % nebulizer solution, 3 ml via neb twice daily, Disp: 180 mL, Rfl: 5 .  spironolactone (ALDACTONE) 25 MG tablet, Take 25 mg by mouth 3 (three) times a week., Disp: , Rfl:  .  UNABLE TO FIND, Take 1 capsule by mouth 3 (three) times daily. Med Name: blood sugar harmony, Disp: , Rfl:  .  vitamin B-12 (CYANOCOBALAMIN) 1000 MCG tablet, Take 1,000 mcg by mouth daily., Disp: , Rfl:  .  vitamin E 400 UNIT capsule, Take 400 Units by mouth daily., Disp: , Rfl:       Objective:   There were no vitals filed for this visit.  Estimated body mass index is 39 kg/m as calculated from the following:   Height as of 06/15/20: 5' 6" (1.676 m).   Weight as of 06/15/20: 241 lb 9.6 oz (109.6 kg).  _0 @  There were no vitals filed for this visit.  Assessment:       ICD-10-CM   1. IPF (idiopathic pulmonary fibrosis) ([place])  J84.112   2. Chronic cough  R05.3   3. Therapeutic drug monitoring  Z51.81   4. Vaccine counseling  Z71.85        Plan:     Patient Instructions     ICD-10-CM   1. IPF (idiopathic pulmonary fibrosis) ([place])  J84.112   2. Chronic cough  R05.3   3. Therapeutic drug monitoring  Z51.81   4. Vaccine counseling  Z71.85    Glad cough is controlled/improved Glad dyspnea is stable and you are manging on 3LNC  Plan  - continue esbriet for IPF  - for cough - continue gabapentin, 3% saline and hycodan at once nightly 61m        - staff will order refill for hycodan and I will do fingerprint sign - can take some days to get this done  - continue o2  - get covid booster - no more PFT testing due to cough atlest till cough improves  Followup - 3 months do LFT test  - 3 months for face to face visit in  30 min slot   (Telephone visit - Level 03 visit: Estb 21-30 for this visit type which was visit type: telephone visit in total care time and counseling or/and coordination of care by this undersigned MD - Dr MBrand Males This includes one or more of the following for care delivered on 08/24/2020 same day: pre-charting, chart review, note writing, documentation discussion of test results, diagnostic or treatment recommendations, prognosis, risks and benefits of management  options, instructions, education, compliance or risk-factor reduction. It excludes time spent by the [facility] or office staff in the care of the patient. Actual time was 30 min. E&M code is [phone])   SIGNATURE    Dr. Brand Males, M.D., F.C.C.P,  Pulmonary and Critical Care Medicine Staff Physician, [place] Director - Interstitial Lung Disease  Program  Pulmonary [place] [facility] [place], [place], [address]  Pager: [phone], If no answer or between  15:00h - 7:00h: call [phone] Telephone: [phone]  10:11 AM 08/24/2020

## 2020-08-25 DIAGNOSIS — G4733 Obstructive sleep apnea (adult) (pediatric): Secondary | ICD-10-CM | POA: Diagnosis not present

## 2020-08-28 DIAGNOSIS — E785 Hyperlipidemia, unspecified: Secondary | ICD-10-CM | POA: Diagnosis not present

## 2020-08-28 DIAGNOSIS — I10 Essential (primary) hypertension: Secondary | ICD-10-CM | POA: Diagnosis not present

## 2020-08-30 DIAGNOSIS — I10 Essential (primary) hypertension: Secondary | ICD-10-CM | POA: Diagnosis not present

## 2020-08-30 DIAGNOSIS — E7800 Pure hypercholesterolemia, unspecified: Secondary | ICD-10-CM | POA: Diagnosis not present

## 2020-08-30 DIAGNOSIS — Z1331 Encounter for screening for depression: Secondary | ICD-10-CM | POA: Diagnosis not present

## 2020-08-30 DIAGNOSIS — E119 Type 2 diabetes mellitus without complications: Secondary | ICD-10-CM | POA: Diagnosis not present

## 2020-08-30 DIAGNOSIS — Z0000 Encounter for general adult medical examination without abnormal findings: Secondary | ICD-10-CM | POA: Diagnosis not present

## 2020-08-30 DIAGNOSIS — Z6838 Body mass index (BMI) 38.0-38.9, adult: Secondary | ICD-10-CM | POA: Diagnosis not present

## 2020-09-02 ENCOUNTER — Telehealth: Payer: Self-pay | Admitting: Internal Medicine

## 2020-09-02 NOTE — Telephone Encounter (Signed)
Spoke with the pt's spouse  She is asking for [place] to refill the hycodan syrup   Last given by [facility] on 06/09/20 # 450 ml  Please advise thanks

## 2020-09-03 ENCOUNTER — Other Ambulatory Visit: Payer: Self-pay | Admitting: Internal Medicine

## 2020-09-03 NOTE — Telephone Encounter (Signed)
It looks like this patient is now on Hycodan for his cough instead of Tussionex.

## 2020-09-08 NOTE — Telephone Encounter (Signed)
MR, please advise. Thanks.   RX is pending within this encounter.

## 2020-09-09 ENCOUNTER — Telehealth: Payer: Self-pay | Admitting: Internal Medicine

## 2020-09-09 NOTE — Telephone Encounter (Signed)
Attempted to call pt's wife but unable to reach. Left message for them to return call so we can provide them the update from MR about pt's Hycodan.

## 2020-09-09 NOTE — Telephone Encounter (Signed)
Per MR last phone note---Note Over last weekend I could not get any of the  [facility] sensors to pick uop my finer print. Over weekendI tried to go to [facility] office but was locked. I do not have it on my phone. Just now I tried from [facility] and wont pick up. Might have to wait till next week. I might have to put this on my phone     I spoke with pt's spouse and notified of the above response per Dr. Chase Caller and she verbalized understanding. Will forward back to MR as reminder this still needs to be done next wk. Thanks.

## 2020-09-09 NOTE — Telephone Encounter (Signed)
Over last weekend I could not get any of the  [facility] sensors to pick uop my finer print. Over weekendI tried to go to [facility] office but was locked. I do not have it on my phone. Just now I tried from [place] [facility] and wont pick up. Might have to wait till next week. I might have to put this on my phone

## 2020-09-11 NOTE — Telephone Encounter (Signed)
Patient is aware of below message and voiced his understanding.  he is okay with waiting until next week.

## 2020-09-13 MED ORDER — HYDROCODONE-HOMATROPINE 5-1.5 MG/5ML PO SYRP
5.0000 mL | ORAL_SOLUTION | Freq: Three times a day (TID) | ORAL | 0 refills | Status: DC | PRN
Start: 2020-09-13 — End: 2020-10-15

## 2020-09-13 NOTE — Addendum Note (Signed)
Addended byBrand Males on: 09/13/2020 01:22 PM   Modules accepted: Orders

## 2020-09-13 NOTE — Telephone Encounter (Signed)
LMOM for pt that refill for Hycodan was completed and sent to the [facility].

## 2020-09-13 NOTE — Addendum Note (Signed)
Addended by: Rosana Berger on: 09/13/2020 01:05 PM   Modules accepted: Orders

## 2020-09-13 NOTE — Telephone Encounter (Signed)
done

## 2020-09-13 NOTE — Telephone Encounter (Signed)
LEslie  The script seems disappared. Can yo uredo please?  Thanks  MR

## 2020-09-13 NOTE — Telephone Encounter (Signed)
Sent back rx pending

## 2020-09-16 NOTE — Telephone Encounter (Signed)
Medication was sent to pharmacy on 09/13/2020. Will sign off.

## 2020-09-18 DIAGNOSIS — J84112 Idiopathic pulmonary fibrosis: Secondary | ICD-10-CM | POA: Diagnosis not present

## 2020-09-24 DIAGNOSIS — G4733 Obstructive sleep apnea (adult) (pediatric): Secondary | ICD-10-CM | POA: Diagnosis not present

## 2020-09-24 DIAGNOSIS — J84112 Idiopathic pulmonary fibrosis: Secondary | ICD-10-CM | POA: Diagnosis not present

## 2020-09-25 DIAGNOSIS — G4733 Obstructive sleep apnea (adult) (pediatric): Secondary | ICD-10-CM | POA: Diagnosis not present

## 2020-09-28 DIAGNOSIS — E785 Hyperlipidemia, unspecified: Secondary | ICD-10-CM | POA: Diagnosis not present

## 2020-09-28 DIAGNOSIS — I10 Essential (primary) hypertension: Secondary | ICD-10-CM | POA: Diagnosis not present

## 2020-10-12 ENCOUNTER — Telehealth: Payer: Self-pay | Admitting: Internal Medicine

## 2020-10-12 DIAGNOSIS — K219 Gastro-esophageal reflux disease without esophagitis: Secondary | ICD-10-CM | POA: Insufficient documentation

## 2020-10-12 DIAGNOSIS — R42 Dizziness and giddiness: Secondary | ICD-10-CM | POA: Insufficient documentation

## 2020-10-12 DIAGNOSIS — G4733 Obstructive sleep apnea (adult) (pediatric): Secondary | ICD-10-CM | POA: Diagnosis not present

## 2020-10-12 DIAGNOSIS — E785 Hyperlipidemia, unspecified: Secondary | ICD-10-CM | POA: Insufficient documentation

## 2020-10-12 DIAGNOSIS — T7840XA Allergy, unspecified, initial encounter: Secondary | ICD-10-CM | POA: Insufficient documentation

## 2020-10-12 DIAGNOSIS — R011 Cardiac murmur, unspecified: Secondary | ICD-10-CM | POA: Insufficient documentation

## 2020-10-12 DIAGNOSIS — K449 Diaphragmatic hernia without obstruction or gangrene: Secondary | ICD-10-CM | POA: Insufficient documentation

## 2020-10-12 DIAGNOSIS — M1990 Unspecified osteoarthritis, unspecified site: Secondary | ICD-10-CM | POA: Insufficient documentation

## 2020-10-12 MED ORDER — SODIUM CHLORIDE 3 % IN NEBU: INHALATION_SOLUTION | RESPIRATORY_TRACT | 5 refills | Status: DC

## 2020-10-12 NOTE — Telephone Encounter (Signed)
Called and spoke with pt letting him know that original pharmacy of prevo drug that we sent hypertonic sol to is not able to get it at this time and asked him if he would like for [place] to try other pharmacy on file and pt stated that would be fine. [facility].  Called [facility] drug and spoke with Otila Kluver letting her know that we were going to send pt's Rx to a different pharmacy for pt to see if they are able to get it for pt and she verbalized understanding. Nothing further needed.

## 2020-10-13 ENCOUNTER — Encounter: Payer: Self-pay | Admitting: Cardiology

## 2020-10-13 ENCOUNTER — Ambulatory Visit: Payer: PPO | Admitting: Cardiology

## 2020-10-13 ENCOUNTER — Telehealth: Payer: Self-pay | Admitting: Internal Medicine

## 2020-10-13 ENCOUNTER — Other Ambulatory Visit: Payer: Self-pay

## 2020-10-13 VITALS — BP 156/80 | HR 74 | Ht 66.0 in | Wt 244.6 lb

## 2020-10-13 DIAGNOSIS — E782 Mixed hyperlipidemia: Secondary | ICD-10-CM

## 2020-10-13 DIAGNOSIS — I7090 Unspecified atherosclerosis: Secondary | ICD-10-CM

## 2020-10-13 DIAGNOSIS — I10 Essential (primary) hypertension: Secondary | ICD-10-CM | POA: Diagnosis not present

## 2020-10-13 DIAGNOSIS — G4730 Sleep apnea, unspecified: Secondary | ICD-10-CM

## 2020-10-13 HISTORY — DX: Morbid (severe) obesity due to excess calories: E66.01

## 2020-10-13 NOTE — Progress Notes (Signed)
Cardiology Office Note:    Date:  10/13/2020   ID:  [name], DOB [birth year], MRN [id number]  PCP:  Ronita Hipps, MD  Cardiologist:  Jenean Lindau, MD   Referring MD: Ronita Hipps, MD    ASSESSMENT:    1. ASVD (arteriosclerotic vascular disease)   2. Mixed hyperlipidemia   3. Primary hypertension   4. Sleep apnea, unspecified type   5. Morbid obesity ([place])    PLAN:    In order of problems listed above:  1. Coronary artery disease: Secondary prevention stressed with the patient.  Importance of compliance with diet medication stressed any vocalized understanding. 2. Essential hypertension: Blood pressure stable and diet was emphasized.  His blood pressures are fine at home. 3. Mixed dyslipidemia: Lipids were reviewed.  They are done by primary care physician.  He keeps a track of his diet and tries to do his best. 4. Sleep apnea: Sleep health issues were discussed. 5. Respiratory failure on supplemental oxygen: Managed by his pulmonologist.  Stable. 6. Morbid obesity: Diet was emphasized weight reduction was stressed and he promises to do better. 7. Patient will be seen in follow-up appointment in 6 months or earlier if the patient has any concerns    Medication Adjustments/Labs and Tests Ordered: Current medicines are reviewed at length with the patient today.  Concerns regarding medicines are outlined above.  No orders of the defined types were placed in this encounter.  No orders of the defined types were placed in this encounter.    No chief complaint on file.    History of Present Illness:    Stephen [name] is a 69 y.o. male.  Patient has past medical history of coronary artery disease, essential hypertension dyslipidemia, sleep apnea and morbid obesity.  He has chronic respiratory failure with oxygen use and dependency for supplemental oxygen.  He denies any problems at this time and takes care of activities of daily living.  No chest pain orthopnea or  PND.  At the time of my evaluation, the patient is alert awake oriented and in no distress.  Past Medical History:  Diagnosis Date  . Abnormality of gait 01/21/2014  . Allergy   . Arthritis   . ASVD (arteriosclerotic vascular disease) 02/06/2018  . Benign paroxysmal positional vertigo 01/21/2014  . Chronic respiratory failure with hypoxia ([place]) 02/13/2020  . Dizziness 03/14/2017  . GERD (gastroesophageal reflux disease)   . Heart murmur   . Hiatal hernia   . Hypercholesteremia 02/05/2018  . Hyperlipidemia   . Hypertension   . Idiopathic pulmonary fibrosis ([place]) 09/11/2018   12/31/2018-CT chest high-res- interval progression of basilar predominant fibrotic interstitial lung disease without frank honeycombing, categorized as probable UIP  10/03/2018-pulmonary function test-spirometry-FVC 2.42 (65% predicted), ratio 91, FEV1 2.22 (80% predicted) >>>Patient attempted to complete DLCO today was unable to complete DLCO due to severe coughing  03/05/2018- [place] pulmonary inter  . Obesity 02/05/2018  . Overweight 09/11/2018  . Post-traumatic headache 01/21/2014  . Research study patient 11/21/2019  . Sleep apnea   . Vertigo     Past Surgical History:  Procedure Laterality Date  . CARDIAC CATHETERIZATION  03/22/2001  . LEG SURGERY Bilateral 04/2001    Current Medications: Current Meds  Medication Sig  . amitriptyline (ELAVIL) 75 MG tablet Take 75 mg by mouth at bedtime.  Marland Kitchen amLODipine (NORVASC) 10 MG tablet Take 10 mg by mouth daily.  Marland Kitchen aspirin EC 81 MG tablet Take 81 mg by mouth daily.  Marland Kitchen  calcium carbonate (OSCAL) 1500 (600 Ca) MG TABS tablet Take by mouth 2 (two) times daily with a meal.  . calcium carbonate (TUMS - DOSED IN MG ELEMENTAL CALCIUM) 500 MG chewable tablet Chew 1 tablet by mouth daily.  . cholecalciferol (VITAMIN D) 1000 units tablet Take 1,000 Units by mouth daily.  . cloNIDine (CATAPRES) 0.3 MG tablet Take 0.3 mg by mouth daily after supper.   . dicyclomine (BENTYL) 10 MG capsule  Take 10 mg by mouth 3 (three) times daily before meals.   . ESBRIET 267 MG CAPS Take 3 capsules three times a day with food.  . gabapentin (NEURONTIN) 300 MG capsule Take 1 capsule (300 mg total) by mouth 3 (three) times daily.  Marland Kitchen HYDROcodone-homatropine (HYCODAN) 5-1.5 MG/5ML syrup Take 5 mLs by mouth 3 (three) times daily as needed for cough.  . lansoprazole (PREVACID) 30 MG capsule Take 30 mg by mouth daily at 12 noon.  . Methylcellulose, Laxative, (CITRUCEL PO) Take 1 Scoop by mouth.  . metoprolol tartrate (LOPRESSOR) 50 MG tablet Take 1 tablet by mouth 2 (two) times daily.  . montelukast (SINGULAIR) 10 MG tablet Take 10 mg by mouth at bedtime.  . Multiple Vitamin (MULTIVITAMIN WITH MINERALS) TABS tablet Take 1 tablet by mouth daily.  . nabumetone (RELAFEN) 750 MG tablet Take 750 mg by mouth 2 (two) times daily.  Marland Kitchen olmesartan (BENICAR) 20 MG tablet Take 1 tablet by mouth daily.  . polyethylene glycol (MIRALAX / GLYCOLAX) packet Take 17 g by mouth daily.  . Probiotic Product (PROBIOTIC DAILY PO) Take 1 capsule by mouth daily.  . rosuvastatin (CRESTOR) 10 MG tablet Take 10 mg by mouth daily.  . sodium chloride 1 g tablet Take 3 g by mouth 3 (three) times daily.   . sodium chloride HYPERTONIC 3 % nebulizer solution 3 ml via neb twice daily  . spironolactone (ALDACTONE) 25 MG tablet Take 25 mg by mouth 3 (three) times a week.  Marland Kitchen UNABLE TO FIND Take 1 capsule by mouth 3 (three) times daily. Med Name: blood sugar harmony  . vitamin B-12 (CYANOCOBALAMIN) 1000 MCG tablet Take 1,000 mcg by mouth daily.  . vitamin E 400 UNIT capsule Take 400 Units by mouth daily.     Allergies:   Ciprofloxacin, Codeine, and Other   Social History   Socioeconomic History  . Marital status: Married    Spouse name: Not on file  . Number of children: Not on file  . Years of education: Not on file  . Highest education level: Not on file  Occupational History  . Not on file  Tobacco Use  . Smoking status:  Never Smoker  . Smokeless tobacco: Never Used  Vaping Use  . Vaping Use: Never used  Substance and Sexual Activity  . Alcohol use: Not on file  . Drug use: Not on file  . Sexual activity: Not on file  Other Topics Concern  . Not on file  Social History Narrative  . Not on file   Social Determinants of Health   Financial Resource Strain: Not on file  Food Insecurity: Not on file  Transportation Needs: Not on file  Physical Activity: Not on file  Stress: Not on file  Social Connections: Not on file     Family History: The patient's family history includes Aneurysm in his father and maternal grandmother; Cancer in his maternal grandmother; Dementia in his mother; Heart attack in his father.  ROS:   Please see the history of present illness.  All other systems reviewed and are negative.  EKGs/Labs/Other Studies Reviewed:    The following studies were reviewed today: EKG reveals sinus rhythm left axis deviation and nonspecific ST-T changes.   Recent Labs: 04/12/2020: BUN 7; Creatinine, Ser 0.70; Potassium 4.5; Sodium 128; TSH 2.930 05/19/2020: ALT 19  Recent Lipid Panel No results found for: CHOL, TRIG, HDL, CHOLHDL, VLDL, LDLCALC, LDLDIRECT  Physical Exam:    VS:  BP (!) 156/80   Pulse 74   Ht _0  (1.676 m)   Wt 244 lb 9.6 oz (110.9 kg)   SpO2 97%   BMI 39.48 kg/m     Wt Readings from Last 3 Encounters:  10/13/20 244 lb 9.6 oz (110.9 kg)  06/15/20 241 lb 9.6 oz (109.6 kg)  05/19/20 243 lb 9.6 oz (110.5 kg)     GEN: Patient is in no acute distress HEENT: Normal NECK: No JVD; No carotid bruits LYMPHATICS: No lymphadenopathy CARDIAC: Hear sounds regular, 2/6 systolic murmur at the apex. RESPIRATORY:  Clear to auscultation without rales, wheezing or rhonchi  ABDOMEN: Soft, non-tender, non-distended MUSCULOSKELETAL:  No edema; No deformity  SKIN: Warm and dry NEUROLOGIC:  Alert and oriented x 3 PSYCHIATRIC:  Normal affect   Signed, Jenean Lindau, MD   10/13/2020 3:46 PM    [facility]

## 2020-10-13 NOTE — Telephone Encounter (Signed)
[facility] and [facility] they do not have AND cannot get hyertonic solution they have 2 warehouses they get from and they aren't getting it from either. States they are the 2nd pharmacy this patient has tried to get solution from.  Not sure what the first pharmacy was .  Letting Dr. Chase Caller know to see if he has any recommendations

## 2020-10-13 NOTE — Patient Instructions (Signed)

## 2020-10-15 NOTE — Telephone Encounter (Signed)
Spoke with patient. He stated that he is ok with continuing to wait on the saline solution. He wanted to know if MR would send in a refill on his Hydromet cough syrup. He stated that the instructions state for him to use the solution 3x times daily if needed for cough, but he has been using it only at night.   Pharm[facility] [place].   MR, please advise.

## 2020-10-15 NOTE — Telephone Encounter (Signed)
RX has been pended.

## 2020-10-15 NOTE — Telephone Encounter (Signed)
We just have to wait for his hypertonic saline.  Meanwhile he can continue his current treatment for cough.  In a few months will have a cough research study for pulmonary fibrosis patients and at that time we can enroll him.  He is on the list for that

## 2020-10-15 NOTE — Telephone Encounter (Signed)
Yes pls do thethe hydromet and I Can do e-sign next week

## 2020-10-18 DIAGNOSIS — J84112 Idiopathic pulmonary fibrosis: Secondary | ICD-10-CM | POA: Diagnosis not present

## 2020-10-18 MED ORDER — HYDROCODONE-HOMATROPINE 5-1.5 MG/5ML PO SYRP
5.0000 mL | ORAL_SOLUTION | Freq: Three times a day (TID) | ORAL | 0 refills | Status: DC | PRN
Start: 2020-10-18 — End: 2020-11-17

## 2020-10-18 NOTE — Telephone Encounter (Signed)
done

## 2020-10-19 ENCOUNTER — Other Ambulatory Visit: Payer: Self-pay | Admitting: Internal Medicine

## 2020-10-19 NOTE — Telephone Encounter (Signed)
Pt calling requesting to have a refill of his Tussionex cough syrup. Rx has been pended. MR, please advise.

## 2020-10-24 DIAGNOSIS — J84112 Idiopathic pulmonary fibrosis: Secondary | ICD-10-CM | POA: Diagnosis not present

## 2020-10-24 DIAGNOSIS — G4733 Obstructive sleep apnea (adult) (pediatric): Secondary | ICD-10-CM | POA: Diagnosis not present

## 2020-10-28 DIAGNOSIS — R051 Acute cough: Secondary | ICD-10-CM | POA: Diagnosis not present

## 2020-10-28 DIAGNOSIS — Z20828 Contact with and (suspected) exposure to other viral communicable diseases: Secondary | ICD-10-CM | POA: Diagnosis not present

## 2020-10-28 DIAGNOSIS — E7800 Pure hypercholesterolemia, unspecified: Secondary | ICD-10-CM | POA: Diagnosis not present

## 2020-10-28 DIAGNOSIS — E119 Type 2 diabetes mellitus without complications: Secondary | ICD-10-CM | POA: Diagnosis not present

## 2020-10-28 DIAGNOSIS — I10 Essential (primary) hypertension: Secondary | ICD-10-CM | POA: Diagnosis not present

## 2020-11-01 NOTE — Telephone Encounter (Signed)
MR please advise on tussionex refill request.  Nothing further needed at this time- will close encounter.

## 2020-11-17 ENCOUNTER — Encounter: Payer: Self-pay | Admitting: Internal Medicine

## 2020-11-17 ENCOUNTER — Other Ambulatory Visit: Payer: Self-pay

## 2020-11-17 ENCOUNTER — Ambulatory Visit (INDEPENDENT_AMBULATORY_CARE_PROVIDER_SITE_OTHER): Payer: PPO | Admitting: Internal Medicine

## 2020-11-17 VITALS — BP 128/72 | HR 71 | Ht 66.0 in | Wt 242.8 lb

## 2020-11-17 DIAGNOSIS — R053 Chronic cough: Secondary | ICD-10-CM

## 2020-11-17 DIAGNOSIS — Z5181 Encounter for therapeutic drug level monitoring: Secondary | ICD-10-CM

## 2020-11-17 DIAGNOSIS — Z8616 Personal history of COVID-19: Secondary | ICD-10-CM

## 2020-11-17 DIAGNOSIS — J84112 Idiopathic pulmonary fibrosis: Secondary | ICD-10-CM | POA: Diagnosis not present

## 2020-11-17 LAB — HEPATIC FUNCTION PANEL
ALT: 19 U/L (ref 0–53)
AST: 24 U/L (ref 0–37)
Albumin: 4.1 g/dL (ref 3.5–5.2)
Alkaline Phosphatase: 93 U/L (ref 39–117)
Bilirubin, Direct: 0.1 mg/dL (ref 0.0–0.3)
Total Bilirubin: 0.4 mg/dL (ref 0.2–1.2)
Total Protein: 7.5 g/dL (ref 6.0–8.3)

## 2020-11-17 NOTE — Progress Notes (Signed)
IOV 03/05/2018  Chief Complaint  Patient presents with  . Consult    Referral per MD Helene Kelp, Dec 2018 had sinus infection and still has cough, hx of mild bronchiectasis, later was given symbicort which patient believes is making him more SOB.      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  He lives in [facility] and presents with his wife for evaluation of pulmonary fibrosis to the ILD clinic.  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  During this time he was not exposed to any metal dust but he was exposed to mold and damp environments intermittently but not always.  He denies any mold or mildew in the house.  He tells me that in December 2018 he had a sinus infection and since then has a lingering cough that never really went away.  It looks like from the primary care physician he saw dentist and from there he had imaging and this finally all resulted in a high-resolution CT chest January 21, 2018 that I personally visualized.  The official report is that it is indeterminate for UIP.  In my personal visualization there is bilateral bibasal symmetric disease with definite of craniocaudal gradient.  The septal thickening and also cylindrical bronchiectasis and peripheral bronchiec bronchiolectasis.  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  He does not have much of her shortness of breath but then he is morbidly obese and has balance issues and is quite limited.  He was seen by a local pulmonologist will give him a 2-5-year survival rate and therefore he is frustrated.  He was started on Symbicort which actually made things worse for him.  [place] pulmonary interstitial lung disease questionnaire  Symptoms: Since dec 2019. Some sputjm +. Better since started. Insidious dyspnea x 4 months. Same since onset. No episodic  dyspnea. lLeve 4 dyspnea walking wiuth peers and level 5 walking up hill/stairs. Level 1 for dressing, Level 2 fo rshopping and mpwing lawn  ROS: chronic balance issues affecting mobiluity.  In association with this he has fatigue for the last 3 years.  He has dry mouth for the last several years associated with some dysphagia for the last 10 years heartburn and acid reflux for several years.  Daytime set sleepiness for the last several years but denies any oral ulcers or rash or weight loss or recurrent fever or arthralgia  Past medical history: Denies any asthma COPD or heart failure rheumatoid arthritis or scleroderma or systemic lupus erythematosus or polymyositis or Sjogren's but is positive for sleep apnea and hiatal hernia.  Negative for pulmonary hypertension diabetes thyroid disease stroke seizures hepatitis tuberculosis kidney disease blood clots heart disease other than murmur pleurisy  Family history of pulmonary disease: Positive for asthma in the mother but nobody with pulmonary fibrosis  Personal exposure history: He has never smoked cigarettes did smoke cigars for 3 years.  [place]  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  During this time there is intermittent mold  exposure  Pulmonary toxicity history denies a laundry list of pulmonary toxicity medications    OV 04/11/2018  Chief Complaint  Patient presents with  . Follow-up    PFT performed today.  Pt is still coughing with occ. white mucus and pt also states he believes SOB is worse and is becoming SOB more easily.    Follow-up interstitial lung disease work-up  Symptoms: In the interim no new symptoms.  He does admit to having  chronic arthralgia for many decades after sheetrock fell on him.  He also has joint stiffness especially in the hands but he does not know if it is early morning stiffness.  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 Lorin Picket.  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  His autoimmune and vascular does panel shows trace positivity for ANCA and cyclic citrulline peptide.    OV 06/04/2018   Chief Complaint  Patient presents with  . Follow-up    Pt states things have been about the same since last visit.   FU ILD - ccp and atypical p-anca trace positive May 2019. HP panel negative.  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 Lung function: Severity -FVC 63% and DLCO 58% showing moderate ILD. Rheum evaluation - 04/25/18 - no evidence of RA (Dr Trudie Reed)  Here with his wife to discuss test results. In the interim he saw Dr. Gavin Pound on 04/25/2018. She is of rheumatology. Clinically she did not find any evidence of systemic rheumatoid arthritis or vasculitis. Therefore he is here for decision making regarding his ILD. In the interim there are no new issues.  OV 07/16/2018  Subjective:  Patient ID: Stephen [name], male , DOB: 1951-09-04 , age 60 y.o. , MRN: [id number] , ADDRESS: [address] [place] [address]   07/16/2018 -   Chief Complaint  Patient presents with  . Follow-up    Pt started OFEV 8/11 and so far denies any complaints.    FU ILD - ccp and atypical p-anca trace positive May 2019. HP panel negative.  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 Lung function: Severity -FVC 63% and DLCO 58% showing moderate ILD. Rheum evaluation - 04/25/18 - no evidence of RA (Dr Trudie Reed). Dx of IPF given 06/04/18. STarted ofev 8/11/9    HPI DQUAN CORTOPASSI 70 y.o. - presents for follow-up of his idiopathic pulmonary fibrosis. Diagnoses given 06/04/2018. He is started on  Ofev 06/09/2018. He is here with his wife. He says he is tolerating the Ofev just fine. No GI issues. He gets support from the Child psychotherapist Clarene Critchley. He will have a liver function test today. He deferred flu shot because he will have it with his primary care physician. He told me that he is planning to join the pulmonary fibrosis foundation patient support group. Next meeting is 07/25/2018 Thursday 6 PM at [facility]. He is also willing to join research trials.He has mobility issues on account of his obesity and cane use. He says he cannot do a treadmill or walk long distances.   He has chronic tinnitus and apparently this is from working in [employer] base during [place] War although he was never deployed overseas. He started getting VA eligibility. He asked [place] to fill forms. ROS - per HPI  OV 09/03/2018  Subjective:  Patient ID:  Stephen [name], male , DOB: 1951-07-08 , age 11 y.o. , MRN: [id number] , ADDRESS: [address] [place] [address]   09/03/2018 -   Chief Complaint  Patient presents with  . Follow-up    Doing well at this time coughing up white mucus     FU ILD - ccp and atypical p-anca trace positive May 2019. HP panel negative.  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 Lung function: Severity -FVC 63% and DLCO 58% showing moderate ILD. Rheum evaluation - 04/25/18 - no evidence of RA (Dr Trudie Reed). Dx of IPF given 06/04/18. STarted ofev 8/11/9   HPI ILYA NEELY 70 y.o. -returns for follow-up of his IPF.  He has been on nintedanib since mid August 2019.  He is here with his wife.  So far is tolerating the nintedanib just fine.  He says that his baseline constipation still continues and he needs both MiraLAX and Citrucel.  However what he notices that when he has relief of constipation it is diarrhea.  He says he is not able to come down on the laxative dose because otherwise the constipation returns.  He feels the 05 is not causing any diarrhea.  Is no  abdominal pain nausea vomiting.  In terms of shortness of breath is stable.  He tried to have pulmonary function test as follow-up like a few weeks ago but he started coughing and did not produce enough spirometry's.  All we have the symptoms which is stable.  He is up-to-date with his flu shot.  He is interested in research protocols.  He uses a cane normally.      OV 12/17/2018  Subjective:  Patient ID: Stephen [name], male , DOB: 02/15/1951 , age 47 y.o. , MRN: [id number] , ADDRESS: [address] [place] [address]   12/17/2018 -   Chief Complaint  Patient presents with  . Follow-up    PFT performed today but pt was unable to perform DLCO due to coughing. Pt states SOB is about the same. Denies any CP or chest tightness.    FU ILD - ccp and atypical p-anca- ALL trace positive May 2019. HP panel negative.  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 Lung function: Severity -FVC 63% and DLCO 58% showing moderate ILD. Rheum evaluation - 04/25/18 - no evidence of RA (Dr Trudie Reed). Dx of IPF given 06/04/18. STarted ofev 8/11/9   HPI GRACESON NICHELSON 70 y.o. -IPF follow-up.  He presents with his wife as usual.  He tells me that starting January 2020 nintedanib started causing significant diarrhea.  By the third week of January 2020 he reduced himself to 1 tablet once daily at 150 mg.  With this there is no diarrhea.  He says that at the full dose he would take Imodium for diarrhea and then he would get constipated for several days.  He would follow this with a laxative and then he would have explosive diarrhea.  He said the symptoms became unmanageable.  In terms of his IPF itself he is stable.  His other main issues worsening chronic cough.  He has had chronic cough for b for several years predating IPF diagnosis.  He has been on ACE inhibitor's but it appears that lisinopril got rotated to benazepril and then to losartan but the cough never improved.  So he got switch back to benazepril.  He says that his  primary care team is aware that all these agents can  cause cough but the emphasis was on controlling the blood pressure.  He says the cough is severe.  Mostly in the daytime very rarely at night.  There is a laryngeal hoarse quality to his cough.  Noticed results on fish oil and carvedilol.  We discussed about neuropathic treatment for cough but he is already on amitriptyline ROS - per HPI   OV 06/30/2019  Subjective:  Patient ID: Stephen [name], male , DOB: Dec 02, 1950 , age 12 y.o. , MRN: [id number] , ADDRESS: [address] [place] [address]   06/30/2019 -   Chief Complaint  Patient presents with  . IPF (idiopathic pulmonary fibrosis)    Does not feel the breathing is any different than last visit. Still using Ofev, but still having diarrhea with it.     FU ILD - ccp and atypical p-anca- ALL trace positive May 2019. HP panel negative.  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 Lung function: Severity -FVC 63% and DLCO 58% showing moderate ILD. Rheum evaluation - 04/25/18 - no evidence of RA (Dr Trudie Reed). Dx of IPF given 06/04/18. STarted ofev 06/09/18  HPI FINCH COSTANZO 70 y.o. -presents for follow-up with his wife.  He has a diagnosis of IPF.  He has been on nintedanib since August 2019.  He preferred to take nintedanib initially because of his preference to have diarrhea.  At this point in time he tells me that he is having progressive dyspnea.  Early in the spring 2020 he noticed that he could not walk back from his office to the front door.  He could do this in 2019.  He also has had difficulty mowing the lawn with the more.  In addition is having significant diarrhea from the nintedanib.  It is severe despite agents.  He is willing to change the drug.  In between he saw the nurse practitioner he had a high-resolution CT chest in August 2020.  He has mildly progressive disease even compared to March 2020.  Walking desaturation test and symptom scores show evidence of progression and documented  below.       OV 08/27/2019  Subjective:  Patient ID: Stephen [name], male , DOB: February 20, 1951 , age 28 y.o. , MRN: [id number] , ADDRESS: [address] [place] [address]  FU IPF  - ccp and atypical p-anca- ALL trace positive May 2019. HP panel negative.  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 Lung function: Severity -FVC 63% and DLCO 58% showing moderate ILD. Rheum evaluation - 04/25/18 - no evidence of RA (Dr Trudie Reed). Dx of IPF given 06/04/18. STarted ofev 8/11/9. STopped Aug 2020 due to progression and diarrhea. STrted Esbriet -July 25, 2019   08/27/2019 -   Chief Complaint  Patient presents with  . Follow-up    Patient reports that his breathing is about the same. He has sob with exertion and productive cough.      HPI JHONNIE ALIANO 70 y.o. -presents with his wife for follow-up.  He tells me that Jacklynn Bue was started on July 25, 2019.  He took 3 weeks for it to come.  During this time he was off nintedanib and his diarrhea resolved. Wife tells me that he has been completely sedentary because of shortness of breath.  He feels the shortness of breath is getting worse.  He is also gained weight because of his sedentary lifestyle.  He is already morbidly obese.  He has pedal edema for which she  wears stockings.  He is not using oxygen at night because he is never needed it.  He is very concerned about the shortness of breath getting worse.  In fact it is worse even since his last visit in August 2020.  This is documented in the questionnaire below.  Also seen in the pulmonary function test.  His CT scan of the chest August 2020 shows worsening within 5 months.  I reviewed this with him.  He tells me that even taking a shower makes him desaturate although today he did not desaturate here in with a forehead probe to 88%.  However he did not do a 6-minute walk test.  He feels he would benefit from oxygen   IMPRESSION: HRCT Augu 2020 compared to March 2020 1. Spectrum of findings  compatible with basilar predominant fibrotic interstitial lung disease with probable early honeycombing at the right costophrenic angle. Slight interval progression. Findings are consistent with UIP per consensus guidelines: Diagnosis of Idiopathic Pulmonary Fibrosis: An Official ATS/ERS/JRS/ALAT Clinical Practice Guideline. [place], Iss 5, [phone], Jun 30 2017. 2. Mild mediastinal lymphadenopathy is stable, compatible with benign reactive adenopathy. 3. Three-vessel coronary atherosclerosis.  Aortic Atherosclerosis (ICD10-I70.0).   Electronically Signed   By: Ilona Sorrel M.D.   On: 06/28/2019 16:35  ROS - per HPI   OV 09/29/2019  Subjective:  Patient ID: Stephen [name], male , DOB: [birth year] , age 70 y.o. , MRN: [id number] , ADDRESS: [address] [place] [address]   FU IPF  -trace positive ccp and atypical p-anca- ALL trace positive May 2019 (repeat CCP - October 2020]. HP panel negative.  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 Lung function: Severity -FVC 63% and DLCO 58% showing moderate ILD. Rheum evaluation - 04/25/18 - no evidence of RA (Dr Trudie Reed). Dx of IPF given 06/04/18. STarted ofev 8/11/9. STopped Aug 2020 due to progression and diarrhea. STrted Esbriet -July 25, 2019   Associated sleep apnea present   Last CT 06/27/2019 Low risk cardiac stress test April 2019.   09/29/2019 -   Chief Complaint  Patient presents with  . Follow-up    Pt states his breathing has become worse since last visit and also has a worsening cough and is coughing up dark green phlegm which he has had for awhile now. Pt is on 2L O2 24/7.      HPI HICKS FEICK 70 y.o. -he is now attending pulmonary rehabilitation.  On September 16, 2019 he did have a 6-minute walk test.  He did desaturate to 80% from 96%.  He dropped his pulse ox to 86% at the second minut.  He did correct with 2 L of oxygen.  E also in the interim he did have overnight oxygen study  and we have recommended 2 L nasal cannula oxygen with the CPAP.  He is now wearing that.  Of note, during the overnight oxygen study his heart rate was 48-50.  Today he also had a 6-minute walk test at our office on 2 L oxygen and with this his pulse ox did not drop.  Overall he is reporting stability.  This can be demonstrated by the symptom questionnaire below.  He does have significant amount of shortness of breath despite wearing his 2 L.  Even though the 2 L helps his dyspnea he still says the dyspnea is significant.  He still has chronic significant pedal edema.  He continues to be morbidly obese.  We discussed his diet.  His wife states that he eats all the time and cannot stop eating.  Apparently he goes through a carton of ice cream every few days.  He says that he will not be able to control his dietary indiscretion.  He is not interested in lung transplant.  He is enjoying pulmonary rehabilitation.  Given his diastolic dysfunction I did refer him to his cardiologist but he says he does not have an appointment.    02/13/2020 Follow up : ILD  Patient presents for a 100-monthfollow-up.  Patient says overall breathing is doing about the same.  He has had no flare of his cough or shortness of breath.  Says his activity level is about at baseline.  Says he would like to be more active has been try to do some yard work however is very hard for him to carry his oxygen tanks.  Would like a portable oxygen concentrator.  Walk test in the office shows desaturation below 88% on room air.  Requires 2 L of oxygen to keep O2 saturations greater than 88%.  Patient gets short of breath with heavy activity.  Has to stop and rest several times.  He remains on Esbriet.  Denies any nausea vomiting diarrhea.    OV 05/19/2020   Subjective:  Patient ID: RLevin [name] male , DOB: 609-Oct-1952 age 70y.o. years. , MRN: [id number]  ADDRESS: [address] [place] [id number]  HRonita Hipps MD Providers : Treatment Team:   Attending Provider: RBrand Males MD  FU IPF  -trace positive ccp and atypical p-anca- ALL trace positive May 2019 (repeat CCP - October 2020]. HP panel negative.  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 Lung function: Severity -FVC 63% and DLCO 58% showing moderate ILD. Rheum evaluation - 04/25/18 - no evidence of RA (Dr HTrudie Reed. Dx of IPF given 06/04/18. STarted ofev 8/11/9. STopped Aug 2020 due to progression and diarrhea. STrted Esbriet -July 25, 2019.   Chief Complaint  Patient presents with  . Follow-up    Productive cough with brownish-yellow phlegm       HPI RJASIYAH PAULDING89y.o. -presents for follow-up.  Last seen in #2020 as standard of care.  After that he was on the [place] which prematurely ended.  He continues in his bed without any problems.  He has not lost any weight.  However he is having progressive shortness of breath and progressive cough.  Particularly worse in the last 1 month.  In the last few days the cough is even more worse.  Last night he was coughing all night.  He is not able to talk without coughing.  When he takes a shower with 2 L nasal cannula he desaturates to 84%.  When he walks in the mall with 2 L nasal cannula he desaturates to 84%.  He has been asked to uptitrate his oxygen to 4 L with exertion but is not done that.  He is also got some brown-yellow sputum for the last 1 month.  There is no wheezing.  Hycodan cough syrup is not helping.  He is frustrated by all this.  And so is his wife.  There is no edema chest pain.  He easily desaturates.    06/09/2020- Interim hx Patient contacted today for virtual telephone visit/2 to 3-week follow-up ILD. Cough may be a litle bit better but is still productive with yellow/brown mucus. Cough is not new. Hydromet medication does  help. He did not notice any difference after completing doxycycline/prednisone course. No increase in oxygen demand. Denies fever, chest tightness, wheezing.      OV  06/15/2020   Subjective:  Patient ID: Stephen [name], male , DOB: 1951/03/07, age 47 y.o. years. , MRN: [id number],  ADDRESS: [address] [place] [address] PCP  Ronita Hipps, MD Providers : Treatment Team:  Attending Provider: Brand Males, MD    FU IPF  -trace positive ccp and atypical p-anca- ALL trace positive May 2019 (repeat CCP - October 2020]. HP panel negative.  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 Lung function: Severity -FVC 63% and DLCO 58% showing moderate ILD. Rheum evaluation - 04/25/18 - no evidence of RA (Dr Trudie Reed). Dx of IPF given 06/04/18. STarted ofev 8/11/9. STopped Aug 2020 due to progression and diarrhea. STrted Esbriet -July 25, 2019 Gabapentin for cough July  2021  DNR since aug 2021    Chief Complaint  Patient presents with  . Follow-up    Pt states that he is about the same since last visit. States still has problems with SOB.       HPI MIKLOS BIDINGER 70 y.o. -presents for follow-up of his IPF.  He is tolerating pirfenidone after last visit in July 2021.  At that visit cough is the dominant feature.  We started him on gabapentin once daily at night.  He has tolerated it well.  He says the cough is improved somewhat but not significantly with it.  It is definitely helping him though.  Doxycycline and prednisone did not help him.  In fact the prednisone burst made him a little bit restless.  He is now requiring 3 L of oxygen at rest.  Previously it was 2 L.  His last CT scan of the chest was December 2020.  In the interim he saw a Designer, jewellery.  He is confirmed his DNR status.  He is really frustrated by the cough.  He feels that his sputum inside but unable to cough.  He is gagging and is trying to clear his throat a lot.  In addition his wife tells me last week for a few days he had bilateral leg weakness and had increased imbalance issues.  However it was not a focal neurologic deficit.  There is no bladder or bowel problems.  It has since  resolved.  There is no speech issues.  There is no back pain.  They are to talk to the primary care physician about this.      OV 08/24/2020  Subjective:  Patient ID: Stephen [name], male , DOB: [birth year] , age 39 y.o. , MRN: [id number] , ADDRESS: [address] [place] [address] PCP Ronita Hipps, MD Patient Care Team: Ronita Hipps, MD as PCP - General (Family Medicine)  This Provider for this visit: Treatment Team:  Attending Provider: Brand Males, MD  Type of visit: Telephone/Video Circumstance: COVID-19 national emergency Identification of patient GERY SABEDRA with 1950/11/20 and MRN [id number] - 2 person identifier Risks: Risks, benefits, limitations of telephone visit explained. Patient understood and verbalized agreement to proceed Anyone else on call: yes - wife Patient location:his home This provider location: home due to extenutating circumstance   08/24/2020 - FU IPF   FU IPF  -trace positive ccp and atypical p-anca- ALL trace positive May 2019 (repeat CCP - October 2020]. HP panel negative.  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 Lung  function: Severity -FVC 63% and DLCO 58% showing moderate ILD. Rheum evaluation - 04/25/18 - no evidence of RA (Dr Trudie Reed). Dx of IPF given 06/04/18. STarted ofev 8/11/9. STopped Aug 2020 due to progression and diarrhea. STrted Esbriet -July 25, 2019 .Screen Failed Pliant study due to severe cough - Sept 2021  Severe cough   - Gabapentin for cough July  2021  - 3% saline - since Aug 2021  - hycodan qhs  - Unable to do PFT  DNR but full medical care since aug 2021  On 3LNC   HPI MARQUES ERICSON 70 y.o. - telephone visit. Joined by wife. Says dyspnea stable on 3L [place] continuous and baseline. Says cough improved with rx of gabapentin, 3% saline and hycodan qhs. Wants refill on latter. Did not qualify for pliant study due to cough interfereing with PFT quality. Tolerating esbriet well. No new issues        PFT Results  Latest Ref Rng & Units 08/27/2019 10/03/2018 08/16/2018 04/11/2018  FVC-Pre L 2.18 2.42 2.53 2.57  FVC-Predicted Pre % 56 65 67 63  Pre FEV1/FVC % % 82 91 89 89  FEV1-Pre L 1.78 2.22 2.26 2.29  FEV1-Predicted Pre % 62 80 81 76  DLCO uncorrected ml/min/mmHg 13.67 - - 16.68  DLCO UNC% % 59 - - 58  DLVA Predicted % 97 - - 106    ROS - per HPI  IMPRESSION: HRCT Sep 2021 1. Redemonstrated pattern of moderate pulmonary fibrosis in a pattern with apical to basal gradient featuring irregular peripheral interstitial opacity, traction bronchiectasis, bronchiolectasis, and areas of honeycombing at the lung bases. Fibrotic findings are not significantly changed in comparison to examination dated 10/30/2019, but are clearly worsened over time on multiple prior examinations dating back to 02/01/2014. 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 2. Cardiomegaly and coronary artery disease. 3. Aortic Atherosclerosis (ICD10-I70.0).   Electronically Signed   By: Eddie Candle M.D.   On: 07/07/2020 09:20    OV 11/17/2020  Subjective:  Patient ID: Stephen [name], male , DOB: 09-13-1951 , age 31 y.o. , MRN: [id number] , ADDRESS: [address] [place] [address] PCP Ronita Hipps, MD Patient Care Team: Ronita Hipps, MD as PCP - General (Family Medicine)  This Provider for this visit: Treatment Team:  Attending Provider: Brand Males, MD    11/17/2020 -   Chief Complaint  Patient presents with  . Follow-up    Pt states he has been doing okay since last visit. States breathing may be a little worse since last visit. Pt was diagnosed with Covid 10/28/20 but just had mild symptoms.     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 HP panel negative.  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 Lung function: Severity -FVC  63% and DLCO 58% showing moderate ILD. Rheum evaluation - 04/25/18 - no evidence of RA (Dr Trudie Reed). Dx of IPF given 06/04/18. STarted ofev 8/11/9. STopped Aug 2020 due to progression and diarrhea. STrted Esbriet -July 25, 2019 .Screen Failed Pliant study due to severe cough - Sept 2021  Severe cough   - Gabapentin for cough July  2021  - 3% saline - since Aug 2021  - hycodan qhs  - Unable to do PFT  DNR but full medical care since aug  2021  On 3LNC    HPI MACKLIN JACQUIN 70 y.o. -presents with his wife.  He had a good holiday but on October 28, 2020 he developed omicron COVID.  He was barely symptomatic.  Wife had some sinus congestion.  He did not deteriorate.  He is doing well now.  This is following an exposure.  He continues his Esbriet.  He continues 3 L nasal cannula he feels stable.  His cough continues to be a problem.  He is not able to do 3% saline now because of backorder.  He is not doing Hycodan because it is too expensive but is willing for me to send another prescription.  Overall stable    CT Chest data  No results found.    SYMPTOM SCALE - ILD 2/18/ 2020  06/30/2019 Stop ofev 08/27/2019 esbriet since July 25, 2019 09/29/2019 esbriet 06/15/2020 243# 11/17/2020   O2 use RA ra ra  2 L 3L 3L  Shortness of Breath 0 -> 5 scale with 5 being worst (score 6 If unable to do)       At rest _0 Simple tasks - showers, clothes change, eating, shaving _1 Household (dishes, doing bed, laundry) x 0 x x  6  shopping      6  Walking level at own pace 1 1 x 3  6  Walking up Stairs _2 Total (40 - 48) Dyspnea Score _3 How bad is your cough? _4 How bad is your fatigue _5 0        0        0        0        0      PFT  PFT Results Latest Ref Rng & Units 08/27/2019 10/03/2018 08/16/2018 04/11/2018  FVC-Pre L 2.18 2.42 2.53 2.57  FVC-Predicted Pre % 56 65 67 63  Pre FEV1/FVC % % 82 91 89 89  FEV1-Pre L  1.78 2.22 2.26 2.29  FEV1-Predicted Pre % 62 80 81 76  DLCO uncorrected ml/min/mmHg 13.67 - - 16.68  DLCO UNC% % 59 - - 58  DLVA Predicted % 97 - - 106      Simple office walk 185 feet x  3 laps goal with forehead probe 03/05/2018  06/04/2018   09/03/2018  12/17/2018  06/30/2019 Stop ofev Start esbriet 08/27/2019  April 20210 05/19/2020   O2 used _6  Room air Needs 2L at rest, 3L exertion 92  Number laps completed All 3 laops All 3 laps all3 3 x 185 3     Comments about pace Walks very slow due to baseline balance issue "40% of balance only due to remote trauma  nomral mod pace using cane Slow with cane Slow with ane Slow with cane    Resting Pulse Ox/HR 100% and 55/min 100% and 57/min 100% and 59/min 100% and 61/min 97% and 56/min 98% and 65/min  92% ra at rest  Final Pulse Ox/HR 97% and 78/min 97% and 77/mi 98% and 76/min 95% and 91/min 91% and 87/min 92% and 94/mio    Desaturated </= 88% _7  no    Desaturated <= 3% points yes yes no Yes,  5 points Yes, 6 points Yes, 6 point    Got Tachycardic >/= 90/min no no no yes no yes    Symptoms at end of test No, denied dyspnea x none none none veryt dyspneic    Miscellaneous comments npne x x           has a past medical history of Abnormality of gait (01/21/2014), Allergy, Arthritis, ASVD (arteriosclerotic vascular disease) (02/06/2018), Benign paroxysmal positional vertigo (01/21/2014), Chronic respiratory failure with hypoxia ([place]) (02/13/2020), Dizziness (03/14/2017), GERD (gastroesophageal reflux disease), Heart murmur, Hiatal hernia, Hypercholesteremia (02/05/2018), Hyperlipidemia, Hypertension, Idiopathic pulmonary fibrosis ([place]) (09/11/2018), Obesity (02/05/2018), Overweight (09/11/2018), Post-traumatic headache (01/21/2014), Research study patient (11/21/2019), Sleep apnea, and Vertigo.   reports that he has never smoked. He has never used smokeless tobacco.  Past Surgical History:  Procedure  Laterality Date  . CARDIAC CATHETERIZATION  03/22/2001  . LEG SURGERY Bilateral 04/2001    Allergies  Allergen Reactions  . Ciprofloxacin Other (See Comments)    colitis  . Codeine     Immunization History  Administered Date(s) Administered  . Fluad Quad(high Dose 65+) 06/30/2019  . Influenza, High Dose Seasonal PF 08/28/2017, 07/19/2018, 07/22/2020  . PFIZER(Purple Top)SARS-COV-2 Vaccination 11/16/2019, 12/06/2019, 08/12/2020  . Pneumococcal Conjugate-13 08/25/2016  . Pneumococcal Polysaccharide-23 11/18/2007    Family History  Problem Relation Age of Onset  . Dementia Mother   . Heart attack Father   . Aneurysm Father   . Cancer Maternal Grandmother   . Aneurysm Maternal Grandmother      Current Outpatient Medications:  .  amitriptyline (ELAVIL) 75 MG tablet, Take 75 mg by mouth at bedtime., Disp: , Rfl:  .  amLODipine (NORVASC) 10 MG tablet, Take 10 mg by mouth daily., Disp: , Rfl:  .  aspirin EC 81 MG tablet, Take 81 mg by mouth daily., Disp: , Rfl:  .  calcium carbonate (OSCAL) 1500 (600 Ca) MG TABS tablet, Take by mouth 2 (two) times daily with a meal., Disp: , Rfl:  .  calcium carbonate (TUMS - DOSED IN MG ELEMENTAL CALCIUM) 500 MG chewable tablet, Chew 1 tablet by mouth daily., Disp: , Rfl:  .  cholecalciferol (VITAMIN D) 1000 units tablet, Take 1,000 Units by mouth daily., Disp: , Rfl:  .  cloNIDine (CATAPRES) 0.3 MG tablet, Take 0.3 mg by mouth daily after supper. , Disp: , Rfl:  .  dicyclomine (BENTYL) 10 MG capsule, Take 10 mg by mouth 3 (three) times daily before meals. , Disp: , Rfl:  .  ESBRIET 267 MG CAPS, Take 3 capsules three times a day with food., Disp: 270 capsule, Rfl: 11 .  gabapentin (NEURONTIN) 300 MG capsule, Take 1 capsule (300 mg total) by mouth 3 (three) times daily., Disp: 90 capsule, Rfl: 5 .  lansoprazole (PREVACID) 30 MG capsule, Take 30 mg by mouth daily at 12 noon., Disp: , Rfl:  .  Methylcellulose, Laxative, (CITRUCEL PO), Take 1 Scoop by  mouth., Disp: , Rfl:  .  metoprolol tartrate (LOPRESSOR) 50 MG tablet, Take 1 tablet by mouth 2 (two) times daily., Disp: , Rfl:  .  montelukast (SINGULAIR) 10 MG tablet, Take 10 mg by mouth at bedtime., Disp: , Rfl:  .  Multiple Vitamin (MULTIVITAMIN WITH MINERALS) TABS tablet, Take 1 tablet by mouth daily., Disp: , Rfl:  .  nabumetone (RELAFEN) 750 MG tablet, Take 750 mg by mouth 2 (two) times daily., Disp: , Rfl:  .  olmesartan (BENICAR) 20 MG tablet, Take 1 tablet by mouth daily.,  Disp: , Rfl:  .  polyethylene glycol (MIRALAX / GLYCOLAX) packet, Take 17 g by mouth daily., Disp: , Rfl:  .  Probiotic Product (PROBIOTIC DAILY PO), Take 1 capsule by mouth daily., Disp: , Rfl:  .  rosuvastatin (CRESTOR) 10 MG tablet, Take 10 mg by mouth daily., Disp: , Rfl:  .  sodium chloride 1 g tablet, Take 3 g by mouth 3 (three) times daily. , Disp: , Rfl:  .  spironolactone (ALDACTONE) 25 MG tablet, Take 25 mg by mouth 3 (three) times a week., Disp: , Rfl:  .  UNABLE TO FIND, Take 1 capsule by mouth 3 (three) times daily. Med Name: blood sugar harmony, Disp: , Rfl:  .  vitamin B-12 (CYANOCOBALAMIN) 1000 MCG tablet, Take 1,000 mcg by mouth daily., Disp: , Rfl:  .  vitamin E 400 UNIT capsule, Take 400 Units by mouth daily., Disp: , Rfl:  .  sodium chloride HYPERTONIC 3 % nebulizer solution, 3 ml via neb twice daily (Patient not taking: Reported on 11/17/2020), Disp: 180 mL, Rfl: 5      Objective:   Vitals:   11/17/20 1005  BP: 128/72  Pulse: 71  SpO2: 96%  Weight: 242 lb 12.8 oz (110.1 kg)  Height: 5' 6" (1.676 m)    Estimated body mass index is 39.19 kg/m as calculated from the following:   Height as of this encounter: 5' 6" (1.676 m).   Weight as of this encounter: 242 lb 12.8 oz (110.1 kg).  _0 @  Filed Weights   11/17/20 1005  Weight: 242 lb 12.8 oz (110.1 kg)     Physical Exam   General: No distress. obes Neuro: Alert and Oriented x 3. GCS 15. Speech normal Psych:  Pleasant Resp:  Barrel Chest - no.  Wheeze - no, Crackles - yes, No overt respiratory distress CVS: Normal heart sounds. Murmurs - no Ext: Stigmata of Connective Tissue Disease - no HEENT: Normal upper airway. PEERL +. No post nasal drip        Assessment:       ICD-10-CM   1. IPF (idiopathic pulmonary fibrosis) (HCC)  J84.112 Hepatic function panel  2. Chronic cough  R05.3   3. Therapeutic drug monitoring  Z51.81 Hepatic function panel  4. Personal history of COVID-19  Z86.16        Plan:     Patient Instructions     ICD-10-CM      ICD-10-CM   1. IPF (idiopathic pulmonary fibrosis) ([place])  J84.112   2. Chronic cough  R05.3   3. Therapeutic drug monitoring  Z51.81   4. Personal history of COVID-19  Z86.16     Glad you went through [place] without much problems.  I think the vaccine helped you fight it off Pulmonary fibrosis appears to be stable and glad you are managing on 3 L nasal cannula 24/7 Cough continues to be a problem   -Too bad  3% saline nebulizer is in  backorder  -Too bad that Hycodan is expensive now  Plan -Check LFT - continue esbriet for IPF  -Continue Delsym  - continue o2 3 L nasal cannula as before  - no more PFT testing due to cough atlest till cough improves -Refill Hycodan and see if he can price it out -Cough research trial for pulmonary fibrosis patient's COMFORT study when study is approved  Followup - 3 months do LFT test  - 3 months for face to face visit in 30 min slot   (  Level 05 visit: Estb 40-54 min   in  visit type: on-site physical face to visit  in total care time and counseling or/and coordination of care by this undersigned MD - Dr Brand Males. This includes one or more of the following on this same day 11/18/2020: pre-charting, chart review, note writing, documentation discussion of test results, diagnostic or treatment recommendations, prognosis, risks and benefits of management options, instructions, education, compliance  or risk-factor reduction. It excludes time spent by the [facility] or office staff in the care of the patient. Actual time 68 min)   SIGNATURE    Dr. Brand Males, M.D., F.C.C.P,  Pulmonary and Critical Care Medicine Staff Physician, [place] Director - Interstitial Lung Disease  Program  P[facility] [place] [facility] [place], [place], [address]  Pager: [phone], If no answer or between  15:00h - 7:00h: call [phone] Telephone: [phone]  10:51 AM 11/17/2020

## 2020-11-17 NOTE — Progress Notes (Signed)
LFT normal. Will not call with results  Lab             11/17/20                      1058         AST          24           ALT          19           ALKPHOS      93           BILITOT      0.4          PROT         7.5          ALBUMIN      4.1

## 2020-11-17 NOTE — Patient Instructions (Addendum)
ICD-10-CM      ICD-10-CM   1. IPF (idiopathic pulmonary fibrosis) ([place])  J84.112   2. Chronic cough  R05.3   3. Therapeutic drug monitoring  Z51.81   4. Personal history of COVID-19  Z86.16     Glad you went through [place] without much problems.  I think the vaccine helped you fight it off Pulmonary fibrosis appears to be stable and glad you are managing on 3 L nasal cannula 24/7 Cough continues to be a problem   -Too bad  3% saline nebulizer is in  backorder  -Too bad that Hycodan is expensive now  Plan -Check LFT - continue esbriet for IPF  -Continue Delsym  - continue o2 3 L nasal cannula as before  - no more PFT testing due to cough atlest till cough improves -Refill Hycodan and see if he can price it out -Cough research trial for pulmonary fibrosis patient's COMFORT study when study is approved  Followup - 3 months do LFT test  - 3 months for face to face visit in 30 min slot

## 2020-11-18 ENCOUNTER — Other Ambulatory Visit: Payer: Self-pay | Admitting: Internal Medicine

## 2020-11-18 DIAGNOSIS — J84112 Idiopathic pulmonary fibrosis: Secondary | ICD-10-CM | POA: Diagnosis not present

## 2020-11-18 MED ORDER — HYDROCODONE-HOMATROPINE 5-1.5 MG/5ML PO SYRP: 5.0000 mL | ORAL_SOLUTION | Freq: Three times a day (TID) | ORAL | 0 refills | Status: DC | PRN

## 2020-11-19 ENCOUNTER — Telehealth: Payer: Self-pay | Admitting: Internal Medicine

## 2020-11-19 NOTE — Telephone Encounter (Signed)
MR please advise on an alternative for the cough syrup since this was on back order.  thanks

## 2020-11-19 NOTE — Telephone Encounter (Signed)
Checked the database for the guaifenesin 347m/codeine 168msolution. The highest solution I could find was guaifenesin 22562modeine 7.5mg26mlution. I have pended this order.

## 2020-11-19 NOTE — Telephone Encounter (Signed)
[facility] have called [facility] and the pharmacy stated that they do have the codeine with guaifenesin in stock that they can fill for the pt.    I have called the pt and he is fine with this cough med being sent to the pharmacy.  MR please advise. Thanks

## 2020-11-19 NOTE — Telephone Encounter (Signed)
pls call pharmacy and check if they have  tussionex 18m bid or codeine cough syrup or any other opioid based cough syrup and cost.   So availablitly and cost  Then, let patient know and then we can do the script

## 2020-11-19 NOTE — Telephone Encounter (Signed)
Guaifenesin 300 mg and codeine 10 mg per 5 mL: 5 mL BID  hours as needed; maximum daily dose: 40 mL/day   Ok my older cellphone has the 2nd factor authentication - and I can bring that in 11/22/20 and sign off then. Meanwile plesae do it and send it back to me

## 2020-11-23 MED ORDER — GUAIFENESIN-CODEINE 225-7.5 MG/5ML PO LIQD: 5.0000 mL | Freq: Two times a day (BID) | ORAL | 0 refills | Status: DC | PRN

## 2020-11-23 NOTE — Telephone Encounter (Signed)
That is fine 166m-5mL dosing

## 2020-11-23 NOTE — Telephone Encounter (Signed)
Done. Please verify and close

## 2020-11-23 NOTE — Telephone Encounter (Signed)
Looks like the med was sent to pharm but does not show that they confirmed yet. ATC [facility] and they are currently closed. Will call back later to ensure rx was received.

## 2020-11-23 NOTE — Telephone Encounter (Signed)
Dr. Chase Caller please advise on message from [facility] and if you are ok with what they have. If you are please send order to pharmacy for patient.   The med that was given as another option is also on back order. They do have 13m-5ml is this ok? Please advise.

## 2020-11-23 NOTE — Telephone Encounter (Signed)
Dr. Chase Caller I believe that you have to send in this prescription since it is controlled and I am not able to call it into the pharmacy.  Thank you

## 2020-11-24 DIAGNOSIS — G4733 Obstructive sleep apnea (adult) (pediatric): Secondary | ICD-10-CM | POA: Diagnosis not present

## 2020-11-24 DIAGNOSIS — J84112 Idiopathic pulmonary fibrosis: Secondary | ICD-10-CM | POA: Diagnosis not present

## 2020-11-24 NOTE — Telephone Encounter (Signed)
Can you pend it pleas and I can sign it electronically. Magda Paganini and [place] have done it

## 2020-11-24 NOTE — Telephone Encounter (Signed)
rx has been pended. Routing to MR.

## 2020-11-25 MED ORDER — GUAIFENESIN-CODEINE 100-10 MG/5ML PO SOLN: 5.0000 mL | Freq: Two times a day (BID) | ORAL | 0 refills | Status: DC | PRN

## 2020-11-25 NOTE — Telephone Encounter (Signed)
Called and spoke with pt letting him know that MR had refilled the guaifenesin cough syrup for him and he verbalized understanding. Nothing further needed.

## 2020-11-25 NOTE — Telephone Encounter (Signed)
done

## 2020-11-28 DIAGNOSIS — I10 Essential (primary) hypertension: Secondary | ICD-10-CM | POA: Diagnosis not present

## 2020-11-28 DIAGNOSIS — E785 Hyperlipidemia, unspecified: Secondary | ICD-10-CM | POA: Diagnosis not present

## 2020-12-13 ENCOUNTER — Telehealth: Payer: Self-pay | Admitting: Internal Medicine

## 2020-12-13 IMAGING — CT CT CHEST HIGH RESOLUTION W/O CM
2 of 7 series · 14 of 36 positions shown, 17 images · non-contrast
Comparison: CT chest, 10/30/2019, 06/27/2019, 12/31/2018,
01/21/2018, CT abdomen pelvis, 02/01/2014

CLINICAL DATA: Interstitial lung disease

EXAM:
CT CHEST WITHOUT CONTRAST
TECHNIQUE: Multidetector CT imaging of the chest was performed following the
standard protocol without intravenous contrast. High resolution
imaging of the lungs, as well as inspiratory and expiratory imaging,
was performed.

[Series 4: high resolution · axial · 0.72mm/px · z∈[-322,-82]mm · 11 of 287 slices shown, 14 images]
[im 24/287  mediastinal]
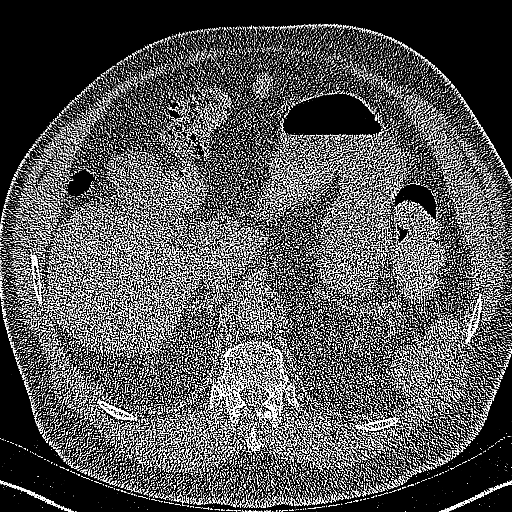
[im 24/287  lung]
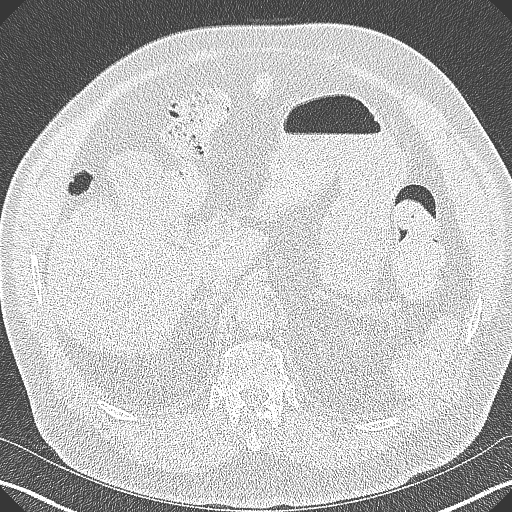
[im 48/287  lung]
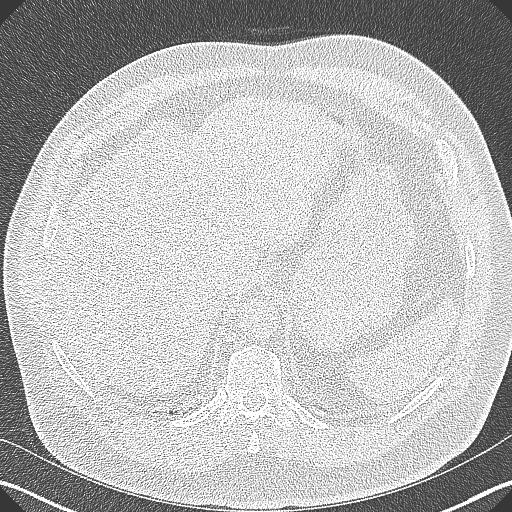
[im 72/287  lung]
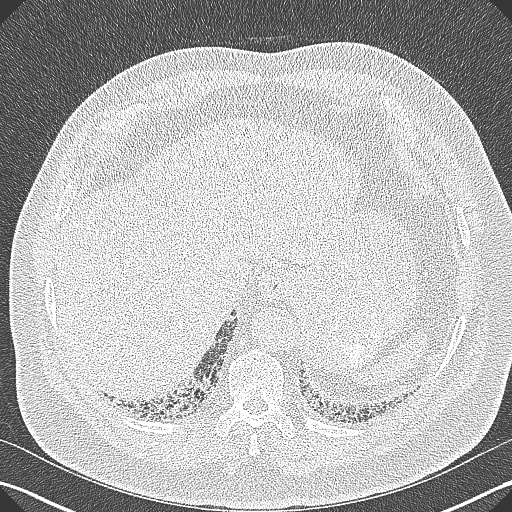
[im 96/287  lung]
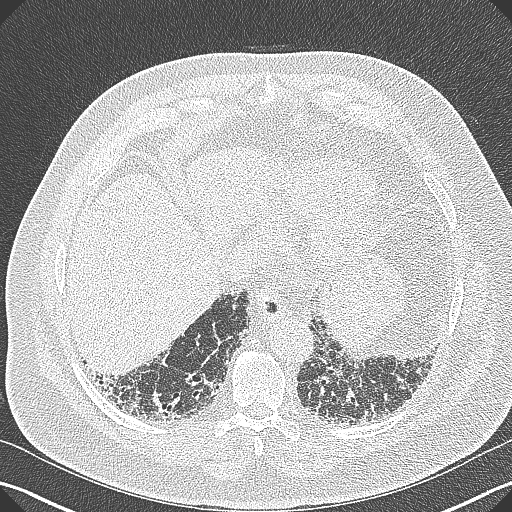
[im 120/287  mediastinal]
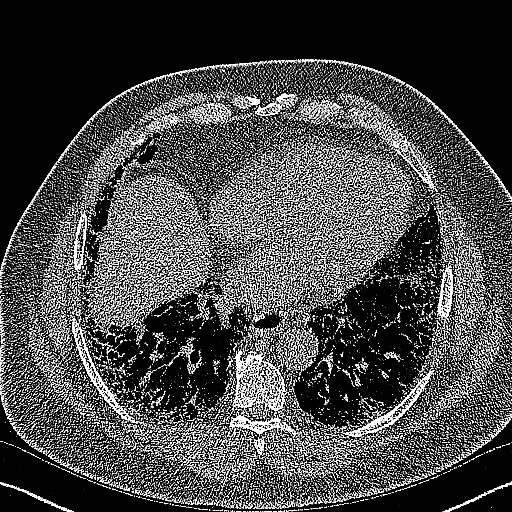
[im 120/287  lung]
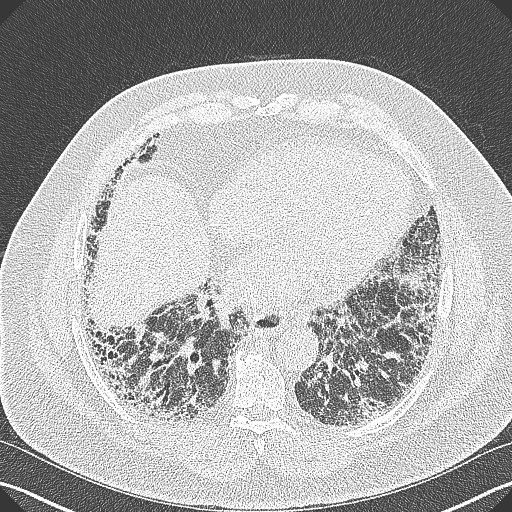
[im 144/287  lung]
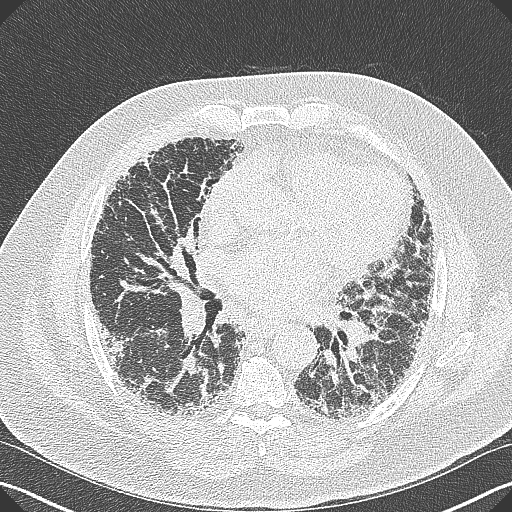
[im 167/287  lung]
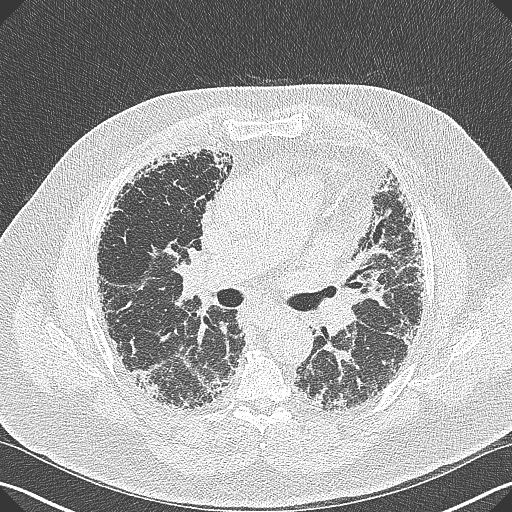
[im 191/287  lung]
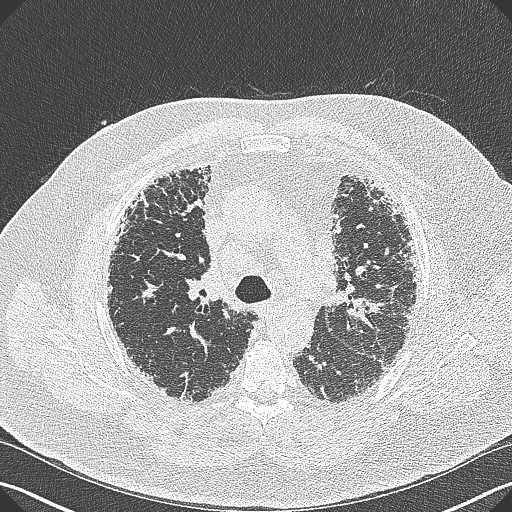
[im 215/287  mediastinal]
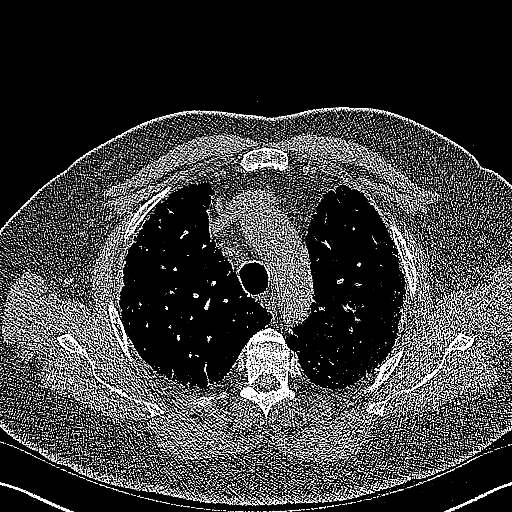
[im 215/287  lung]
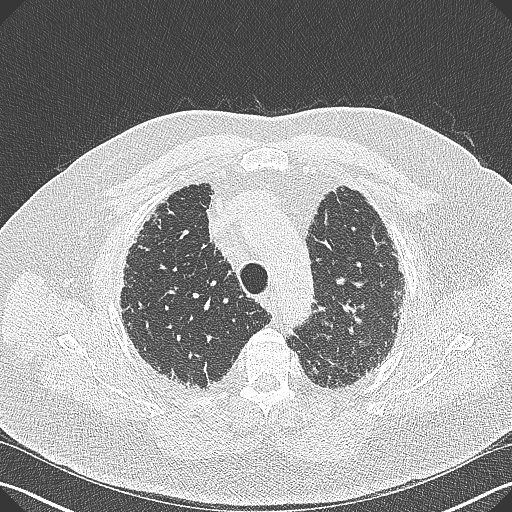
[im 239/287  lung]
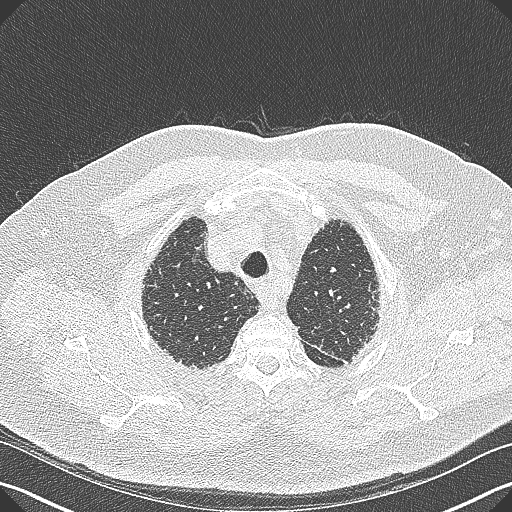
[im 263/287  lung]
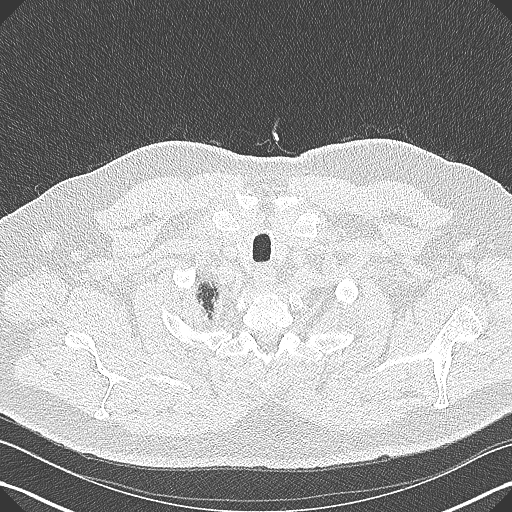

[Series 6: coronal · coronal · 0.59mm/px · 3 of 145 slices shown]
[im 29/145  lung]
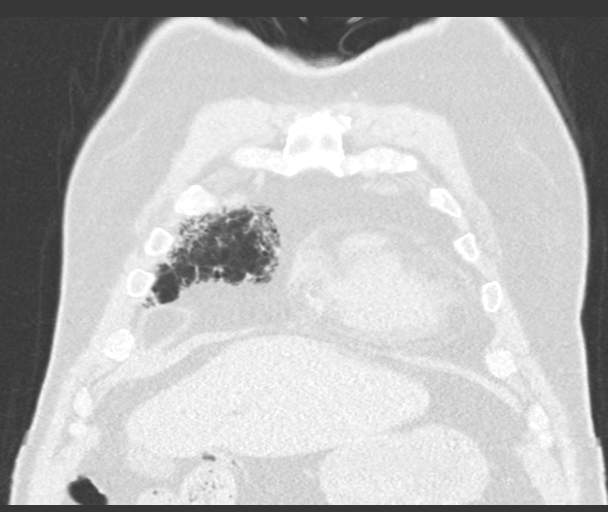
[im 58/145  lung]
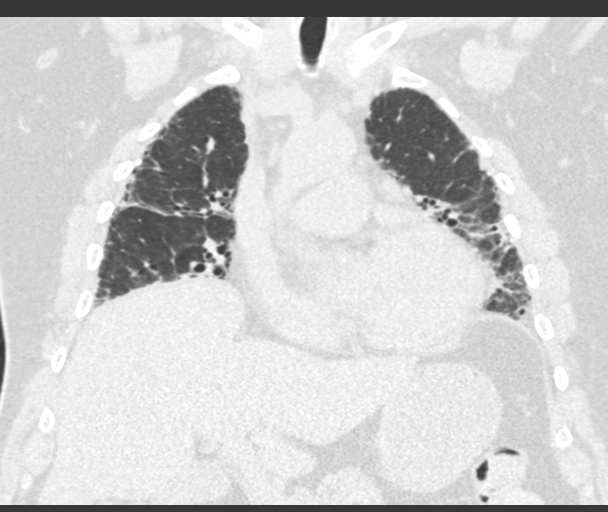
[im 87/145  lung]
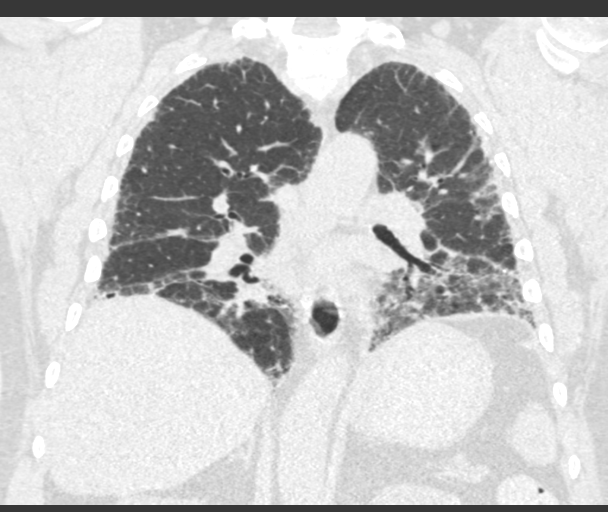

[14 of 36 positions shown; findings below may reference images not displayed]

FINDINGS: Cardiovascular: Scattered aortic atherosclerosis. Mild cardiomegaly.
Three-vessel coronary artery calcifications. No pericardial
effusion.

Mediastinum/Nodes: Unchanged prominent mediastinal and hilar lymph
nodes. Thyroid gland, trachea, and esophagus demonstrate no
significant findings.

Lungs/Pleura: Redemonstrated pattern of moderate pulmonary fibrosis
in a pattern with apical to basal gradient featuring irregular
peripheral interstitial opacity, traction bronchiectasis,
bronchiolectasis, and areas of honeycombing at the lung bases.
Fibrotic findings are not significantly changed in comparison to
examination dated 10/30/2019, but are clearly worsened over time on
multiple prior examinations dating back to 02/01/2014. No
significant air trapping on expiratory phase imaging. No pleural
effusion or pneumothorax.

Upper Abdomen: No acute abnormality.

Musculoskeletal: No chest wall mass or suspicious bone lesions
identified.
IMPRESSION: 1. Redemonstrated pattern of moderate pulmonary fibrosis in a
pattern with apical to basal gradient featuring irregular peripheral
interstitial opacity, traction bronchiectasis, bronchiolectasis, and
areas of honeycombing at the lung bases. Fibrotic findings are not
significantly changed in comparison to examination dated 10/30/2019,
but are clearly worsened over time on multiple prior examinations
dating back to 02/01/2014. Findings are consistent with an UIP
pattern by ATS pulmonary fibrosis criteria and in keeping with
clinical diagnosis of IPF.
2. Cardiomegaly and coronary artery disease.
3. Aortic Atherosclerosis ([id number]).

## 2020-12-15 NOTE — Telephone Encounter (Signed)
LMTCB

## 2020-12-15 NOTE — Telephone Encounter (Signed)
Spoke with patient's wife. She stated that the patient has received letters stating that the insurance has denied the POC coverage starting from April of last year. She stated that the letters were sent from [place]. She has not followed up with the insurance company.   I advised her that I would send a message over to [employer] to see if they could provide [place] with any information so we can get this corrected. She verbalized understanding.   Community message has been sent to [employer].

## 2020-12-15 NOTE — Telephone Encounter (Signed)
Stephen [name] wife is returning phone call. Stephen [name] phone number is [phone].

## 2020-12-17 ENCOUNTER — Other Ambulatory Visit: Payer: Self-pay | Admitting: Internal Medicine

## 2020-12-17 NOTE — Telephone Encounter (Signed)
[facility] is requesting a refill of the guaifenesin-codeine sent to them for refill.    Last filled on 11/25/2020 120ML With no refills.    MR please advise if you will send this to the pharmacy for the pt.  Thanks

## 2020-12-19 DIAGNOSIS — J84112 Idiopathic pulmonary fibrosis: Secondary | ICD-10-CM | POA: Diagnosis not present

## 2020-12-20 NOTE — Telephone Encounter (Signed)
LM with [employer] to check status of POC/oxygen machine.

## 2020-12-21 NOTE — Telephone Encounter (Signed)
Community message sent to Skeet Latch with [employer].

## 2020-12-22 NOTE — Telephone Encounter (Signed)
Yes please do and send for FP signature to me. He needs it monthly scheduled.

## 2020-12-22 NOTE — Telephone Encounter (Signed)
Noted. Medication pended and will send back to MR to send to pharmacy.

## 2020-12-24 NOTE — Telephone Encounter (Signed)
Stephen Deaner, RN; Stephen [name]; Stenson, Melissa   No ma'am, sorry I do not. I would have the patient call and speak with our billing department or call her insurance.   Thanks      ------------------------------------  New message sent to [facility] to see what their billing number is for pt to call.

## 2020-12-25 DIAGNOSIS — J84112 Idiopathic pulmonary fibrosis: Secondary | ICD-10-CM | POA: Diagnosis not present

## 2020-12-25 DIAGNOSIS — G4733 Obstructive sleep apnea (adult) (pediatric): Secondary | ICD-10-CM | POA: Diagnosis not present

## 2020-12-27 DIAGNOSIS — E785 Hyperlipidemia, unspecified: Secondary | ICD-10-CM | POA: Diagnosis not present

## 2020-12-27 DIAGNOSIS — I10 Essential (primary) hypertension: Secondary | ICD-10-CM | POA: Diagnosis not present

## 2020-12-29 NOTE — Telephone Encounter (Signed)
LMTCB

## 2020-12-29 NOTE — Telephone Encounter (Signed)
Confirmed with pharmacy medication script was received.   Nothing further needed at this time.

## 2020-12-30 NOTE — Telephone Encounter (Signed)
Herbert Deaner, RN; Sandi Raveling, Melissa  If she calls this number below she should be directed to the billing department   [phone]   [facility]!  -------------------------------------------------------   Called and spoke to pt's wife. Informed her of the billing dept number with [employer]. Will keep encounter open to follow back up with pt next week.

## 2021-01-11 NOTE — Telephone Encounter (Signed)
Called and spoke to pt's wife, Delores. She states she followed up with [employer] but they informed her that they have not received any notice of a denial or authorization. Called [employer] and the number below and spoke with [facility]. She states [employer] has all the required documents needed for pt's POC and it was forwarded to insurance. Malachy Mood states she will forward our inquiry to a different dept to look into this a bit more just in case there is something missing. Will wait for their call.

## 2021-01-12 NOTE — Telephone Encounter (Signed)
I called and spoke with the pt's spouse, Delores and notified of the below per [place]. She has not heard any word on POC as of today. I advised to let us know if they do not hear back soon.

## 2021-01-19 ENCOUNTER — Telehealth: Payer: Self-pay | Admitting: Internal Medicine

## 2021-01-19 NOTE — Telephone Encounter (Signed)
Called and spoke with patient's wife. She states she received a letter stating he was denied. Wife states he has Oxygo next, concentrator and back up tanks at home. States she has been receiving this letter monthly. Called [employer] and spoke with Abigail Butts to see if she could tell us what he was denied for. They did not have anything denied on their end. Abigail Butts is sending an email to the authorization team for more information and they will reach out to patient's wife. Nothing further needed.

## 2021-01-22 DIAGNOSIS — J84112 Idiopathic pulmonary fibrosis: Secondary | ICD-10-CM | POA: Diagnosis not present

## 2021-01-22 DIAGNOSIS — G4733 Obstructive sleep apnea (adult) (pediatric): Secondary | ICD-10-CM | POA: Diagnosis not present

## 2021-02-15 DIAGNOSIS — G4733 Obstructive sleep apnea (adult) (pediatric): Secondary | ICD-10-CM | POA: Diagnosis not present

## 2021-02-17 ENCOUNTER — Ambulatory Visit: Payer: PPO | Admitting: Internal Medicine

## 2021-02-17 ENCOUNTER — Other Ambulatory Visit: Payer: Self-pay

## 2021-02-17 ENCOUNTER — Encounter: Payer: Self-pay | Admitting: Internal Medicine

## 2021-02-17 VITALS — BP 126/78 | HR 75 | Temp 98.0°F | Ht 66.0 in | Wt 236.4 lb

## 2021-02-17 DIAGNOSIS — J84112 Idiopathic pulmonary fibrosis: Secondary | ICD-10-CM

## 2021-02-17 DIAGNOSIS — J9611 Chronic respiratory failure with hypoxia: Secondary | ICD-10-CM | POA: Diagnosis not present

## 2021-02-17 DIAGNOSIS — R053 Chronic cough: Secondary | ICD-10-CM

## 2021-02-17 DIAGNOSIS — Z5181 Encounter for therapeutic drug level monitoring: Secondary | ICD-10-CM

## 2021-02-17 DIAGNOSIS — Z8616 Personal history of COVID-19: Secondary | ICD-10-CM

## 2021-02-17 MED ORDER — SODIUM CHLORIDE 3 % IN NEBU: INHALATION_SOLUTION | RESPIRATORY_TRACT | 5 refills | Status: DC

## 2021-02-17 MED ORDER — HYDROCODONE-HOMATROPINE 5-1.5 MG/5ML PO SYRP: 5.0000 mL | ORAL_SOLUTION | Freq: Three times a day (TID) | ORAL | 0 refills | Status: DC | PRN

## 2021-02-17 NOTE — Progress Notes (Signed)
IOV 03/05/2018  Chief Complaint  Patient presents with  . Consult    Referral per MD Helene Kelp, Dec 2018 had sinus infection and still has cough, hx of mild bronchiectasis, later was given symbicort which patient believes is making him more SOB.      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  He lives in [facility] and presents with his wife for evaluation of pulmonary fibrosis to the ILD clinic.  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  During this time he was not exposed to any metal dust but he was exposed to mold and damp environments intermittently but not always.  He denies any mold or mildew in the house.  He tells me that in December 2018 he had a sinus infection and since then has a lingering cough that never really went away.  It looks like from the primary care physician he saw dentist and from there he had imaging and this finally all resulted in a high-resolution CT chest January 21, 2018 that I personally visualized.  The official report is that it is indeterminate for UIP.  In my personal visualization there is bilateral bibasal symmetric disease with definite of craniocaudal gradient.  The septal thickening and also cylindrical bronchiectasis and peripheral bronchiec bronchiolectasis.  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  He does not have much of her shortness of breath but then he is morbidly obese and has balance issues and is quite limited.  He was seen by a local pulmonologist will give him a 2-5-year survival rate and therefore he is frustrated.  He was started on Symbicort which actually made things worse for him.  [place] pulmonary interstitial lung disease questionnaire  Symptoms: Since dec 2019. Some sputjm +. Better since started. Insidious dyspnea x 4 months. Same since onset. No episodic  dyspnea. lLeve 4 dyspnea walking wiuth peers and level 5 walking up hill/stairs. Level 1 for dressing, Level 2 fo rshopping and mpwing lawn  ROS: chronic balance issues affecting mobiluity.  In association with this he has fatigue for the last 3 years.  He has dry mouth for the last several years associated with some dysphagia for the last 10 years heartburn and acid reflux for several years.  Daytime set sleepiness for the last several years but denies any oral ulcers or rash or weight loss or recurrent fever or arthralgia  Past medical history: Denies any asthma COPD or heart failure rheumatoid arthritis or scleroderma or systemic lupus erythematosus or polymyositis or Sjogren's but is positive for sleep apnea and hiatal hernia.  Negative for pulmonary hypertension diabetes thyroid disease stroke seizures hepatitis tuberculosis kidney disease blood clots heart disease other than murmur pleurisy  Family history of pulmonary disease: Positive for asthma in the mother but nobody with pulmonary fibrosis  Personal exposure history: He has never smoked cigarettes did smoke cigars for 3 years.  [place]  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  During this time there is intermittent mold  exposure  Pulmonary toxicity history denies a laundry list of pulmonary toxicity medications    OV 04/11/2018  Chief Complaint  Patient presents with  . Follow-up    PFT performed today.  Pt is still coughing with occ. white mucus and pt also states he believes SOB is worse and is becoming SOB more easily.    Follow-up interstitial lung disease work-up  Symptoms: In the interim no new symptoms.  He does admit to having  chronic arthralgia for many decades after sheetrock fell on him.  He also has joint stiffness especially in the hands but he does not know if it is early morning stiffness.  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 Lorin Picket.  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  His autoimmune and vascular does panel shows trace positivity for ANCA and cyclic citrulline peptide.    OV 06/04/2018   Chief Complaint  Patient presents with  . Follow-up    Pt states things have been about the same since last visit.   FU ILD - ccp and atypical p-anca trace positive May 2019. HP panel negative.  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 Lung function: Severity -FVC 63% and DLCO 58% showing moderate ILD. Rheum evaluation - 04/25/18 - no evidence of RA (Dr Trudie Reed)  Here with his wife to discuss test results. In the interim he saw Dr. Gavin Pound on 04/25/2018. She is of rheumatology. Clinically she did not find any evidence of systemic rheumatoid arthritis or vasculitis. Therefore he is here for decision making regarding his ILD. In the interim there are no new issues.  OV 07/16/2018  Subjective:  Patient ID: Stephen [name], male , DOB: 1951-09-04 , age 60 y.o. , MRN: [id number] , ADDRESS: [address] [place] [address]   07/16/2018 -   Chief Complaint  Patient presents with  . Follow-up    Pt started OFEV 8/11 and so far denies any complaints.    FU ILD - ccp and atypical p-anca trace positive May 2019. HP panel negative.  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 Lung function: Severity -FVC 63% and DLCO 58% showing moderate ILD. Rheum evaluation - 04/25/18 - no evidence of RA (Dr Trudie Reed). Dx of IPF given 06/04/18. STarted ofev 8/11/9    HPI DQUAN CORTOPASSI 70 y.o. - presents for follow-up of his idiopathic pulmonary fibrosis. Diagnoses given 06/04/2018. He is started on  Ofev 06/09/2018. He is here with his wife. He says he is tolerating the Ofev just fine. No GI issues. He gets support from the Child psychotherapist Clarene Critchley. He will have a liver function test today. He deferred flu shot because he will have it with his primary care physician. He told me that he is planning to join the pulmonary fibrosis foundation patient support group. Next meeting is 07/25/2018 Thursday 6 PM at [facility]. He is also willing to join research trials.He has mobility issues on account of his obesity and cane use. He says he cannot do a treadmill or walk long distances.   He has chronic tinnitus and apparently this is from working in [employer] base during [place] War although he was never deployed overseas. He started getting VA eligibility. He asked [place] to fill forms. ROS - per HPI  OV 09/03/2018  Subjective:  Patient ID:  Stephen [name], male , DOB: 1951-07-08 , age 11 y.o. , MRN: [id number] , ADDRESS: [address] [place] [address]   09/03/2018 -   Chief Complaint  Patient presents with  . Follow-up    Doing well at this time coughing up white mucus     FU ILD - ccp and atypical p-anca trace positive May 2019. HP panel negative.  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 Lung function: Severity -FVC 63% and DLCO 58% showing moderate ILD. Rheum evaluation - 04/25/18 - no evidence of RA (Dr Trudie Reed). Dx of IPF given 06/04/18. STarted ofev 8/11/9   HPI ILYA NEELY 70 y.o. -returns for follow-up of his IPF.  He has been on nintedanib since mid August 2019.  He is here with his wife.  So far is tolerating the nintedanib just fine.  He says that his baseline constipation still continues and he needs both MiraLAX and Citrucel.  However what he notices that when he has relief of constipation it is diarrhea.  He says he is not able to come down on the laxative dose because otherwise the constipation returns.  He feels the 05 is not causing any diarrhea.  Is no  abdominal pain nausea vomiting.  In terms of shortness of breath is stable.  He tried to have pulmonary function test as follow-up like a few weeks ago but he started coughing and did not produce enough spirometry's.  All we have the symptoms which is stable.  He is up-to-date with his flu shot.  He is interested in research protocols.  He uses a cane normally.      OV 12/17/2018  Subjective:  Patient ID: Stephen [name], male , DOB: 02/15/1951 , age 47 y.o. , MRN: [id number] , ADDRESS: [address] [place] [address]   12/17/2018 -   Chief Complaint  Patient presents with  . Follow-up    PFT performed today but pt was unable to perform DLCO due to coughing. Pt states SOB is about the same. Denies any CP or chest tightness.    FU ILD - ccp and atypical p-anca- ALL trace positive May 2019. HP panel negative.  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 Lung function: Severity -FVC 63% and DLCO 58% showing moderate ILD. Rheum evaluation - 04/25/18 - no evidence of RA (Dr Trudie Reed). Dx of IPF given 06/04/18. STarted ofev 8/11/9   HPI GRACESON NICHELSON 70 y.o. -IPF follow-up.  He presents with his wife as usual.  He tells me that starting January 2020 nintedanib started causing significant diarrhea.  By the third week of January 2020 he reduced himself to 1 tablet once daily at 150 mg.  With this there is no diarrhea.  He says that at the full dose he would take Imodium for diarrhea and then he would get constipated for several days.  He would follow this with a laxative and then he would have explosive diarrhea.  He said the symptoms became unmanageable.  In terms of his IPF itself he is stable.  His other main issues worsening chronic cough.  He has had chronic cough for b for several years predating IPF diagnosis.  He has been on ACE inhibitor's but it appears that lisinopril got rotated to benazepril and then to losartan but the cough never improved.  So he got switch back to benazepril.  He says that his  primary care team is aware that all these agents can  cause cough but the emphasis was on controlling the blood pressure.  He says the cough is severe.  Mostly in the daytime very rarely at night.  There is a laryngeal hoarse quality to his cough.  Noticed results on fish oil and carvedilol.  We discussed about neuropathic treatment for cough but he is already on amitriptyline ROS - per HPI   OV 06/30/2019  Subjective:  Patient ID: Stephen [name], male , DOB: Dec 02, 1950 , age 12 y.o. , MRN: [id number] , ADDRESS: [address] [place] [address]   06/30/2019 -   Chief Complaint  Patient presents with  . IPF (idiopathic pulmonary fibrosis)    Does not feel the breathing is any different than last visit. Still using Ofev, but still having diarrhea with it.     FU ILD - ccp and atypical p-anca- ALL trace positive May 2019. HP panel negative.  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 Lung function: Severity -FVC 63% and DLCO 58% showing moderate ILD. Rheum evaluation - 04/25/18 - no evidence of RA (Dr Trudie Reed). Dx of IPF given 06/04/18. STarted ofev 06/09/18  HPI FINCH COSTANZO 70 y.o. -presents for follow-up with his wife.  He has a diagnosis of IPF.  He has been on nintedanib since August 2019.  He preferred to take nintedanib initially because of his preference to have diarrhea.  At this point in time he tells me that he is having progressive dyspnea.  Early in the spring 2020 he noticed that he could not walk back from his office to the front door.  He could do this in 2019.  He also has had difficulty mowing the lawn with the more.  In addition is having significant diarrhea from the nintedanib.  It is severe despite agents.  He is willing to change the drug.  In between he saw the nurse practitioner he had a high-resolution CT chest in August 2020.  He has mildly progressive disease even compared to March 2020.  Walking desaturation test and symptom scores show evidence of progression and documented  below.       OV 08/27/2019  Subjective:  Patient ID: Stephen [name], male , DOB: February 20, 1951 , age 28 y.o. , MRN: [id number] , ADDRESS: [address] [place] [address]  FU IPF  - ccp and atypical p-anca- ALL trace positive May 2019. HP panel negative.  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 Lung function: Severity -FVC 63% and DLCO 58% showing moderate ILD. Rheum evaluation - 04/25/18 - no evidence of RA (Dr Trudie Reed). Dx of IPF given 06/04/18. STarted ofev 8/11/9. STopped Aug 2020 due to progression and diarrhea. STrted Esbriet -July 25, 2019   08/27/2019 -   Chief Complaint  Patient presents with  . Follow-up    Patient reports that his breathing is about the same. He has sob with exertion and productive cough.      HPI JHONNIE ALIANO 70 y.o. -presents with his wife for follow-up.  He tells me that Jacklynn Bue was started on July 25, 2019.  He took 3 weeks for it to come.  During this time he was off nintedanib and his diarrhea resolved. Wife tells me that he has been completely sedentary because of shortness of breath.  He feels the shortness of breath is getting worse.  He is also gained weight because of his sedentary lifestyle.  He is already morbidly obese.  He has pedal edema for which she  wears stockings.  He is not using oxygen at night because he is never needed it.  He is very concerned about the shortness of breath getting worse.  In fact it is worse even since his last visit in August 2020.  This is documented in the questionnaire below.  Also seen in the pulmonary function test.  His CT scan of the chest August 2020 shows worsening within 5 months.  I reviewed this with him.  He tells me that even taking a shower makes him desaturate although today he did not desaturate here in with a forehead probe to 88%.  However he did not do a 6-minute walk test.  He feels he would benefit from oxygen   IMPRESSION: HRCT Augu 2020 compared to March 2020 1. Spectrum of findings  compatible with basilar predominant fibrotic interstitial lung disease with probable early honeycombing at the right costophrenic angle. Slight interval progression. Findings are consistent with UIP per consensus guidelines: Diagnosis of Idiopathic Pulmonary Fibrosis: An Official ATS/ERS/JRS/ALAT Clinical Practice Guideline. [place], Iss 5, [phone], Jun 30 2017. 2. Mild mediastinal lymphadenopathy is stable, compatible with benign reactive adenopathy. 3. Three-vessel coronary atherosclerosis.  Aortic Atherosclerosis (ICD10-I70.0).   Electronically Signed   By: Ilona Sorrel M.D.   On: 06/28/2019 16:35  ROS - per HPI   OV 09/29/2019  Subjective:  Patient ID: Stephen [name], male , DOB: [birth year] , age 70 y.o. , MRN: [id number] , ADDRESS: [address] [place] [address]   FU IPF  -trace positive ccp and atypical p-anca- ALL trace positive May 2019 (repeat CCP - October 2020]. HP panel negative.  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 Lung function: Severity -FVC 63% and DLCO 58% showing moderate ILD. Rheum evaluation - 04/25/18 - no evidence of RA (Dr Trudie Reed). Dx of IPF given 06/04/18. STarted ofev 8/11/9. STopped Aug 2020 due to progression and diarrhea. STrted Esbriet -July 25, 2019   Associated sleep apnea present   Last CT 06/27/2019 Low risk cardiac stress test April 2019.   09/29/2019 -   Chief Complaint  Patient presents with  . Follow-up    Pt states his breathing has become worse since last visit and also has a worsening cough and is coughing up dark green phlegm which he has had for awhile now. Pt is on 2L O2 24/7.      HPI HICKS FEICK 70 y.o. -he is now attending pulmonary rehabilitation.  On September 16, 2019 he did have a 6-minute walk test.  He did desaturate to 80% from 96%.  He dropped his pulse ox to 86% at the second minut.  He did correct with 2 L of oxygen.  E also in the interim he did have overnight oxygen study  and we have recommended 2 L nasal cannula oxygen with the CPAP.  He is now wearing that.  Of note, during the overnight oxygen study his heart rate was 48-50.  Today he also had a 6-minute walk test at our office on 2 L oxygen and with this his pulse ox did not drop.  Overall he is reporting stability.  This can be demonstrated by the symptom questionnaire below.  He does have significant amount of shortness of breath despite wearing his 2 L.  Even though the 2 L helps his dyspnea he still says the dyspnea is significant.  He still has chronic significant pedal edema.  He continues to be morbidly obese.  We discussed his diet.  His wife states that he eats all the time and cannot stop eating.  Apparently he goes through a carton of ice cream every few days.  He says that he will not be able to control his dietary indiscretion.  He is not interested in lung transplant.  He is enjoying pulmonary rehabilitation.  Given his diastolic dysfunction I did refer him to his cardiologist but he says he does not have an appointment.    02/13/2020 Follow up : ILD  Patient presents for a 100-monthfollow-up.  Patient says overall breathing is doing about the same.  He has had no flare of his cough or shortness of breath.  Says his activity level is about at baseline.  Says he would like to be more active has been try to do some yard work however is very hard for him to carry his oxygen tanks.  Would like a portable oxygen concentrator.  Walk test in the office shows desaturation below 88% on room air.  Requires 2 L of oxygen to keep O2 saturations greater than 88%.  Patient gets short of breath with heavy activity.  Has to stop and rest several times.  He remains on Esbriet.  Denies any nausea vomiting diarrhea.    OV 05/19/2020   Subjective:  Patient ID: RLevin [name] male , DOB: 609-Oct-1952 age 70y.o. years. , MRN: [id number]  ADDRESS: [address] [place] [address]PC[address]  HRonita Hipps MD Providers : Treatment Team:   Attending Provider: RBrand Males MD  FU IPF  -trace positive ccp and atypical p-anca- ALL trace positive May 2019 (repeat CCP - October 2020]. HP panel negative.  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 Lung function: Severity -FVC 63% and DLCO 58% showing moderate ILD. Rheum evaluation - 04/25/18 - no evidence of RA (Dr HTrudie Reed. Dx of IPF given 06/04/18. STarted ofev 8/11/9. STopped Aug 2020 due to progression and diarrhea. STrted Esbriet -July 25, 2019.   Chief Complaint  Patient presents with  . Follow-up    Productive cough with brownish-yellow phlegm       HPI RJASIYAH PAULDING89y.o. -presents for follow-up.  Last seen in #2020 as standard of care.  After that he was on the [place] which prematurely ended.  He continues in his bed without any problems.  He has not lost any weight.  However he is having progressive shortness of breath and progressive cough.  Particularly worse in the last 1 month.  In the last few days the cough is even more worse.  Last night he was coughing all night.  He is not able to talk without coughing.  When he takes a shower with 2 L nasal cannula he desaturates to 84%.  When he walks in the mall with 2 L nasal cannula he desaturates to 84%.  He has been asked to uptitrate his oxygen to 4 L with exertion but is not done that.  He is also got some brown-yellow sputum for the last 1 month.  There is no wheezing.  Hycodan cough syrup is not helping.  He is frustrated by all this.  And so is his wife.  There is no edema chest pain.  He easily desaturates.    06/09/2020- Interim hx Patient contacted today for virtual telephone visit/2 to 3-week follow-up ILD. Cough may be a litle bit better but is still productive with yellow/brown mucus. Cough is not new. Hydromet medication does  help. He did not notice any difference after completing doxycycline/prednisone course. No increase in oxygen demand. Denies fever, chest tightness, wheezing.      OV  06/15/2020   Subjective:  Patient ID: Stephen [name], male , DOB: 1951/03/07, age 47 y.o. years. , MRN: [id number],  ADDRESS: [address] [place] [address] PCP  Ronita Hipps, MD Providers : Treatment Team:  Attending Provider: Brand Males, MD    FU IPF  -trace positive ccp and atypical p-anca- ALL trace positive May 2019 (repeat CCP - October 2020]. HP panel negative.  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 Lung function: Severity -FVC 63% and DLCO 58% showing moderate ILD. Rheum evaluation - 04/25/18 - no evidence of RA (Dr Trudie Reed). Dx of IPF given 06/04/18. STarted ofev 8/11/9. STopped Aug 2020 due to progression and diarrhea. STrted Esbriet -July 25, 2019 Gabapentin for cough July  2021  DNR since aug 2021    Chief Complaint  Patient presents with  . Follow-up    Pt states that he is about the same since last visit. States still has problems with SOB.       HPI MIKLOS BIDINGER 70 y.o. -presents for follow-up of his IPF.  He is tolerating pirfenidone after last visit in July 2021.  At that visit cough is the dominant feature.  We started him on gabapentin once daily at night.  He has tolerated it well.  He says the cough is improved somewhat but not significantly with it.  It is definitely helping him though.  Doxycycline and prednisone did not help him.  In fact the prednisone burst made him a little bit restless.  He is now requiring 3 L of oxygen at rest.  Previously it was 2 L.  His last CT scan of the chest was December 2020.  In the interim he saw a Designer, jewellery.  He is confirmed his DNR status.  He is really frustrated by the cough.  He feels that his sputum inside but unable to cough.  He is gagging and is trying to clear his throat a lot.  In addition his wife tells me last week for a few days he had bilateral leg weakness and had increased imbalance issues.  However it was not a focal neurologic deficit.  There is no bladder or bowel problems.  It has since  resolved.  There is no speech issues.  There is no back pain.  They are to talk to the primary care physician about this.      OV 08/24/2020  Subjective:  Patient ID: Stephen [name], male , DOB: [birth year] , age 39 y.o. , MRN: [id number] , ADDRESS: [address] [place] [address] PCP Ronita Hipps, MD Patient Care Team: Ronita Hipps, MD as PCP - General (Family Medicine)  This Provider for this visit: Treatment Team:  Attending Provider: Brand Males, MD  Type of visit: Telephone/Video Circumstance: COVID-19 national emergency Identification of patient GERY SABEDRA with 1950/11/20 and MRN [id number] - 2 person identifier Risks: Risks, benefits, limitations of telephone visit explained. Patient understood and verbalized agreement to proceed Anyone else on call: yes - wife Patient location:his home This provider location: home due to extenutating circumstance   08/24/2020 - FU IPF   FU IPF  -trace positive ccp and atypical p-anca- ALL trace positive May 2019 (repeat CCP - October 2020]. HP panel negative.  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 Lung  function: Severity -FVC 63% and DLCO 58% showing moderate ILD. Rheum evaluation - 04/25/18 - no evidence of RA (Dr Trudie Reed). Dx of IPF given 06/04/18. STarted ofev 8/11/9. STopped Aug 2020 due to progression and diarrhea. STrted Esbriet -July 25, 2019 .Screen Failed Pliant study due to severe cough - Sept 2021  Severe cough   - Gabapentin for cough July  2021  - 3% saline - since Aug 2021  - hycodan qhs  - Unable to do PFT  DNR but full medical care since aug 2021  On 3LNC   HPI Stephen [name] 26 y.o. - telephone visit. Joined by wife. Says dyspnea stable on 3L [place] continuous and baseline. Says cough improved with rx of gabapentin, 3% saline and hycodan qhs. Wants refill on latter. Did not qualify for pliant study due to cough interfereing with PFT quality. Tolerating esbriet well. No new issues        PFT Results  Latest Ref Rng & Units 08/27/2019 10/03/2018 08/16/2018 04/11/2018  FVC-Pre L 2.18 2.42 2.53 2.57  FVC-Predicted Pre % 56 65 67 63  Pre FEV1/FVC % % 82 91 89 89  FEV1-Pre L 1.78 2.22 2.26 2.29  FEV1-Predicted Pre % 62 80 81 76  DLCO uncorrected ml/min/mmHg 13.67 - - 16.68  DLCO UNC% % 59 - - 58  DLVA Predicted % 97 - - 106    ROS - per HPI  IMPRESSION: HRCT Sep 2021 1. Redemonstrated pattern of moderate pulmonary fibrosis in a pattern with apical to basal gradient featuring irregular peripheral interstitial opacity, traction bronchiectasis, bronchiolectasis, and areas of honeycombing at the lung bases. Fibrotic findings are not significantly changed in comparison to examination dated 10/30/2019, but are clearly worsened over time on multiple prior examinations dating back to 02/01/2014. 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 2. Cardiomegaly and coronary artery disease. 3. Aortic Atherosclerosis (ICD10-I70.0).   Electronically Signed   By: Eddie Candle M.D.   On: 07/07/2020 09:20    OV 11/17/2020  Subjective:  Patient ID: Stephen [name], male , DOB: 1951-07-04 , age 58 y.o. , MRN: [id number] , ADDRESS: [address] [place] [address] PCP Ronita Hipps, MD Patient Care Team: Ronita Hipps, MD as PCP - General (Family Medicine)  This Provider for this visit: Treatment Team:  Attending Provider: Brand Males, MD    11/17/2020 -   Chief Complaint  Patient presents with  . Follow-up    Pt states he has been doing okay since last visit. States breathing may be a little worse since last visit. Pt was diagnosed with Covid 10/28/20 but just had mild symptoms.     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 HP panel negative.  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 Lung function: Severity -FVC  63% and DLCO 58% showing moderate ILD. Rheum evaluation - 04/25/18 - no evidence of RA (Dr Trudie Reed). Dx of IPF given 06/04/18. STarted ofev 8/11/9. STopped Aug 2020 due to progression and diarrhea. STrted Esbriet -July 25, 2019 .Screen Failed Pliant study due to severe cough - Sept 2021  Severe cough   - Gabapentin for cough July  2021  - 3% saline - since Aug 2021  - hycodan qhs  - Unable to do PFT  DNR but full medical care since aug  2021  On 3LNC    HPI CORMICK MOSS 70 y.o. -presents with his wife.  He had a good holiday but on October 28, 2020 he developed omicron COVID.  He was barely symptomatic.  Wife had some sinus congestion.  He did not deteriorate.  He is doing well now.  This is following an exposure.  He continues his Esbriet.  He continues 3 L nasal cannula he feels stable.  His cough continues to be a problem.  He is not able to do 3% saline now because of backorder.  He is not doing Hycodan because it is too expensive but is willing for me to send another prescription.  Overall stable    CT Chest data  No results found.    PFT  PFT Results Latest Ref Rng & Units 08/27/2019 10/03/2018 08/16/2018 04/11/2018  FVC-Pre L 2.18 2.42 2.53 2.57  FVC-Predicted Pre % 56 65 67 63  Pre FEV1/FVC % % 82 91 89 89  FEV1-Pre L 1.78 2.22 2.26 2.29  FEV1-Predicted Pre % 62 80 81 76  DLCO uncorrected ml/min/mmHg 13.67 - - 16.68  DLCO UNC% % 59 - - 58  DLVA Predicted % 97 - - 106      OV 02/17/2021  Subjective:  Patient ID: Stephen [name], male , DOB: 1951-08-13 , age 55 y.o. , MRN: [id number] , ADDRESS: [address] [place] [address] PCP Ronita Hipps, MD Patient Care Team: Ronita Hipps, MD as PCP - General (Family Medicine)  This Provider for this visit: Treatment Team:  Attending Provider: Brand Males, MD    02/17/2021 -   Chief Complaint  Patient presents with  . Follow-up    Reports increased cough that is sometimes productive with greyish sputum for past  few months.        FU IPF  -trace positive ccp and atypical p-anca- ALL trace positive May 2019 (repeat CCP - October 2020]. HP panel negative.  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 Lung function: Severity -FVC 63% and DLCO 58% showing moderate ILD. Rheum evaluation - 04/25/18 - no evidence of RA (Dr Trudie Reed). Dx of IPF given 06/04/18. STarted ofev 8/11/9. STopped Aug 2020 due to progression and diarrhea. STrted Esbriet -July 25, 2019 .Screen Failed Pliant study due to severe cough - Sept 2021. Lst CT Sept 2021  Severe cough   - Gabapentin for cough July  2021  - 3% saline - since Aug 2021  - hycodan qhs  - Unable to do PFT  DNR but full medical care since aug 2021  Omicron COVID -December 2021  [facility]     HPI Stephen [name] 65 y.o. -returns for follow-up.  Since his last visit wife is reporting persistent cough.  He states he is coughing most of the day.  At night when he sleeps he does not cough but when he goes to the bathroom comes back he coughs quite a bit.  Guaifenesin syrup is not helping.  He used to be on Hycodan but this was on backorder.  3% saline was helping but this is also now on backorder and he is not able to use this.  He is frustrated by all this.  Uses 3 L of oxygen at night but on room air at rest he was fine.  His walking test and symptom scores are listed below.  Symptoms are gradually worsening over time.  While we walked him it also showed that he has  a easier tendency to desaturate compared to the past.  He is on gabapentin for cough but is not helping.  He is on Esbriet and is tolerating this fine.  Last liver function test November 18, 2018 was normal.  Noted that he has lost some weight.  They think it is because of low appetite on pirfenidone but in the symptom questionnaire he did not report low appetite.  Nevertheless he reports to me that his appetite is lower   SYMPTOM SCALE - ILD 2/18/ 2020  06/30/2019 Stop ofev 08/27/2019 esbriet since  July 25, 2019 09/29/2019 esbriet 06/15/2020 243# , esbriet 11/17/2020  02/17/2021 236#, esbriet  O2 use RA ra ra  2 L 3L 3L 3L  Shortness of Breath 0 -> 5 scale with 5 being worst (score 6 If unable to do)        At rest _0 Simple tasks - showers, clothes change, eating, shaving _1 Walking level at own pace 1 1 x _2 Walking up Stairs _3 Total (40 - 48) Dyspnea Score _4 How bad is your cough? _5 How bad is your fatigue _6 0 0        0 0        0 0        0 0        0 0      Simple office walk 185 feet x  3 laps goal with forehead probe 03/05/2018  06/04/2018   09/03/2018  12/17/2018  06/30/2019 Stop ofev Start esbriet 08/27/2019  April 20210 05/19/2020  02/17/2021 (covid dec 2021)  O2 used _7  Room air Needs 2L at rest, 3L exertion 92 93% RA atrest  Number laps completed All 3 laops All 3 laps all3 3 x 185 3    Intended 3 laps  Comments about pace Walks very slow due to baseline balance issue "40% of balance only due to remote trauma  nomral mod pace using cane Slow with cane Slow with ane Slow with cane   Walked with walker  Resting Pulse Ox/HR 100% and 55/min 100% and 57/min 100% and 59/min 100% and 61/min 97% and 56/min 98% and 65/min  92% ra at rest 93% RA at restm ?HR 68  Final Pulse Ox/HR 97% and 78/min 97% and 77/mi 98% and 76/min 95% and 91/min 91% and 87/min 92% and 94/mio   84% - half way of firstr laps, HR 84  Desaturated </= 88% _8  no     Desaturated <= 3% points yes yes no Y84es, 5 points Yes, 6 points Yes, 6 point     Got Tachycardic >/= 90/min no no no yes no yes     Symptoms at end of test No, denied dyspnea x none none none veryt dyspneic     Miscellaneous comments npne x x           PFT  PFT Results Latest Ref Rng & Units 08/27/2019 10/03/2018 08/16/2018 04/11/2018  FVC-Pre L 2.18 2.42 2.53 2.57  FVC-Predicted Pre % 56 65 67  63  Pre FEV1/FVC % % 82 91 89 89  FEV1-Pre L 1.78 2.22  2.26 2.29  FEV1-Predicted Pre % 62 80 81 76  DLCO uncorrected ml/min/mmHg 13.67 - - 16.68  DLCO UNC% % 59 - - 58  DLVA Predicted % 97 - - 106     IMPRESSION: 1. Redemonstrated pattern of moderate pulmonary fibrosis in a pattern with apical to basal gradient featuring irregular peripheral interstitial opacity, traction bronchiectasis, bronchiolectasis, and areas of honeycombing at the lung bases. Fibrotic findings are not significantly changed in comparison to examination dated 10/30/2019, but are clearly worsened over time on multiple prior examinations dating back to 02/01/2014. 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 2. Cardiomegaly and coronary artery disease. 3. Aortic Atherosclerosis (ICD10-I70.0).   Electronically Signed   By: Eddie Candle M.D.   On: 07/07/2020 09:20    has a past medical history of Abnormality of gait (01/21/2014), Allergy, Arthritis, ASVD (arteriosclerotic vascular disease) (02/06/2018), Benign paroxysmal positional vertigo (01/21/2014), Chronic respiratory failure with hypoxia ([place]) (02/13/2020), Dizziness (03/14/2017), GERD (gastroesophageal reflux disease), Heart murmur, Hiatal hernia, Hypercholesteremia (02/05/2018), Hyperlipidemia, Hypertension, Idiopathic pulmonary fibrosis ([place]) (09/11/2018), Obesity (02/05/2018), Overweight (09/11/2018), Post-traumatic headache (01/21/2014), Research study patient (11/21/2019), Sleep apnea, and Vertigo.   reports that he has never smoked. He has never used smokeless tobacco.  Past Surgical History:  Procedure Laterality Date  . CARDIAC CATHETERIZATION  03/22/2001  . LEG SURGERY Bilateral 04/2001    Allergies  Allergen Reactions  . Ciprofloxacin Other (See Comments)    colitis  . Codeine     Immunization History  Administered Date(s) Administered  . Fluad Quad(high Dose  65+) 06/30/2019  . Influenza, High Dose Seasonal PF 08/28/2017, 07/19/2018, 07/22/2020  . PFIZER(Purple Top)SARS-COV-2 Vaccination 11/16/2019, 12/06/2019, 08/12/2020  . Pneumococcal Conjugate-13 08/25/2016  . Pneumococcal Polysaccharide-23 11/18/2007    Family History  Problem Relation Age of Onset  . Dementia Mother   . Heart attack Father   . Aneurysm Father   . Cancer Maternal Grandmother   . Aneurysm Maternal Grandmother      Current Outpatient Medications:  .  amitriptyline (ELAVIL) 75 MG tablet, Take 75 mg by mouth at bedtime., Disp: , Rfl:  .  amLODipine (NORVASC) 10 MG tablet, Take 10 mg by mouth daily., Disp: , Rfl:  .  aspirin EC 81 MG tablet, Take 81 mg by mouth daily., Disp: , Rfl:  .  calcium carbonate (OSCAL) 1500 (600 Ca) MG TABS tablet, Take by mouth 2 (two) times daily with a meal., Disp: , Rfl:  .  calcium carbonate (TUMS - DOSED IN MG ELEMENTAL CALCIUM) 500 MG chewable tablet, Chew 1 tablet by mouth daily., Disp: , Rfl:  .  cholecalciferol (VITAMIN D) 1000 units tablet, Take 1,000 Units by mouth daily., Disp: , Rfl:  .  cloNIDine (CATAPRES) 0.3 MG tablet, Take 0.3 mg by mouth daily after supper. , Disp: , Rfl:  .  dicyclomine (BENTYL) 10 MG capsule, Take 10 mg by mouth 3 (three) times daily before meals. , Disp: , Rfl:  .  ESBRIET 267 MG CAPS, Take 3 capsules three times a day with food., Disp: 270 capsule, Rfl: 11 .  gabapentin (NEURONTIN) 300 MG capsule, TAKE ONE CAPSULE BY MOUTH THREE TIMES DAILY, Disp: 90 capsule, Rfl: 5 .  guaiFENesin-codeine 100-10 MG/5ML syrup, Take 5 mLs by mouth 2 (two) times daily as needed for cough., Disp: 120 mL, Rfl: 0 .  lansoprazole (PREVACID) 30 MG capsule, Take 30 mg by mouth daily at 12 noon., Disp: ,  Rfl:  .  Methylcellulose, Laxative, (CITRUCEL PO), Take 1 Scoop by mouth., Disp: , Rfl:  .  metoprolol tartrate (LOPRESSOR) 50 MG tablet, Take 1 tablet by mouth 2 (two) times daily., Disp: , Rfl:  .  montelukast (SINGULAIR) 10 MG  tablet, Take 10 mg by mouth at bedtime., Disp: , Rfl:  .  Multiple Vitamin (MULTIVITAMIN WITH MINERALS) TABS tablet, Take 1 tablet by mouth daily., Disp: , Rfl:  .  nabumetone (RELAFEN) 750 MG tablet, Take 750 mg by mouth 2 (two) times daily., Disp: , Rfl:  .  olmesartan (BENICAR) 20 MG tablet, Take 1 tablet by mouth daily., Disp: , Rfl:  .  polyethylene glycol (MIRALAX / GLYCOLAX) packet, Take 17 g by mouth daily., Disp: , Rfl:  .  Probiotic Product (PROBIOTIC DAILY PO), Take 1 capsule by mouth daily., Disp: , Rfl:  .  rosuvastatin (CRESTOR) 10 MG tablet, Take 10 mg by mouth daily., Disp: , Rfl:  .  sodium chloride 1 g tablet, Take 3 g by mouth 3 (three) times daily. , Disp: , Rfl:  .  spironolactone (ALDACTONE) 25 MG tablet, Take 25 mg by mouth 3 (three) times a week., Disp: , Rfl:  .  UNABLE TO FIND, Take 1 capsule by mouth 3 (three) times daily. Med Name: blood sugar harmony, Disp: , Rfl:  .  vitamin B-12 (CYANOCOBALAMIN) 1000 MCG tablet, Take 1,000 mcg by mouth daily., Disp: , Rfl:  .  vitamin E 400 UNIT capsule, Take 400 Units by mouth daily., Disp: , Rfl:  .  HYDROcodone-homatropine (HYCODAN) 5-1.5 MG/5ML syrup, Take 5 mLs by mouth 3 (three) times daily as needed for cough., Disp: 450 mL, Rfl: 0 .  sodium chloride HYPERTONIC 3 % nebulizer solution, 3 ml via neb twice daily (Patient not taking: No sig reported), Disp: 180 mL, Rfl: 5      Objective:   Vitals:   02/17/21 1053  BP: 126/78  Pulse: 75  Temp: 98 F (36.7 C)  TempSrc: Temporal  SpO2: 94%  Weight: 236 lb 6.4 oz (107.2 kg)  Height: 5' 6" (1.676 m)    Estimated body mass index is 38.16 kg/m as calculated from the following:   Height as of this encounter: 5' 6" (1.676 m).   Weight as of this encounter: 236 lb 6.4 oz (107.2 kg).  _0 @  Filed Weights   02/17/21 1053  Weight: 236 lb 6.4 oz (107.2 kg)     Physical Exam   General: No distress. Obese . Sitting in chair Neuro: Alert and Oriented x 3. GCS  15. Speech normal Psych: Pleasant Resp:  Barrel Chest - no.  Wheeze - no, Crackles - yes at base, No overt respiratory distress CVS: Normal heart sounds. Murmurs - no Ext: Stigmata of Connective Tissue Disease - no HEENT: Normal upper airway. PEERL +. No post nasal drip   LEss jovial than before     Assessment:       ICD-10-CM   1. IPF (idiopathic pulmonary fibrosis) ([facility])  J84.112   2. Chronic cough  R05.3   3. Therapeutic drug monitoring  Z51.81   4. Chronic respiratory failure with hypoxia ([facility])  J96.11        Plan:     Patient Instructions    Pulmonary fibrosis  Might be getiting worse post covid in dec 2021  - as evidenced by worsening cough (could also be lack of med effect) and easier tendency to desaturate Glad you are managing on 3 L nasal  cannula 24/7 and esbriet without issues But cough continues to be a problem  And destroying your quality of life  -Too bad  3% saline nebulizer is in  backorder  -Too bad that Hycodan is expensive now/backorder  Plan -Check LFT 02/17/2021 (last checked Jan 2022) - continue esbriet for IPF  -Continue Delsym  - continue o2 3 L nasal cannula as before  - no more PFT testing due to cough atlest till cough improves -Refill Hycodan and see price - refill 3% saline and see if backorder has improved -Cough research trial for pulmonary fibrosis patient's COMFORT study when study is approved  - per [facility] still a f ew months away  Followup -6-8 weeks with Dr Chase Caller for face to face visit in 30 min slot  = ILD symptom score and walk test at followup     SIGNATURE    Dr. Brand Males, M.D., F.C.C.P,  Pulmonary and Critical Care Medicine Staff Physician, [place] Director - Interstitial Lung Disease  Program  Pulmonary [place] [facility] [place], [place], [address]  Pager: [phone], If no answer or between  15:00h - 7:00h: call [phone] Telephone: [phone]  11:23 AM 02/17/2021

## 2021-02-17 NOTE — Addendum Note (Signed)
Addended by: Suzzanne Cloud E on: 02/17/2021 11:34 AM   Modules accepted: Orders

## 2021-02-17 NOTE — Patient Instructions (Addendum)
ICD-10-CM   1. IPF (idiopathic pulmonary fibrosis) ([facility])  J84.112   2. Chronic cough  R05.3   3. Therapeutic drug monitoring  Z51.81   4. Chronic respiratory failure with hypoxia ([facility])  J96.11   5. Personal history of COVID-19  Z86.16      Pulmonary fibrosis  Might be getiting worse post covid in dec 2021  - as evidenced by worsening cough (could also be lack of med effect) and easier tendency to desaturate Glad you are managing on 3 L nasal cannula 24/7 and esbriet without issues But cough continues to be a problem  And destroying your quality of life  -Too bad  3% saline nebulizer is in  backorder  -Too bad that Hycodan is expensive now/backorder  Plan -Check LFT 02/17/2021 (last checked Jan 2022) - continue esbriet for IPF  -Continue Delsym  - continue o2 3 L nasal cannula as before  - no more PFT testing due to cough atlest till cough improves -Refill Hycodan and see price - refill 3% saline and see if backorder has improved -Cough research trial for pulmonary fibrosis patient's COMFORT study when study is approved  - per [facility] still a f ew months away  Followup -6-8 weeks with Dr Chase Caller for face to face visit in 30 min slot  = ILD symptom score and walk test at followup

## 2021-02-18 LAB — HEPATIC FUNCTION PANEL
ALT: 19 U/L (ref 0–53)
AST: 33 U/L (ref 0–37)
Albumin: 4 g/dL (ref 3.5–5.2)
Alkaline Phosphatase: 75 U/L (ref 39–117)
Bilirubin, Direct: 0.1 mg/dL (ref 0.0–0.3)
Total Bilirubin: 0.4 mg/dL (ref 0.2–1.2)
Total Protein: 7.7 g/dL (ref 6.0–8.3)

## 2021-02-20 NOTE — Progress Notes (Signed)
LFT normal on anti fibrotic. Will not call with this Normal results

## 2021-02-22 ENCOUNTER — Other Ambulatory Visit: Payer: Self-pay | Admitting: Internal Medicine

## 2021-02-22 DIAGNOSIS — G4733 Obstructive sleep apnea (adult) (pediatric): Secondary | ICD-10-CM | POA: Diagnosis not present

## 2021-02-22 DIAGNOSIS — J84112 Idiopathic pulmonary fibrosis: Secondary | ICD-10-CM | POA: Diagnosis not present

## 2021-02-22 NOTE — Telephone Encounter (Signed)
MR pt is requesting that the cough meds be refilled.  Please advise. Thanks   This was last filled on 11/25/2020 for 120 ML with no additional refills.  Pt was last seen by MR on 02/17/2021

## 2021-02-26 DIAGNOSIS — I10 Essential (primary) hypertension: Secondary | ICD-10-CM | POA: Diagnosis not present

## 2021-02-26 DIAGNOSIS — E785 Hyperlipidemia, unspecified: Secondary | ICD-10-CM | POA: Diagnosis not present

## 2021-03-07 DIAGNOSIS — Z6838 Body mass index (BMI) 38.0-38.9, adult: Secondary | ICD-10-CM | POA: Diagnosis not present

## 2021-03-07 DIAGNOSIS — L408 Other psoriasis: Secondary | ICD-10-CM | POA: Diagnosis not present

## 2021-03-07 DIAGNOSIS — J84112 Idiopathic pulmonary fibrosis: Secondary | ICD-10-CM | POA: Diagnosis not present

## 2021-03-07 DIAGNOSIS — M25559 Pain in unspecified hip: Secondary | ICD-10-CM | POA: Diagnosis not present

## 2021-03-07 DIAGNOSIS — E119 Type 2 diabetes mellitus without complications: Secondary | ICD-10-CM | POA: Diagnosis not present

## 2021-03-15 DIAGNOSIS — M5416 Radiculopathy, lumbar region: Secondary | ICD-10-CM | POA: Diagnosis not present

## 2021-03-15 DIAGNOSIS — M25551 Pain in right hip: Secondary | ICD-10-CM | POA: Insufficient documentation

## 2021-03-15 HISTORY — DX: Pain in right hip: M25.551

## 2021-03-16 DIAGNOSIS — M5416 Radiculopathy, lumbar region: Secondary | ICD-10-CM | POA: Diagnosis not present

## 2021-03-16 DIAGNOSIS — I700 Atherosclerosis of aorta: Secondary | ICD-10-CM

## 2021-03-16 HISTORY — DX: Atherosclerosis of aorta: I70.0

## 2021-03-18 ENCOUNTER — Other Ambulatory Visit: Payer: Self-pay | Admitting: Internal Medicine

## 2021-03-18 MED ORDER — GUAIFENESIN-CODEINE 100-10 MG/5ML PO SOLN: 5.0000 mL | Freq: Two times a day (BID) | ORAL | 3 refills | Status: DC | PRN

## 2021-03-18 MED ORDER — GUAIFENESIN-CODEINE 100-10 MG/5ML PO SOLN: ORAL | 3 refills | Status: DC

## 2021-03-18 NOTE — Telephone Encounter (Signed)
I am ok to refill his cough syrup. Pleas do and LMK

## 2021-03-18 NOTE — Addendum Note (Signed)
Addended by: Lorretta Harp on: 03/18/2021 05:29 PM   Modules accepted: Orders

## 2021-03-18 NOTE — Telephone Encounter (Signed)
Continues to print than given otion to do finger print sign for cough sryrup. AFter wekend please address

## 2021-03-21 NOTE — Telephone Encounter (Signed)
Rx reput in. Routing to MR.

## 2021-03-22 ENCOUNTER — Other Ambulatory Visit: Payer: Self-pay | Admitting: Internal Medicine

## 2021-03-22 DIAGNOSIS — R053 Chronic cough: Secondary | ICD-10-CM

## 2021-03-22 DIAGNOSIS — J84112 Idiopathic pulmonary fibrosis: Secondary | ICD-10-CM

## 2021-03-22 MED ORDER — GUAIFENESIN-CODEINE 100-10 MG/5ML PO SOLN: 5.0000 mL | Freq: Two times a day (BID) | ORAL | 3 refills | Status: DC | PRN

## 2021-03-22 NOTE — Progress Notes (Signed)
For some reason every time order is done is printing instead of fingerprint sign option for cough syrup

## 2021-03-23 ENCOUNTER — Telehealth: Payer: Self-pay | Admitting: Internal Medicine

## 2021-03-23 ENCOUNTER — Encounter: Payer: Self-pay | Admitting: Internal Medicine

## 2021-03-23 ENCOUNTER — Other Ambulatory Visit: Payer: Self-pay | Admitting: *Deleted

## 2021-03-23 ENCOUNTER — Other Ambulatory Visit: Payer: Self-pay

## 2021-03-23 ENCOUNTER — Ambulatory Visit: Payer: PPO | Admitting: Internal Medicine

## 2021-03-23 VITALS — BP 126/74 | HR 70 | Temp 97.3°F | Ht 66.0 in | Wt 233.8 lb

## 2021-03-23 DIAGNOSIS — R053 Chronic cough: Secondary | ICD-10-CM

## 2021-03-23 DIAGNOSIS — J84112 Idiopathic pulmonary fibrosis: Secondary | ICD-10-CM

## 2021-03-23 DIAGNOSIS — Z5181 Encounter for therapeutic drug level monitoring: Secondary | ICD-10-CM | POA: Diagnosis not present

## 2021-03-23 DIAGNOSIS — Z8616 Personal history of COVID-19: Secondary | ICD-10-CM

## 2021-03-23 DIAGNOSIS — J9611 Chronic respiratory failure with hypoxia: Secondary | ICD-10-CM

## 2021-03-23 LAB — HEPATIC FUNCTION PANEL
ALT: 14 U/L (ref 0–53)
AST: 21 U/L (ref 0–37)
Albumin: 4 g/dL (ref 3.5–5.2)
Alkaline Phosphatase: 83 U/L (ref 39–117)
Bilirubin, Direct: 0.1 mg/dL (ref 0.0–0.3)
Total Bilirubin: 0.3 mg/dL (ref 0.2–1.2)
Total Protein: 7.6 g/dL (ref 6.0–8.3)

## 2021-03-23 MED ORDER — PREDNISONE 10 MG PO TABS: ORAL_TABLET | ORAL | 0 refills | Status: DC

## 2021-03-23 MED ORDER — PREDNISONE 10 MG PO TABS: ORAL_TABLET | ORAL | 0 refills | Status: AC

## 2021-03-23 MED ORDER — HYDROCODONE BIT-HOMATROP MBR 5-1.5 MG/5ML PO SOLN: 5.0000 mL | Freq: Three times a day (TID) | ORAL | 0 refills | Status: DC | PRN

## 2021-03-23 MED ORDER — GUAIFENESIN-CODEINE 100-10 MG/5ML PO SOLN: 5.0000 mL | Freq: Two times a day (BID) | ORAL | 0 refills | Status: DC | PRN

## 2021-03-23 NOTE — Telephone Encounter (Signed)
LEt [name] know that LFT normal and find out if they were able to fil the opiodis. IF on backorder, we can also try tussionex

## 2021-03-23 NOTE — Addendum Note (Signed)
Addended by: Lorretta Harp on: 03/23/2021 11:39 AM   Modules accepted: Orders

## 2021-03-23 NOTE — Progress Notes (Signed)
IOV 03/05/2018  Chief Complaint  Patient presents with  . Consult    Referral per MD Helene Kelp, Dec 2018 had sinus infection and still has cough, hx of mild bronchiectasis, later was given symbicort which patient believes is making him more SOB.      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  He lives in [facility] and presents with his wife for evaluation of pulmonary fibrosis to the ILD clinic.  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  During this time he was not exposed to any metal dust but he was exposed to mold and damp environments intermittently but not always.  He denies any mold or mildew in the house.  He tells me that in December 2018 he had a sinus infection and since then has a lingering cough that never really went away.  It looks like from the primary care physician he saw dentist and from there he had imaging and this finally all resulted in a high-resolution CT chest January 21, 2018 that I personally visualized.  The official report is that it is indeterminate for UIP.  In my personal visualization there is bilateral bibasal symmetric disease with definite of craniocaudal gradient.  The septal thickening and also cylindrical bronchiectasis and peripheral bronchiec bronchiolectasis.  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  He does not have much of her shortness of breath but then he is morbidly obese and has balance issues and is quite limited.  He was seen by a local pulmonologist will give him a 2-5-year survival rate and therefore he is frustrated.  He was started on Symbicort which actually made things worse for him.  [place] pulmonary interstitial lung disease questionnaire  Symptoms: Since dec 2019. Some sputjm +. Better since started. Insidious dyspnea x 4 months. Same since onset. No episodic  dyspnea. lLeve 4 dyspnea walking wiuth peers and level 5 walking up hill/stairs. Level 1 for dressing, Level 2 fo rshopping and mpwing lawn  ROS: chronic balance issues affecting mobiluity.  In association with this he has fatigue for the last 3 years.  He has dry mouth for the last several years associated with some dysphagia for the last 10 years heartburn and acid reflux for several years.  Daytime set sleepiness for the last several years but denies any oral ulcers or rash or weight loss or recurrent fever or arthralgia  Past medical history: Denies any asthma COPD or heart failure rheumatoid arthritis or scleroderma or systemic lupus erythematosus or polymyositis or Sjogren's but is positive for sleep apnea and hiatal hernia.  Negative for pulmonary hypertension diabetes thyroid disease stroke seizures hepatitis tuberculosis kidney disease blood clots heart disease other than murmur pleurisy  Family history of pulmonary disease: Positive for asthma in the mother but nobody with pulmonary fibrosis  Personal exposure history: He has never smoked cigarettes did smoke cigars for 3 years.  [place]  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  During this time there is intermittent mold exposure  Pulmonary toxicity history denies a laundry list of pulmonary toxicity medications    OV 04/11/2018  Chief Complaint  Patient presents with  . Follow-up    PFT performed today.  Pt is still coughing with occ. white mucus and pt also states he believes SOB is worse and is becoming SOB more easily.    Follow-up interstitial lung disease work-up  Symptoms: In the interim no new symptoms.  He does admit to having  chronic arthralgia for many decades after sheetrock fell on him.  He also has joint stiffness especially in the hands but he does not know if it is early morning stiffness.  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 Lorin Picket.  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  His autoimmune and vascular does panel shows trace positivity for ANCA and cyclic citrulline peptide.    OV 06/04/2018   Chief Complaint  Patient presents with  . Follow-up    Pt states things have been about the same since last visit.   FU ILD - ccp and atypical p-anca trace positive May 2019. HP panel negative.  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 Lung function: Severity -FVC 63% and DLCO 58% showing moderate ILD. Rheum evaluation - 04/25/18 - no evidence of RA (Dr Trudie Reed)  Here with his wife to discuss test results. In the interim he saw Dr. Gavin Pound on 04/25/2018. She is of rheumatology. Clinically she did not find any evidence of systemic rheumatoid arthritis or vasculitis. Therefore he is here for decision making regarding his ILD. In the interim there are no new issues.  OV 07/16/2018  Subjective:  Patient ID: Stephen [name], male , DOB: 1951-10-23 , age 70 y.o. , MRN: [id number] , ADDRESS: [address] [place] [address]   07/16/2018 -   Chief Complaint  Patient presents with  . Follow-up    Pt started OFEV 8/11 and so far denies any complaints.    FU ILD - ccp and atypical p-anca trace positive May 2019. HP panel negative.  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 Lung function: Severity -FVC 63% and DLCO 58% showing moderate ILD. Rheum evaluation - 04/25/18 - no evidence of RA (Dr Trudie Reed). Dx of IPF given 06/04/18. STarted ofev 8/11/9    HPI Stephen [name] 70 y.o. - presents for follow-up of his idiopathic pulmonary fibrosis. Diagnoses given 06/04/2018. He is started on  Ofev 06/09/2018. He is here with his wife. He says he is tolerating the Ofev just fine. No GI issues. He gets support from the Child psychotherapist Clarene Critchley. He will have a liver function test today. He deferred flu shot because he will have it with his primary care physician. He told me that he is planning to join the pulmonary fibrosis foundation patient support group. Next meeting is 07/25/2018 Thursday 6 PM at [facility]. He is also willing to join research trials.He has mobility issues on account of his obesity and cane use. He says he cannot do a treadmill or walk long distances.   He has chronic tinnitus and apparently this is from working in [employer] base during [place] War although he was never deployed overseas. He started getting VA eligibility. He asked [place] to fill forms. ROS - per HPI  OV 09/03/2018  Subjective:  Patient ID: Sandy Salaam  Sammie Bench, male , DOB: 1951-07-12 , age 4 y.o. , MRN: [id number] , ADDRESS: [address] [place] [address]   09/03/2018 -   Chief Complaint  Patient presents with  . Follow-up    Doing well at this time coughing up white mucus     FU ILD - ccp and atypical p-anca trace positive May 2019. HP panel negative.  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 Lung function: Severity -FVC 63% and DLCO 58% showing moderate ILD. Rheum evaluation - 04/25/18 - no evidence of RA (Dr Trudie Reed). Dx of IPF given 06/04/18. STarted ofev 8/11/9   HPI Stephen [name] 51 y.o. -returns for follow-up of his IPF.  He has been on nintedanib since mid August 2019.  He is here with his wife.  So far is tolerating the nintedanib just fine.  He says that his baseline constipation still continues and he needs both MiraLAX and Citrucel.  However what he notices that when he has relief of constipation it is diarrhea.  He says he is not able to come down on the laxative dose because otherwise the constipation returns.  He feels the 05 is not causing any diarrhea.  Is no  abdominal pain nausea vomiting.  In terms of shortness of breath is stable.  He tried to have pulmonary function test as follow-up like a few weeks ago but he started coughing and did not produce enough spirometry's.  All we have the symptoms which is stable.  He is up-to-date with his flu shot.  He is interested in research protocols.  He uses a cane normally.      OV 12/17/2018  Subjective:  Patient ID: Stephen [name], male , DOB: [birth year] , age 56 y.o. , MRN: [id number] , ADDRESS: [address] [place] [address]   12/17/2018 -   Chief Complaint  Patient presents with  . Follow-up    PFT performed today but pt was unable to perform DLCO due to coughing. Pt states SOB is about the same. Denies any CP or chest tightness.    FU ILD - ccp and atypical p-anca- ALL trace positive May 2019. HP panel negative.  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 Lung function: Severity -FVC 63% and DLCO 58% showing moderate ILD. Rheum evaluation - 04/25/18 - no evidence of RA (Dr Trudie Reed). Dx of IPF given 06/04/18. STarted ofev 8/11/9   HPI Stephen [name] 87 y.o. -IPF follow-up.  He presents with his wife as usual.  He tells me that starting January 2020 nintedanib started causing significant diarrhea.  By the third week of January 2020 he reduced himself to 1 tablet once daily at 150 mg.  With this there is no diarrhea.  He says that at the full dose he would take Imodium for diarrhea and then he would get constipated for several days.  He would follow this with a laxative and then he would have explosive diarrhea.  He said the symptoms became unmanageable.  In terms of his IPF itself he is stable.  His other main issues worsening chronic cough.  He has had chronic cough for b for several years predating IPF diagnosis.  He has been on ACE inhibitor's but it appears that lisinopril got rotated to benazepril and then to losartan but the cough never improved.  So he got switch back to benazepril.  He says that his  primary care team is aware that all these agents can cause cough  but the emphasis was on controlling the blood pressure.  He says the cough is severe.  Mostly in the daytime very rarely at night.  There is a laryngeal hoarse quality to his cough.  Noticed results on fish oil and carvedilol.  We discussed about neuropathic treatment for cough but he is already on amitriptyline ROS - per HPI   OV 06/30/2019  Subjective:  Patient ID: Stephen [name], male , DOB: 1951-10-05 , age 32 y.o. , MRN: [id number] , ADDRESS: [address] [place] [address]   06/30/2019 -   Chief Complaint  Patient presents with  . IPF (idiopathic pulmonary fibrosis)    Does not feel the breathing is any different than last visit. Still using Ofev, but still having diarrhea with it.     FU ILD - ccp and atypical p-anca- ALL trace positive May 2019. HP panel negative.  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 Lung function: Severity -FVC 63% and DLCO 58% showing moderate ILD. Rheum evaluation - 04/25/18 - no evidence of RA (Dr Trudie Reed). Dx of IPF given 06/04/18. STarted ofev 06/09/18  HPI Stephen [name] 40 y.o. -presents for follow-up with his wife.  He has a diagnosis of IPF.  He has been on nintedanib since August 2019.  He preferred to take nintedanib initially because of his preference to have diarrhea.  At this point in time he tells me that he is having progressive dyspnea.  Early in the spring 2020 he noticed that he could not walk back from his office to the front door.  He could do this in 2019.  He also has had difficulty mowing the lawn with the more.  In addition is having significant diarrhea from the nintedanib.  It is severe despite agents.  He is willing to change the drug.  In between he saw the nurse practitioner he had a high-resolution CT chest in August 2020.  He has mildly progressive disease even compared to March 2020.  Walking desaturation test and symptom scores show evidence of progression and documented  below.       OV 08/27/2019  Subjective:  Patient ID: Stephen [name], male , DOB: [birth year] , age 30 y.o. , MRN: [id number] , ADDRESS: [address] [place] [address]  FU IPF  - ccp and atypical p-anca- ALL trace positive May 2019. HP panel negative.  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 Lung function: Severity -FVC 63% and DLCO 58% showing moderate ILD. Rheum evaluation - 04/25/18 - no evidence of RA (Dr Trudie Reed). Dx of IPF given 06/04/18. STarted ofev 8/11/9. STopped Aug 2020 due to progression and diarrhea. STrted Esbriet -July 25, 2019   08/27/2019 -   Chief Complaint  Patient presents with  . Follow-up    Patient reports that his breathing is about the same. He has sob with exertion and productive cough.      HPI Stephen [name] 89 y.o. -presents with his wife for follow-up.  He tells me that Jacklynn Bue was started on July 25, 2019.  He took 3 weeks for it to come.  During this time he was off nintedanib and his diarrhea resolved. Wife tells me that he has been completely sedentary because of shortness of breath.  He feels the shortness of breath is getting worse.  He is also gained weight because of his sedentary lifestyle.  He is already morbidly obese.  He has pedal edema for which she wears stockings.  He is not using oxygen at night because he is never needed it.  He is very concerned about the shortness of breath getting worse.  In fact it is worse even since his last visit in August 2020.  This is documented in the questionnaire below.  Also seen in the pulmonary function test.  His CT scan of the chest August 2020 shows worsening within 5 months.  I reviewed this with him.  He tells me that even taking a shower makes him desaturate although today he did not desaturate here in with a forehead probe to 88%.  However he did not do a 6-minute walk test.  He feels he would benefit from oxygen   IMPRESSION: HRCT Augu 2020 compared to March 2020 1. Spectrum of findings  compatible with basilar predominant fibrotic interstitial lung disease with probable early honeycombing at the right costophrenic angle. Slight interval progression. Findings are consistent with UIP per consensus guidelines: Diagnosis of Idiopathic Pulmonary Fibrosis: An Official ATS/ERS/JRS/ALAT Clinical Practice Guideline. [place], Iss 5, [phone], Jun 30 2017. 2. Mild mediastinal lymphadenopathy is stable, compatible with benign reactive adenopathy. 3. Three-vessel coronary atherosclerosis.  Aortic Atherosclerosis (ICD10-I70.0).   Electronically Signed   By: Ilona Sorrel M.D.   On: 06/28/2019 16:35  ROS - per HPI   OV 09/29/2019  Subjective:  Patient ID: Stephen [name], male , DOB: 09-Jul-1951 , age 31 y.o. , MRN: [id number] , ADDRESS: [address] [place] [address]   FU IPF  -trace positive ccp and atypical p-anca- ALL trace positive May 2019 (repeat CCP - October 2020]. HP panel negative.  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 Lung function: Severity -FVC 63% and DLCO 58% showing moderate ILD. Rheum evaluation - 04/25/18 - no evidence of RA (Dr Trudie Reed). Dx of IPF given 06/04/18. STarted ofev 8/11/9. STopped Aug 2020 due to progression and diarrhea. STrted Esbriet -July 25, 2019   Associated sleep apnea present   Last CT 06/27/2019 Low risk cardiac stress test April 2019.   09/29/2019 -   Chief Complaint  Patient presents with  . Follow-up    Pt states his breathing has become worse since last visit and also has a worsening cough and is coughing up dark green phlegm which he has had for awhile now. Pt is on 2L O2 24/7.      HPI Stephen [name] 54 y.o. -he is now attending pulmonary rehabilitation.  On September 16, 2019 he did have a 6-minute walk test.  He did desaturate to 80% from 96%.  He dropped his pulse ox to 86% at the second minut.  He did correct with 2 L of oxygen.  E also in the interim he did have overnight oxygen study  and we have recommended 2 L nasal cannula oxygen with the CPAP.  He is now wearing that.  Of note, during the overnight oxygen study his heart rate was 48-50.  Today he also had a 6-minute walk test at our office on 2 L oxygen and with this his pulse ox did not drop.  Overall he is reporting stability.  This can be demonstrated by the symptom questionnaire below.  He does have significant amount of shortness of breath despite wearing his 2 L.  Even though the 2 L helps his dyspnea he still says the dyspnea is significant.  He still has chronic significant pedal edema.  He continues to be morbidly obese.  We discussed  his diet.  His wife states that he eats all the time and cannot stop eating.  Apparently he goes through a carton of ice cream every few days.  He says that he will not be able to control his dietary indiscretion.  He is not interested in lung transplant.  He is enjoying pulmonary rehabilitation.  Given his diastolic dysfunction I did refer him to his cardiologist but he says he does not have an appointment.    02/13/2020 Follow up : ILD  Patient presents for a 71-monthfollow-up.  Patient says overall breathing is doing about the same.  He has had no flare of his cough or shortness of breath.  Says his activity level is about at baseline.  Says he would like to be more active has been try to do some yard work however is very hard for him to carry his oxygen tanks.  Would like a portable oxygen concentrator.  Walk test in the office shows desaturation below 88% on room air.  Requires 2 L of oxygen to keep O2 saturations greater than 88%.  Patient gets short of breath with heavy activity.  Has to stop and rest several times.  He remains on Esbriet.  Denies any nausea vomiting diarrhea.    OV 05/19/2020   Subjective:  Patient ID: RLevin [name] male , DOB: 6March 10, 1952 age 70y.o. years. , MRN: [id number]  ADDRESS: [address] [place] [id number]  HRonita Hipps MD Providers : Treatment Team:   Attending Provider: RBrand Males MD  FU IPF  -trace positive ccp and atypical p-anca- ALL trace positive May 2019 (repeat CCP - October 2020]. HP panel negative.  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 Lung function: Severity -FVC 63% and DLCO 58% showing moderate ILD. Rheum evaluation - 04/25/18 - no evidence of RA (Dr HTrudie Reed. Dx of IPF given 06/04/18. STarted ofev 8/11/9. STopped Aug 2020 due to progression and diarrhea. STrted Esbriet -July 25, 2019.   Chief Complaint  Patient presents with  . Follow-up    Productive cough with brownish-yellow phlegm       HPI Stephen TISO89y.o. -presents for follow-up.  Last seen in #2020 as standard of care.  After that he was on the GCorinthstudy which prematurely ended.  He continues in his bed without any problems.  He has not lost any weight.  However he is having progressive shortness of breath and progressive cough.  Particularly worse in the last 1 month.  In the last few days the cough is even more worse.  Last night he was coughing all night.  He is not able to talk without coughing.  When he takes a shower with 2 L nasal cannula he desaturates to 84%.  When he walks in the mall with 2 L nasal cannula he desaturates to 84%.  He has been asked to uptitrate his oxygen to 4 L with exertion but is not done that.  He is also got some brown-yellow sputum for the last 1 month.  There is no wheezing.  Hycodan cough syrup is not helping.  He is frustrated by all this.  And so is his wife.  There is no edema chest pain.  He easily desaturates.    06/09/2020- Interim hx Patient contacted today for virtual telephone visit/2 to 3-week follow-up ILD. Cough may be a litle bit better but is still productive with yellow/brown mucus. Cough is not new. Hydromet medication does help. He  did not notice any difference after completing doxycycline/prednisone course. No increase in oxygen demand. Denies fever, chest tightness, wheezing.      OV  06/15/2020   Subjective:  Patient ID: Stephen [name], male , DOB: October 06, 1951, age 41 y.o. years. , MRN: [id number],  ADDRESS: [address] [place] [address] PCP  Ronita Hipps, MD Providers : Treatment Team:  Attending Provider: Brand Males, MD    FU IPF  -trace positive ccp and atypical p-anca- ALL trace positive May 2019 (repeat CCP - October 2020]. HP panel negative.  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 Lung function: Severity -FVC 63% and DLCO 58% showing moderate ILD. Rheum evaluation - 04/25/18 - no evidence of RA (Dr Trudie Reed). Dx of IPF given 06/04/18. STarted ofev 8/11/9. STopped Aug 2020 due to progression and diarrhea. STrted Esbriet -July 25, 2019 Gabapentin for cough July  2021  DNR since aug 2021    Chief Complaint  Patient presents with  . Follow-up    Pt states that he is about the same since last visit. States still has problems with SOB.       HPI Stephen [name] 67 y.o. -presents for follow-up of his IPF.  He is tolerating pirfenidone after last visit in July 2021.  At that visit cough is the dominant feature.  We started him on gabapentin once daily at night.  He has tolerated it well.  He says the cough is improved somewhat but not significantly with it.  It is definitely helping him though.  Doxycycline and prednisone did not help him.  In fact the prednisone burst made him a little bit restless.  He is now requiring 3 L of oxygen at rest.  Previously it was 2 L.  His last CT scan of the chest was December 2020.  In the interim he saw a Designer, jewellery.  He is confirmed his DNR status.  He is really frustrated by the cough.  He feels that his sputum inside but unable to cough.  He is gagging and is trying to clear his throat a lot.  In addition his wife tells me last week for a few days he had bilateral leg weakness and had increased imbalance issues.  However it was not a focal neurologic deficit.  There is no bladder or bowel problems.  It has since  resolved.  There is no speech issues.  There is no back pain.  They are to talk to the primary care physician about this.      OV 08/24/2020  Subjective:  Patient ID: Stephen [name], male , DOB: 1951-02-28 , age 1 y.o. , MRN: [id number] , ADDRESS: [address] [place] [address] PCP Ronita Hipps, MD Patient Care Team: Ronita Hipps, MD as PCP - General (Family Medicine)  This Provider for this visit: Treatment Team:  Attending Provider: Brand Males, MD  Type of visit: Telephone/Video Circumstance: COVID-19 national emergency Identification of patient Stephen [name] with [birth year] and MRN [id number] - 2 person identifier Risks: Risks, benefits, limitations of telephone visit explained. Patient understood and verbalized agreement to proceed Anyone else on call: yes - wife Patient location:his home This provider location: home due to extenutating circumstance   08/24/2020 - FU IPF   FU IPF  -trace positive ccp and atypical p-anca- ALL trace positive May 2019 (repeat CCP - October 2020]. HP panel negative.  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 Lung function: Severity -  FVC 63% and DLCO 58% showing moderate ILD. Rheum evaluation - 04/25/18 - no evidence of RA (Dr Trudie Reed). Dx of IPF given 06/04/18. STarted ofev 8/11/9. STopped Aug 2020 due to progression and diarrhea. STrted Esbriet -July 25, 2019 .Screen Failed Pliant study due to severe cough - Sept 2021  Severe cough   - Gabapentin for cough July  2021  - 3% saline - since Aug 2021  - hycodan qhs  - Unable to do PFT  DNR but full medical care since aug 2021  On 3LNC   HPI Stephen [name] 18 y.o. - telephone visit. Joined by wife. Says dyspnea stable on 3L [place] continuous and baseline. Says cough improved with rx of gabapentin, 3% saline and hycodan qhs. Wants refill on latter. Did not qualify for pliant study due to cough interfereing with PFT quality. Tolerating esbriet well. No new issues        PFT Results  Latest Ref Rng & Units 08/27/2019 10/03/2018 08/16/2018 04/11/2018  FVC-Pre L 2.18 2.42 2.53 2.57  FVC-Predicted Pre % 56 65 67 63  Pre FEV1/FVC % % 82 91 89 89  FEV1-Pre L 1.78 2.22 2.26 2.29  FEV1-Predicted Pre % 62 80 81 76  DLCO uncorrected ml/min/mmHg 13.67 - - 16.68  DLCO UNC% % 59 - - 58  DLVA Predicted % 97 - - 106    ROS - per HPI  IMPRESSION: HRCT Sep 2021 1. Redemonstrated pattern of moderate pulmonary fibrosis in a pattern with apical to basal gradient featuring irregular peripheral interstitial opacity, traction bronchiectasis, bronchiolectasis, and areas of honeycombing at the lung bases. Fibrotic findings are not significantly changed in comparison to examination dated 10/30/2019, but are clearly worsened over time on multiple prior examinations dating back to 02/01/2014. 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 2. Cardiomegaly and coronary artery disease. 3. Aortic Atherosclerosis (ICD10-I70.0).   Electronically Signed   By: Eddie Candle M.D.   On: 07/07/2020 09:20    OV 11/17/2020  Subjective:  Patient ID: Stephen [name], male , DOB: 1951-03-05 , age 38 y.o. , MRN: [id number] , ADDRESS: [address] [place] [address] PCP Ronita Hipps, MD Patient Care Team: Ronita Hipps, MD as PCP - General (Family Medicine)  This Provider for this visit: Treatment Team:  Attending Provider: Brand Males, MD    11/17/2020 -   Chief Complaint  Patient presents with  . Follow-up    Pt states he has been doing okay since last visit. States breathing may be a little worse since last visit. Pt was diagnosed with Covid 10/28/20 but just had mild symptoms.     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 HP panel negative.  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 Lung function: Severity -FVC  63% and DLCO 58% showing moderate ILD. Rheum evaluation - 04/25/18 - no evidence of RA (Dr Trudie Reed). Dx of IPF given 06/04/18. STarted ofev 8/11/9. STopped Aug 2020 due to progression and diarrhea. STrted Esbriet -July 25, 2019 .Screen Failed Pliant study due to severe cough - Sept 2021  Severe cough   - Gabapentin for cough July  2021  - 3% saline - since Aug 2021  - hycodan qhs  - Unable to do PFT  DNR but full medical care since aug 2021  On 3LNC    HPI Stephen [name] 45 y.o. -presents with his wife.  He had a good holiday but on October 28, 2020 he developed omicron COVID.  He was barely symptomatic.  Wife had some sinus congestion.  He did not deteriorate.  He is doing well now.  This is following an exposure.  He continues his Esbriet.  He continues 3 L nasal cannula he feels stable.  His cough continues to be a problem.  He is not able to do 3% saline now because of backorder.  He is not doing Hycodan because it is too expensive but is willing for me to send another prescription.  Overall stable    CT Chest data  No results found.    PFT  PFT Results Latest Ref Rng & Units 08/27/2019 10/03/2018 08/16/2018 04/11/2018  FVC-Pre L 2.18 2.42 2.53 2.57  FVC-Predicted Pre % 56 65 67 63  Pre FEV1/FVC % % 82 91 89 89  FEV1-Pre L 1.78 2.22 2.26 2.29  FEV1-Predicted Pre % 62 80 81 76  DLCO uncorrected ml/min/mmHg 13.67 - - 16.68  DLCO UNC% % 59 - - 58  DLVA Predicted % 97 - - 106      OV 02/17/2021  Subjective:  Patient ID: Stephen [name], male , DOB: [birth year] , age 2 y.o. , MRN: [id number] , ADDRESS: [address] [place] [address] PCP Ronita Hipps, MD Patient Care Team: Ronita Hipps, MD as PCP - General (Family Medicine)  This Provider for this visit: Treatment Team:  Attending Provider: Brand Males, MD    02/17/2021 -   Chief Complaint  Patient presents with  . Follow-up    Reports increased cough that is sometimes productive with greyish sputum for past  few months.        FU IPF  -trace positive ccp and atypical p-anca- ALL trace positive May 2019 (repeat CCP - October 2020]. HP panel negative.  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 Lung function: Severity -FVC 63% and DLCO 58% showing moderate ILD. Rheum evaluation - 04/25/18 - no evidence of RA (Dr Trudie Reed). Dx of IPF given 06/04/18. STarted ofev 8/11/9. STopped Aug 2020 due to progression and diarrhea. STrted Esbriet -July 25, 2019 .Screen Failed Pliant study due to severe cough - Sept 2021. Lst CT Sept 2021  Severe cough   - Gabapentin for cough July  2021  - 3% saline - since Aug 2021  - hycodan qhs  - Unable to do PFT  DNR but full medical care since aug 2021  Omicron COVID -December 2021  On [facility]     HPI Stephen [name] 57 y.o. -returns for follow-up.  Since his last visit wife is reporting persistent cough.  He states he is coughing most of the day.  At night when he sleeps he does not cough but when he goes to the bathroom comes back he coughs quite a bit.  Guaifenesin syrup is not helping.  He used to be on Hycodan but this was on backorder.  3% saline was helping but this is also now on backorder and he is not able to use this.  He is frustrated by all this.  Uses 3 L of oxygen at night but on room air at rest he was fine.  His walking test and symptom scores are listed below.  Symptoms are gradually worsening over time.  While we walked him it also showed that he has a easier  tendency to desaturate compared to the past.  He is on gabapentin for cough but is not helping.  He is on Esbriet and is tolerating this fine.  Last liver function test November 18, 2018 was normal.  Noted that he has lost some weight.  They think it is because of low appetite on pirfenidone but in the symptom questionnaire he did not report low appetite.  Nevertheless he reports to me that his appetite is lower PFT     IMPRESSION: 1. Redemonstrated pattern of moderate pulmonary fibrosis in  a pattern with apical to basal gradient featuring irregular peripheral interstitial opacity, traction bronchiectasis, bronchiolectasis, and areas of honeycombing at the lung bases. Fibrotic findings are not significantly changed in comparison to examination dated 10/30/2019, but are clearly worsened over time on multiple prior examinations dating back to 02/01/2014. 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 2. Cardiomegaly and coronary artery disease. 3. Aortic Atherosclerosis (ICD10-I70.0).   Electronically Signed   By: Eddie Candle M.D.   On: 07/07/2020 09:20   OV 03/23/2021  Subjective:  Patient ID: Stephen [name], male , DOB: 1951-02-12 , age 73 y.o. , MRN: [id number] , ADDRESS: [address] [place] [address] PCP Ronita Hipps, MD Patient Care Team: Ronita Hipps, MD as PCP - General (Family Medicine)  This Provider for this visit: Treatment Team:  Attending Provider: Brand Males, MD    03/23/2021 -   Chief Complaint  Patient presents with  . Follow-up    Pt is still having problems with the cough. States breathing is about the same      FU IPF  -trace positive ccp and atypical p-anca- ALL trace positive May 2019 (repeat CCP - October 2020]. HP panel negative.  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 Lung function: Severity -FVC 63% and DLCO 58% showing moderate ILD. Rheum evaluation - 04/25/18 - no evidence of RA (Dr Trudie Reed). Dx of IPF given 06/04/18. STarted ofev 8/11/9. STopped Aug 2020 due to progression and diarrhea. STrted Esbriet -July 25, 2019 .Screen Failed Pliant study due to severe cough - Sept 2021. Lst CT Sept 2021  Severe cough   - Gabapentin for cough July  2021  - 3% saline - since Aug 2021  - hycodan qhs -> backorder and codeine wih guaifeneisn -> also backorder -> ran out spring 2022  - Unable to do PFT  DNR but full medical care since aug  2021  [place] -December 2021  On [facility]   HPI Stephen [name] 42 y.o. -returns for follow-up.  At this point in time he is not able to get Hycodan or guaifenesin.  Because both are on backorder according to upstream.  In addition we tried to fill it.  For some reason I electronic medical record is printing paper on this.  It is not doing electronic routing to the pharmacy.  We do not know why.  We tried this multiple times over the course of the last few days.  Symptoms are stable.  Uses 3 L oxygen.  He is tolerating pirfenidone.  He is continues to lose weight because of low appetite.  We think this because of pirfenidone.  He is morbidly obese with a weight loss is healthy at this point.  However his cough is a chronic nagging problem.  This is despite gabapentin.  He does not want to do daily prednisone but after  talking to him he was open to doing a few days of a prednisone burst.  We have tried to refill his 2 opiate prescriptions by paper.  His wife reports that she will take it to pharmacy and if there are problems she will let us know.  His most recent liver function was a few months ago.     SYMPTOM SCALE - ILD 2/18/ 2020  06/30/2019 Stop ofev 08/27/2019 esbriet since July 25, 2019 09/29/2019 esbriet 06/15/2020 243# , esbriet 11/17/2020  02/17/2021 236#, esbriet 03/23/2021 233# = esbriet  O2 use RA ra ra  2 L 3L 3L 3L 3L  Shortness of Breath 0 -> 5 scale with 5 being worst (score 6 If unable to do)         At rest _0 Simple tasks - showers, clothes change, eating, shaving _1 Walking level at own pace 1 1 x _2 Walking up Stairs _3 Total (40 - 48) Dyspnea Score _4 How bad is your cough? _5 How bad is your fatigue _6 0 0 0        0 0 0        0 0 0        0 0 3        0 0 0      Simple office walk 185 feet x  3 laps goal with forehead probe 03/05/2018  06/04/2018    09/03/2018  12/17/2018  06/30/2019 Stop ofev Start esbriet 08/27/2019  April 20210 05/19/2020  02/17/2021 (covid dec 2021) 03/23/2021   O2 used _7  Room air Needs 2L at rest, 3L exertion 92 93% RA atrest   Number laps completed All 3 laops All 3 laps all3 3 x 185 3    Intended 3 laps   Comments about pace Walks very slow due to baseline balance issue "40% of balance only due to remote trauma  nomral mod pace using cane Slow with cane Slow with ane Slow with cane   Walked with walker   Resting Pulse Ox/HR 100% and 55/min 100% and 57/min 100% and 59/min 100% and 61/min 97% and 56/min 98% and 65/min  92% ra at rest 93% RA at restm ?HR 68   Final Pulse Ox/HR 97% and 78/min 97% and 77/mi 98% and 76/min 95% and 91/min 91% and 87/min 92% and 94/mio   84% - half way of firstr laps, HR 84   Desaturated </= 88% _8  no      Desaturated <= 3% points yes yes no Y84es, 5 points Yes, 6 points Yes, 6 point      Got Tachycardic >/= 90/min no no no yes no yes      Symptoms at end of test No, denied dyspnea x none none none veryt dyspneic      Miscellaneous comments npne x x               PFT  PFT Results Latest Ref Rng & Units 08/27/2019 10/03/2018 08/16/2018 04/11/2018  FVC-Pre L 2.18 2.42 2.53 2.57  FVC-Predicted Pre % 56 65 67 63  Pre  FEV1/FVC % % 82 91 89 89  FEV1-Pre L 1.78 2.22 2.26 2.29  FEV1-Predicted Pre % 62 80 81 76  DLCO uncorrected ml/min/mmHg 13.67 - - 16.68  DLCO UNC% % 59 - - 58  DLVA Predicted % 97 - - 106       has a past medical history of Abnormality of gait (01/21/2014), Allergy, Arthritis, ASVD (arteriosclerotic vascular disease) (02/06/2018), Benign paroxysmal positional vertigo (01/21/2014), Chronic respiratory failure with hypoxia (HCC) (02/13/2020), Dizziness (03/14/2017), GERD (gastroesophageal reflux disease), Heart murmur, Hiatal hernia, Hypercholesteremia (02/05/2018), Hyperlipidemia, Hypertension, Idiopathic pulmonary  fibrosis ([place]) (09/11/2018), Obesity (02/05/2018), Overweight (09/11/2018), Post-traumatic headache (01/21/2014), Research study patient (11/21/2019), Sleep apnea, and Vertigo.   reports that he has never smoked. He has never used smokeless tobacco.  Past Surgical History:  Procedure Laterality Date  . CARDIAC CATHETERIZATION  03/22/2001  . LEG SURGERY Bilateral 04/2001    Allergies  Allergen Reactions  . Ciprofloxacin Other (See Comments)    colitis  . Codeine     Immunization History  Administered Date(s) Administered  . Fluad Quad(high Dose 65+) 06/30/2019  . Influenza, High Dose Seasonal PF 08/28/2017, 07/19/2018, 07/22/2020  . PFIZER(Purple Top)SARS-COV-2 Vaccination 11/16/2019, 12/06/2019, 08/12/2020  . Pneumococcal Conjugate-13 08/25/2016  . Pneumococcal Polysaccharide-23 11/18/2007    Family History  Problem Relation Age of Onset  . Dementia Mother   . Heart attack Father   . Aneurysm Father   . Cancer Maternal Grandmother   . Aneurysm Maternal Grandmother      Current Outpatient Medications:  .  amitriptyline (ELAVIL) 75 MG tablet, Take 75 mg by mouth at bedtime., Disp: , Rfl:  .  amLODipine (NORVASC) 10 MG tablet, Take 10 mg by mouth daily., Disp: , Rfl:  .  aspirin EC 81 MG tablet, Take 81 mg by mouth daily., Disp: , Rfl:  .  calcium carbonate (OSCAL) 1500 (600 Ca) MG TABS tablet, Take by mouth 2 (two) times daily with a meal., Disp: , Rfl:  .  calcium carbonate (TUMS - DOSED IN MG ELEMENTAL CALCIUM) 500 MG chewable tablet, Chew 1 tablet by mouth daily., Disp: , Rfl:  .  cholecalciferol (VITAMIN D) 1000 units tablet, Take 1,000 Units by mouth daily., Disp: , Rfl:  .  cloNIDine (CATAPRES) 0.3 MG tablet, Take 0.3 mg by mouth daily after supper. , Disp: , Rfl:  .  dicyclomine (BENTYL) 10 MG capsule, Take 10 mg by mouth 3 (three) times daily before meals. , Disp: , Rfl:  .  ESBRIET 267 MG CAPS, Take 3 capsules three times a day with food., Disp: 270 capsule, Rfl: 11 .   gabapentin (NEURONTIN) 300 MG capsule, TAKE ONE CAPSULE BY MOUTH THREE TIMES DAILY, Disp: 90 capsule, Rfl: 5 .  guaiFENesin-codeine 100-10 MG/5ML syrup, Take 5 mLs by mouth 2 (two) times daily as needed for cough., Disp: 450 mL, Rfl: 0 .  HYDROcodone bit-homatropine (HYCODAN) 5-1.5 MG/5ML syrup, Take 5 mLs by mouth 3 (three) times daily as needed for cough., Disp: 450 mL, Rfl: 0 .  lansoprazole (PREVACID) 30 MG capsule, Take 30 mg by mouth daily at 12 noon., Disp: , Rfl:  .  Methylcellulose, Laxative, (CITRUCEL PO), Take 1 Scoop by mouth., Disp: , Rfl:  .  metoprolol tartrate (LOPRESSOR) 50 MG tablet, Take 1 tablet by mouth 2 (two) times daily., Disp: , Rfl:  .  montelukast (SINGULAIR) 10 MG tablet, Take 10 mg by mouth at bedtime., Disp: , Rfl:  .  Multiple Vitamin (MULTIVITAMIN WITH MINERALS) TABS tablet, Take  1 tablet by mouth daily., Disp: , Rfl:  .  nabumetone (RELAFEN) 750 MG tablet, Take 750 mg by mouth 2 (two) times daily., Disp: , Rfl:  .  olmesartan (BENICAR) 20 MG tablet, Take 1 tablet by mouth daily., Disp: , Rfl:  .  polyethylene glycol (MIRALAX / GLYCOLAX) packet, Take 17 g by mouth daily., Disp: , Rfl:  .  predniSONE (DELTASONE) 10 MG tablet, Take 4 tablets (40 mg total) by mouth daily with breakfast for 2 days, THEN 2 tablets (20 mg total) daily with breakfast for 2 days, THEN 1 tablet (10 mg total) daily with breakfast for 2 days, THEN 0.5 tablets (5 mg total) daily with breakfast for 2 days., Disp: 20 tablet, Rfl: 0 .  Probiotic Product (PROBIOTIC DAILY PO), Take 1 capsule by mouth daily., Disp: , Rfl:  .  rosuvastatin (CRESTOR) 10 MG tablet, Take 10 mg by mouth daily., Disp: , Rfl:  .  sodium chloride 1 g tablet, Take 3 g by mouth 3 (three) times daily. , Disp: , Rfl:  .  spironolactone (ALDACTONE) 25 MG tablet, Take 25 mg by mouth 3 (three) times a week., Disp: , Rfl:  .  UNABLE TO FIND, Take 1 capsule by mouth 3 (three) times daily. Med Name: blood sugar harmony, Disp: , Rfl:  .   vitamin B-12 (CYANOCOBALAMIN) 1000 MCG tablet, Take 1,000 mcg by mouth daily., Disp: , Rfl:  .  vitamin E 400 UNIT capsule, Take 400 Units by mouth daily., Disp: , Rfl:  .  sodium chloride HYPERTONIC 3 % nebulizer solution, 3 ml via neb twice daily (Patient not taking: Reported on 03/23/2021), Disp: 180 mL, Rfl: 5      Objective:   Vitals:   03/23/21 1040  BP: 126/74  Pulse: 70  Temp: (!) 97.3 F (36.3 C)  TempSrc: Temporal  SpO2: 92%  Weight: 233 lb 12.8 oz (106.1 kg)  Height: _0  (1.676 m)    Estimated body mass index is 37.74 kg/m as calculated from the following:   Height as of this encounter: _1  (1.676 m).   Weight as of this encounter: 233 lb 12.8 oz (106.1 kg).  _2 @  [employer]   03/23/21 1040  Weight: 233 lb 12.8 oz (106.1 kg)     Physical Exam Obese male.  .  Has oxygen on.  His basal crackles.  He has dry barking laryngeal cough periodically.  Normal heart sounds no sinus no clubbing no edema.  He has a cane.      Assessment:       ICD-10-CM   1. IPF (idiopathic pulmonary fibrosis) (HCC)  J84.112 Hepatic function panel  2. Chronic cough  R05.3 Hepatic function panel  3. Therapeutic drug monitoring  Z51.81 Hepatic function panel  4. Chronic respiratory failure with hypoxia (HCC)  J96.11 Hepatic function panel  5. Personal history of COVID-19  Z86.16 Hepatic function panel       Plan:     Patient Instructions     ICD-10-CM   1. IPF (idiopathic pulmonary fibrosis) ([place])  J84.112   2. Chronic cough  R05.3   3. Therapeutic drug monitoring  Z51.81   4. Chronic respiratory failure with hypoxia (HCC)  J96.11   5. Personal history of COVID-19  Z86.16      Pulmonary fibrosis  Might be getiting worse post covid siunce dec 2021 but stable since last visit  - as evidenced by worsening cough (could also be lack of med effect) and easier  tendency to desaturate Glad you are managing on 3 L nasal cannula 24/7 and esbriet without issues But  cough continues to be a problem  And destroying your quality of life  - now you are off opioid cough syrup due to backorder and also having technical issue getting escript  -  Plan - continue esbriet for IPF  -Continue Delsym  - continue o2 3 L nasal cannula as before  - no more PFT testing due to cough atlest till cough improves -Refill Hycodan and codeine/guaifenesisn = respect not wanting to do daily prednisone but ok to do Take prednisone 40 mg daily x 2 days, then 50m daily x 2 days, then 1579mdaily x 2 days, then 79m279maily x 2 days and stop 0- continue  3% saline  -Cough research trial for pulmonary fibrosis patient's COMFORT study when study is approved  - per [facility] still a few months away - can consider nebulzied lidoacine daily - will talk to pur pharmacist  Followup -6-8 weeks with Dr RamChase Callerr face to face visit in 30 min slot  = ILD symptom score and walk test at followup   ( Level 05 visit: Estb 40-54 min   in  visit type: on-site physical face to visit  in total care time and counseling or/and coordination of care by this undersigned MD - Dr MurBrand Maleshis includes one or more of the following on this same day 03/23/2021: pre-charting, chart review, note writing, documentation discussion of test results, diagnostic or treatment recommendations, prognosis, risks and benefits of management options, instructions, education, compliance or risk-factor reduction. It excludes time spent by the [facility] office staff in the care of the patient. Actual time 40 min)   SIGNATURE    Dr. MurBrand Males.D., F.C.C.P,  Pulmonary and Critical Care Medicine Staff Physician, [facility]unrector - Interstitial Lung Disease  Program  Pulmonary FibAccomack LebAgencyC,[id number]: [phone] no answer or between  15:00h - 7:00h: call [phone] Telephone: [phone]  11:26  AM 03/23/2021

## 2021-03-23 NOTE — Patient Instructions (Addendum)
ICD-10-CM   1. IPF (idiopathic pulmonary fibrosis) ([place])  J84.112   2. Chronic cough  R05.3   3. Therapeutic drug monitoring  Z51.81   4. Chronic respiratory failure with hypoxia (HCC)  J96.11   5. Personal history of COVID-19  Z86.16      Pulmonary fibrosis  Might be getiting worse post covid siunce dec 2021 but stable since last visit  - as evidenced by worsening cough (could also be lack of med effect) and easier tendency to desaturate Glad you are managing on 3 L nasal cannula 24/7 and esbriet without issues But cough continues to be a problem  And destroying your quality of life  - now you are off opioid cough syrup due to backorder and also having technical issue getting escript  -  Plan - continue esbriet for IPF  -Continue Delsym  - continue o2 3 L nasal cannula as before  - no more PFT testing due to cough atlest till cough improves -Refill Hycodan and codeine/guaifenesisn = respect not wanting to do daily prednisone but ok to do Take prednisone 40 mg daily x 2 days, then 39m daily x 2 days, then 1149mdaily x 2 days, then 49m67maily x 2 days and stop 0- continue  3% saline  -Cough research trial for pulmonary fibrosis patient's COMFORT study when study is approved  - per [facility] still a few months away - can consider nebulzied lidoacine daily - will talk to pur pharmacist  Followup -6-8 weeks with Dr RamChase Callerr face to face visit in 30 min slot  = ILD symptom score and walk test at followup

## 2021-03-23 NOTE — Telephone Encounter (Signed)
devki  I have seen nebulized lidocaine given for real bad cough with IPF . Would you kindly be able to look into this and see how we can prescribe it for him? Dosing and frequency and anythiung in literature?  THanks  MR

## 2021-03-24 DIAGNOSIS — G4733 Obstructive sleep apnea (adult) (pediatric): Secondary | ICD-10-CM | POA: Diagnosis not present

## 2021-03-24 DIAGNOSIS — J84112 Idiopathic pulmonary fibrosis: Secondary | ICD-10-CM | POA: Diagnosis not present

## 2021-03-24 NOTE — Telephone Encounter (Signed)
Called pt and spoke with pt's spouse letting her know the results of pt's labwork. Also asked her if pt was able to pick up cough med that was prescribed and she said that she did pick it up today 5/26. Nothing further needed.

## 2021-03-25 DIAGNOSIS — M545 Low back pain, unspecified: Secondary | ICD-10-CM | POA: Diagnosis not present

## 2021-03-25 DIAGNOSIS — M2560 Stiffness of unspecified joint, not elsewhere classified: Secondary | ICD-10-CM | POA: Diagnosis not present

## 2021-03-25 DIAGNOSIS — M25551 Pain in right hip: Secondary | ICD-10-CM | POA: Diagnosis not present

## 2021-03-25 DIAGNOSIS — R531 Weakness: Secondary | ICD-10-CM | POA: Diagnosis not present

## 2021-03-30 DIAGNOSIS — M2560 Stiffness of unspecified joint, not elsewhere classified: Secondary | ICD-10-CM | POA: Diagnosis not present

## 2021-03-30 DIAGNOSIS — M25551 Pain in right hip: Secondary | ICD-10-CM | POA: Diagnosis not present

## 2021-03-30 DIAGNOSIS — M545 Low back pain, unspecified: Secondary | ICD-10-CM | POA: Diagnosis not present

## 2021-03-30 DIAGNOSIS — R531 Weakness: Secondary | ICD-10-CM | POA: Diagnosis not present

## 2021-04-01 DIAGNOSIS — M545 Low back pain, unspecified: Secondary | ICD-10-CM | POA: Diagnosis not present

## 2021-04-01 DIAGNOSIS — M25551 Pain in right hip: Secondary | ICD-10-CM | POA: Diagnosis not present

## 2021-04-01 DIAGNOSIS — M2560 Stiffness of unspecified joint, not elsewhere classified: Secondary | ICD-10-CM | POA: Diagnosis not present

## 2021-04-01 DIAGNOSIS — R531 Weakness: Secondary | ICD-10-CM | POA: Diagnosis not present

## 2021-04-06 DIAGNOSIS — M5459 Other low back pain: Secondary | ICD-10-CM | POA: Diagnosis not present

## 2021-04-11 ENCOUNTER — Other Ambulatory Visit: Payer: Self-pay

## 2021-04-13 ENCOUNTER — Ambulatory Visit: Payer: PPO | Admitting: Cardiology

## 2021-04-13 ENCOUNTER — Other Ambulatory Visit: Payer: Self-pay

## 2021-04-13 ENCOUNTER — Encounter: Payer: Self-pay | Admitting: Cardiology

## 2021-04-13 VITALS — BP 144/78 | HR 80 | Ht 66.0 in | Wt 237.8 lb

## 2021-04-13 DIAGNOSIS — I2510 Atherosclerotic heart disease of native coronary artery without angina pectoris: Secondary | ICD-10-CM | POA: Insufficient documentation

## 2021-04-13 DIAGNOSIS — I2584 Coronary atherosclerosis due to calcified coronary lesion: Secondary | ICD-10-CM | POA: Diagnosis not present

## 2021-04-13 DIAGNOSIS — I700 Atherosclerosis of aorta: Secondary | ICD-10-CM | POA: Diagnosis not present

## 2021-04-13 DIAGNOSIS — I7090 Unspecified atherosclerosis: Secondary | ICD-10-CM | POA: Diagnosis not present

## 2021-04-13 DIAGNOSIS — I10 Essential (primary) hypertension: Secondary | ICD-10-CM | POA: Diagnosis not present

## 2021-04-13 DIAGNOSIS — E782 Mixed hyperlipidemia: Secondary | ICD-10-CM

## 2021-04-13 NOTE — Patient Instructions (Signed)
Medication Instructions:  No medication changes. *If you need a refill on your cardiac medications before your next appointment, please call your pharmacy*   Lab Work: None ordered If you have labs (blood work) drawn today and your tests are completely normal, you will receive your results only by: [place] (if you have MyChart) OR A paper copy in the mail If you have any lab test that is abnormal or we need to change your treatment, we will call you to review the results.   Testing/Procedures: None ordered   Follow-Up: At [facility], you and your health needs are our priority.  As part of our continuing mission to provide you with exceptional heart care, we have created designated Provider Care Teams.  These Care Teams include your primary Cardiologist (physician) and Advanced Practice Providers (APPs -  Physician Assistants and Nurse Practitioners) who all work together to provide you with the care you need, when you need it.  We recommend signing up for the patient portal called "MyChart".  Sign up information is provided on this After Visit Summary.  MyChart is used to connect with patients for Virtual Visits (Telemedicine).  Patients are able to view lab/test results, encounter notes, upcoming appointments, etc.  Non-urgent messages can be sent to your provider as well.   To learn more about what you can do with MyChart, go to [email or web address].    Your next appointment:   6 month(s)  The format for your next appointment:   In Person  Provider:   Jyl Heinz, MD   Other Instructions NA

## 2021-04-13 NOTE — Progress Notes (Signed)
Cardiology Office Note:    Date:  04/13/2021   ID:  Stephen [name], DOB 03/21/1951, MRN [id number]  PCP:  Ronita Hipps, MD  Cardiologist:  Jenean Lindau, MD   Referring MD: Ronita Hipps, MD    ASSESSMENT:    1. ASVD (arteriosclerotic vascular disease)   2. Abdominal aortic atherosclerosis ([place])   3. Mixed hyperlipidemia   4. Primary hypertension   5. Coronary artery calcification    PLAN:    In order of problems listed above:  Coronary atherosclerosis: Atherosclerotic vascular disease: Secondary prevention stressed with the patient.  Importance of compliance with diet medication stressed any vocalized understanding. Essential hypertension: Blood pressure stable and diet was emphasized. Mixed dyslipidemia: Lipids were reviewed from the past he has had blood work recently.  He has not had a lipid check according to my reports and will talk to his primary care about these tests when he sees her next. Obesity: Diet was suggested weight reduction was stressed and he promises to do better. Pulmonary fibrosis with patient being supplemental oxygen dependent: Followed by his pulmonologist. Patient will be seen in follow-up appointment in 6 months or earlier if the patient has any concerns    Medication Adjustments/Labs and Tests Ordered: Current medicines are reviewed at length with the patient today.  Concerns regarding medicines are outlined above.  No orders of the defined types were placed in this encounter.  No orders of the defined types were placed in this encounter.    No chief complaint on file.    History of Present Illness:    Stephen [name] is a 70 y.o. male.  Patient has past medical history of atherosclerotic vascular disease, essential hypertension mixed dyslipidemia idiopathic pulm fibrosis on oxygen and morbid obesity.  He denies any problems at this time and takes care of activities of daily living.  No chest pain orthopnea or PND.  He has had a fall and has  had back pain and is following with his doctors for the same.  At the time of my evaluation, the patient is alert awake oriented and in no distress.  Past Medical History:  Diagnosis Date   Abdominal aortic atherosclerosis ([place]) 03/16/2021   Abnormality of gait 01/21/2014   Allergy    Arthritis    ASVD (arteriosclerotic vascular disease) 02/06/2018   Benign paroxysmal positional vertigo 01/21/2014   Chronic respiratory failure with hypoxia ([place]) 02/13/2020   Dizziness 03/14/2017   GERD (gastroesophageal reflux disease)    Heart murmur    Hiatal hernia    Hypercholesteremia 02/05/2018   Hyperlipidemia    Hypertension    Idiopathic pulmonary fibrosis ([place]) 09/11/2018   12/31/2018-CT chest high-res- interval progression of basilar predominant fibrotic interstitial lung disease without frank honeycombing, categorized as probable UIP  10/03/2018-pulmonary function test-spirometry-FVC 2.42 (65% predicted), ratio 91, FEV1 2.22 (80% predicted) >>>Patient attempted to complete DLCO today was unable to complete DLCO due to severe coughing  03/05/2018- [facility]   Morbid obesity ([place]) 10/13/2020   Obesity 02/05/2018   Overweight 09/11/2018   Pain in joint of right hip 03/15/2021   Post-traumatic headache 01/21/2014   Research study patient 11/21/2019   Sleep apnea    Vertigo     Past Surgical History:  Procedure Laterality Date   CARDIAC CATHETERIZATION  03/22/2001   LEG SURGERY Bilateral 04/2001    Current Medications: Current Meds  Medication Sig   amitriptyline (ELAVIL) 75 MG tablet Take 75 mg by mouth at bedtime.  amLODipine (NORVASC) 10 MG tablet Take 10 mg by mouth daily.   aspirin EC 81 MG tablet Take 81 mg by mouth daily.   calcium carbonate (OSCAL) 1500 (600 Ca) MG TABS tablet Take 1 tablet by mouth 2 (two) times daily with a meal.   calcium carbonate (TUMS - DOSED IN MG ELEMENTAL CALCIUM) 500 MG chewable tablet Chew 1 tablet by mouth daily.   cholecalciferol (VITAMIN D) 1000  units tablet Take 1,000 Units by mouth daily.   cloNIDine (CATAPRES) 0.3 MG tablet Take 0.3 mg by mouth daily after supper.    dicyclomine (BENTYL) 10 MG capsule Take 10 mg by mouth 3 (three) times daily before meals.    ESBRIET 267 MG CAPS Take 3 capsules three times a day with food.   gabapentin (NEURONTIN) 300 MG capsule Take 300 mg by mouth 3 (three) times daily.   ketoconazole (NIZORAL) 2 % shampoo Apply 1 application topically 2 (two) times a week.   lansoprazole (PREVACID) 30 MG capsule Take 30 mg by mouth daily at 12 noon.   Methylcellulose, Laxative, (CITRUCEL PO) Take 1 Scoop by mouth daily.   metoprolol tartrate (LOPRESSOR) 50 MG tablet Take 1 tablet by mouth 2 (two) times daily.   mometasone (NASONEX) 50 MCG/ACT nasal spray Place 2 sprays into the nose as needed (allergies).   montelukast (SINGULAIR) 10 MG tablet Take 10 mg by mouth at bedtime.   Multiple Vitamin (MULTIVITAMIN WITH MINERALS) TABS tablet Take 1 tablet by mouth daily.   nabumetone (RELAFEN) 750 MG tablet Take 750 mg by mouth 2 (two) times daily.   olmesartan (BENICAR) 20 MG tablet Take 1 tablet by mouth daily.   polyethylene glycol (MIRALAX / GLYCOLAX) packet Take 17 g by mouth daily.   Probiotic Product (PROBIOTIC DAILY PO) Take 1 capsule by mouth daily.   rosuvastatin (CRESTOR) 10 MG tablet Take 10 mg by mouth daily.   spironolactone (ALDACTONE) 25 MG tablet Take 25 mg by mouth 3 (three) times a week.   UNABLE TO FIND Take 1 capsule by mouth 3 (three) times daily. Med Name: blood sugar harmony   vitamin B-12 (CYANOCOBALAMIN) 1000 MCG tablet Take 1,000 mcg by mouth daily.   vitamin E 400 UNIT capsule Take 400 Units by mouth daily.     Allergies:   Ciprofloxacin and Codeine   Social History   Socioeconomic History   Marital status: Married    Spouse name: Not on file   Number of children: Not on file   Years of education: Not on file   Highest education level: Not on file  Occupational History   Not on  file  Tobacco Use   Smoking status: Never   Smokeless tobacco: Never  Vaping Use   Vaping Use: Never used  Substance and Sexual Activity   Alcohol use: Not on file   Drug use: Not on file   Sexual activity: Not on file  Other Topics Concern   Not on file  Social History Narrative   Not on file   Social Determinants of Health   Financial Resource Strain: Not on file  Food Insecurity: Not on file  Transportation Needs: Not on file  Physical Activity: Not on file  Stress: Not on file  Social Connections: Not on file     Family History: The patient's family history includes Aneurysm in his father and maternal grandmother; Cancer in his maternal grandmother; Dementia in his mother; Heart attack in his father.  ROS:   Please see the  history of present illness.    All other systems reviewed and are negative.  EKGs/Labs/Other Studies Reviewed:    The following studies were reviewed today: I discussed my findings with the patient at length.   Recent Labs: 03/23/2021: ALT 14  Recent Lipid Panel No results found for: CHOL, TRIG, HDL, CHOLHDL, VLDL, LDLCALC, LDLDIRECT  Physical Exam:    VS:  BP (!) 144/78   Pulse 80   Ht _0  (1.676 m)   Wt 237 lb 12.8 oz (107.9 kg)   BMI 38.38 kg/m     Wt Readings from Last 3 Encounters:  04/13/21 237 lb 12.8 oz (107.9 kg)  03/23/21 233 lb 12.8 oz (106.1 kg)  02/17/21 236 lb 6.4 oz (107.2 kg)     GEN: Patient is in no acute distress HEENT: Normal NECK: No JVD; No carotid bruits LYMPHATICS: No lymphadenopathy CARDIAC: Hear sounds regular, 2/6 systolic murmur at the apex. RESPIRATORY:  Clear to auscultation without rales, wheezing or rhonchi  ABDOMEN: Soft, non-tender, non-distended MUSCULOSKELETAL:  No edema; No deformity  SKIN: Warm and dry NEUROLOGIC:  Alert and oriented x 3 PSYCHIATRIC:  Normal affect   Signed, Jenean Lindau, MD  04/13/2021 1:10 PM    [facility]

## 2021-04-24 DIAGNOSIS — J84112 Idiopathic pulmonary fibrosis: Secondary | ICD-10-CM | POA: Diagnosis not present

## 2021-04-24 DIAGNOSIS — G4733 Obstructive sleep apnea (adult) (pediatric): Secondary | ICD-10-CM | POA: Diagnosis not present

## 2021-04-28 DIAGNOSIS — I10 Essential (primary) hypertension: Secondary | ICD-10-CM | POA: Diagnosis not present

## 2021-04-28 DIAGNOSIS — E785 Hyperlipidemia, unspecified: Secondary | ICD-10-CM | POA: Diagnosis not present

## 2021-04-28 DIAGNOSIS — M5416 Radiculopathy, lumbar region: Secondary | ICD-10-CM | POA: Diagnosis not present

## 2021-05-09 DIAGNOSIS — Z9981 Dependence on supplemental oxygen: Secondary | ICD-10-CM | POA: Diagnosis not present

## 2021-05-09 DIAGNOSIS — M5416 Radiculopathy, lumbar region: Secondary | ICD-10-CM | POA: Diagnosis not present

## 2021-05-17 ENCOUNTER — Other Ambulatory Visit: Payer: Self-pay | Admitting: Internal Medicine

## 2021-05-18 NOTE — Telephone Encounter (Signed)
Patient requesting refill on Gabapentin 300 mg. Listed in chart as historical provider. Patient was last seen in office on 03/23/21. Is it ok to refill under you ?  MR please advise  Thank you

## 2021-05-20 DIAGNOSIS — M5416 Radiculopathy, lumbar region: Secondary | ICD-10-CM | POA: Diagnosis not present

## 2021-05-29 DIAGNOSIS — I10 Essential (primary) hypertension: Secondary | ICD-10-CM | POA: Diagnosis not present

## 2021-05-29 DIAGNOSIS — E785 Hyperlipidemia, unspecified: Secondary | ICD-10-CM | POA: Diagnosis not present

## 2021-06-03 DIAGNOSIS — M5416 Radiculopathy, lumbar region: Secondary | ICD-10-CM | POA: Diagnosis not present

## 2021-06-13 ENCOUNTER — Telehealth: Payer: Self-pay | Admitting: Internal Medicine

## 2021-06-13 ENCOUNTER — Other Ambulatory Visit (HOSPITAL_COMMUNITY): Payer: Self-pay

## 2021-06-13 DIAGNOSIS — Z5181 Encounter for therapeutic drug level monitoring: Secondary | ICD-10-CM

## 2021-06-13 DIAGNOSIS — J84112 Idiopathic pulmonary fibrosis: Secondary | ICD-10-CM

## 2021-06-13 NOTE — Telephone Encounter (Signed)
Very sparse dosing recommendations available, but it seems as if there are two options:  2% lidocaine Vial/syringe:  2% lidocaine vial rx can be used with syringe rx. Lidocaine has to be drawn up by patient from vial (if vial does not have tear away top) and medication has to be squirted from syringe into nebulizer cup or poured directly into cup if vial top comes off.    Source: How to Use Nebulized Lidocaine to manage your cough 4% Topical lidocaine solution:  Dosing based on RCT from 2004 in Journal of Allergy and Clinical Immun used 4% lidocaine and dose of 18m four times daily. Produced peak of 1.180m/mL (in a 70kg patient) which is well below toxicity range.  For ease of use, would consider 5 mL of 4% topical lidocaine solution via neb before using vial. This is 200 mg per dose. Can repeat up to three times daily and still be within safety margin.  DeKnox SalivaPharmD, MPH, BCPS Clinical Pharmacist (Rheumatology and Pulmonology)

## 2021-06-14 ENCOUNTER — Other Ambulatory Visit (HOSPITAL_COMMUNITY): Payer: Self-pay

## 2021-06-14 MED ORDER — PIRFENIDONE 801 MG PO TABS: 801.0000 mg | ORAL_TABLET | Freq: Three times a day (TID) | ORAL | 5 refills | Status: AC

## 2021-06-14 NOTE — Telephone Encounter (Signed)
Rx for Esbriet 801 mg three times daily sent to [facility]. Spoke with patient who states he would be interested in reducing pill burden. He is aware that the dose will be 1 tab three times daily and that formulation will change to tablet from capsule.  He verbalized understanding.  He has f/u appointment with Dr. Chase Caller on 06/16/21 and can have hepatic function panel.  Knox Saliva, PharmD, MPH, BCPS Clinical Pharmacist (Rheumatology and Pulmonology)

## 2021-06-16 ENCOUNTER — Ambulatory Visit: Payer: PPO | Admitting: Internal Medicine

## 2021-06-16 ENCOUNTER — Other Ambulatory Visit: Payer: Self-pay

## 2021-06-16 ENCOUNTER — Encounter: Payer: Self-pay | Admitting: Internal Medicine

## 2021-06-16 DIAGNOSIS — Z5181 Encounter for therapeutic drug level monitoring: Secondary | ICD-10-CM

## 2021-06-16 DIAGNOSIS — J84112 Idiopathic pulmonary fibrosis: Secondary | ICD-10-CM | POA: Diagnosis not present

## 2021-06-16 LAB — HEPATIC FUNCTION PANEL
ALT: 14 U/L (ref 0–53)
AST: 21 U/L (ref 0–37)
Albumin: 4 g/dL (ref 3.5–5.2)
Alkaline Phosphatase: 82 U/L (ref 39–117)
Bilirubin, Direct: 0 mg/dL (ref 0.0–0.3)
Total Bilirubin: 0.3 mg/dL (ref 0.2–1.2)
Total Protein: 7.8 g/dL (ref 6.0–8.3)

## 2021-06-16 MED ORDER — GUAIFENESIN-CODEINE 100-10 MG/5ML PO SOLN: 5.0000 mL | Freq: Two times a day (BID) | ORAL | 0 refills | Status: DC | PRN

## 2021-06-16 MED ORDER — SODIUM CHLORIDE 3 % IN NEBU: INHALATION_SOLUTION | RESPIRATORY_TRACT | 12 refills | Status: DC | PRN

## 2021-06-16 MED ORDER — PREDNISONE 10 MG PO TABS: ORAL_TABLET | ORAL | 5 refills | Status: AC

## 2021-06-16 NOTE — Patient Instructions (Addendum)
ICD-10-CM   1. IPF (idiopathic pulmonary fibrosis) ([place])  J84.112   2. Chronic cough  R05.3   3. Therapeutic drug monitoring  Z51.81   4. Chronic respiratory failure with hypoxia ([facility])  J96.11   5. Personal history of COVID-19  Z86.16     Pulmonary fibrosis  getiting worse slowly with based on 4L [place] use (last year 3), increased cough, easy desaturations  Tolerating esbriet well  Cough continues to be a problem  and destroying your quality of life despite gabapentin and opioid cough syrup  No problem with short course prednisone last time  Plan - check LFT 06/16/2021 - continue esbriet for IPF  -Continue Delsym  - continue o2 4 L nasal cannula as before but adjust to keep pulse ox > 88%  - no more PFT testing due to cough atlest till cough improves -Refill Hycodan and codeine/guaifenesisn - Start long term prednisone (based on shared decision making)  -   Take prednisone 40 mg daily x 5 days, then 22m daily x 5 days, then 240mdaily x 5 days and then 1031maily  to continue - continue 3% saline neb as needed  - can consider nebulzied lidoacine daily in the future - will talk to pur pharmacist - re-refer ENT  Followup - 3 weeks telephone visit with app or DR RamChase Caller-8 weeks with Dr RamChase Callerr face to face visit in 30 min slot - = ILD symptom score at followup

## 2021-06-16 NOTE — Progress Notes (Signed)
IOV 03/05/2018  Chief Complaint  Patient presents with   Consult    Referral per MD Helene Kelp, Dec 2018 had sinus infection and still has cough, hx of mild bronchiectasis, later was given symbicort which patient believes is making him more SOB.      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  He lives in [facility] and presents with his wife for evaluation of pulmonary fibrosis to the ILD clinic.  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  During this time he was not exposed to any metal dust but he was exposed to mold and damp environments intermittently but not always.  He denies any mold or mildew in the house.  He tells me that in December 2018 he had a sinus infection and since then has a lingering cough that never really went away.  It looks like from the primary care physician he saw dentist and from there he had imaging and this finally all resulted in a high-resolution CT chest January 21, 2018 that I personally visualized.  The official report is that it is indeterminate for UIP.  In my personal visualization there is bilateral bibasal symmetric disease with definite of craniocaudal gradient.  The septal thickening and also cylindrical bronchiectasis and peripheral bronchiec bronchiolectasis.  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  He does not have much of her shortness of breath but then he is morbidly obese and has balance issues and is quite limited.  He was seen by a local pulmonologist will give him a 2-5-year survival rate and therefore he is frustrated.  He was started on Symbicort which actually made things worse for him.  [place] pulmonary interstitial lung disease questionnaire  Symptoms: Since dec 2019. Some sputjm +. Better since started. Insidious dyspnea x 4 months. Same since onset. No episodic  dyspnea. lLeve 4 dyspnea walking wiuth peers and level 5 walking up hill/stairs. Level 1 for dressing, Level 2 fo rshopping and mpwing lawn  ROS: chronic balance issues affecting mobiluity.  In association with this he has fatigue for the last 3 years.  He has dry mouth for the last several years associated with some dysphagia for the last 10 years heartburn and acid reflux for several years.  Daytime set sleepiness for the last several years but denies any oral ulcers or rash or weight loss or recurrent fever or arthralgia  Past medical history: Denies any asthma COPD or heart failure rheumatoid arthritis or scleroderma or systemic lupus erythematosus or polymyositis or Sjogren's but is positive for sleep apnea and hiatal hernia.  Negative for pulmonary hypertension diabetes thyroid disease stroke seizures hepatitis tuberculosis kidney disease blood clots heart disease other than murmur pleurisy  Family history of pulmonary disease: Positive for asthma in the mother but nobody with pulmonary fibrosis  Personal exposure history: He has never smoked cigarettes did smoke cigars for 3 years.  [place]  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  During this time there is intermittent mold  exposure  Pulmonary toxicity history denies a laundry list of pulmonary toxicity medications    OV 04/11/2018  Chief Complaint  Patient presents with   Follow-up    PFT performed today.  Pt is still coughing with occ. white mucus and pt also states he believes SOB is worse and is becoming SOB more easily.    Follow-up interstitial lung disease work-up  Symptoms: In the interim no new symptoms.  He does admit to having chronic  arthralgia for many decades after sheetrock fell on him.  He also has joint stiffness especially in the hands but he does not know if it is early morning stiffness.  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 Lorin Picket.  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  His autoimmune and vascular does panel shows trace positivity for ANCA and cyclic citrulline peptide.    OV 06/04/2018   Chief Complaint  Patient presents with   Follow-up    Pt states things have been about the same since last visit.   FU ILD - ccp and atypical p-anca trace positive May 2019. HP panel negative.  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 Lung function: Severity -FVC 63% and DLCO 58% showing moderate ILD. Rheum evaluation - 04/25/18 - no evidence of RA (Dr Trudie Reed)  Here with his wife to discuss test results. In the interim he saw Dr. Gavin Pound on 04/25/2018. She is of rheumatology. Clinically she did not find any evidence of systemic rheumatoid arthritis or vasculitis. Therefore he is here for decision making regarding his ILD. In the interim there are no new issues.  OV 07/16/2018  Subjective:  Patient ID: Stephen [name], male , DOB: 1951-08-18 , age 70 y.o. , MRN: [id number] , ADDRESS: [address] [place] [address]   07/16/2018 -   Chief Complaint  Patient presents with   Follow-up    Pt started [facility] 8/11 and so far denies any complaints.    FU ILD - ccp and atypical p-anca trace positive May 2019. HP panel negative.  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 Lung function: Severity -FVC 63% and DLCO 58% showing moderate ILD. Rheum evaluation - 04/25/18 - no evidence of RA (Dr Trudie Reed). Dx of IPF given 06/04/18. STarted ofev 8/11/9    HPI Stephen [name] 70 y.o. - presents for follow-up of his idiopathic pulmonary fibrosis. Diagnoses given 06/04/2018. He is started on Ofev  06/09/2018. He is here with his wife. He says he is tolerating the Ofev just fine. No GI issues. He gets support from the Child psychotherapist Clarene Critchley. He will have a liver function test today. He deferred flu shot because he will have it with his primary care physician. He told me that he is planning to join the pulmonary fibrosis foundation patient support group. Next meeting is 07/25/2018 Thursday 6 PM at [facility]. He is also willing to join research trials.He has mobility issues on account of his obesity and cane use. He says he cannot do a treadmill or walk long distances.   He has chronic tinnitus and apparently this is from working in [employer] base during [place] War although he was never deployed overseas. He started getting VA eligibility. He asked [place] to fill forms. ROS - per HPI  OV 09/03/2018  Subjective:  Patient ID:  Stephen [name], male , DOB: Feb 27, 1951 , age 34 y.o. , MRN: [id number] , ADDRESS: [address] [place] [address]   09/03/2018 -   Chief Complaint  Patient presents with   Follow-up    Doing well at this time coughing up white mucus     FU ILD - ccp and atypical p-anca trace positive May 2019. HP panel negative.  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 Lung function: Severity -FVC 63% and DLCO 58% showing moderate ILD. Rheum evaluation - 04/25/18 - no evidence of RA (Dr Trudie Reed). Dx of IPF given 06/04/18. STarted ofev 8/11/9   HPI Stephen [name] 13 y.o. -returns for follow-up of his IPF.  He has been on nintedanib since mid August 2019.  He is here with his wife.  So far is tolerating the nintedanib just fine.  He says that his baseline constipation still continues and he needs both MiraLAX and Citrucel.  However what he notices that when he has relief of constipation it is diarrhea.  He says he is not able to come down on the laxative dose because otherwise the constipation returns.  He feels the 05 is not causing any diarrhea.  Is no  abdominal pain nausea vomiting.  In terms of shortness of breath is stable.  He tried to have pulmonary function test as follow-up like a few weeks ago but he started coughing and did not produce enough spirometry's.  All we have the symptoms which is stable.  He is up-to-date with his flu shot.  He is interested in research protocols.  He uses a cane normally.      OV 12/17/2018  Subjective:  Patient ID: Stephen [name], male , DOB: 12-12-1950 , age 43 y.o. , MRN: [id number] , ADDRESS: [address] [place] [address]   12/17/2018 -   Chief Complaint  Patient presents with   Follow-up    PFT performed today but pt was unable to perform DLCO due to coughing. Pt states SOB is about the same. Denies any CP or chest tightness.    FU ILD - ccp and atypical p-anca- ALL trace positive May 2019. HP panel negative.  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 Lung function: Severity -FVC 63% and DLCO 58% showing moderate ILD. Rheum evaluation - 04/25/18 - no evidence of RA (Dr Trudie Reed). Dx of IPF given 06/04/18. STarted ofev 8/11/9   HPI Stephen [name] 43 y.o. -IPF follow-up.  He presents with his wife as usual.  He tells me that starting January 2020 nintedanib started causing significant diarrhea.  By the third week of January 2020 he reduced himself to 1 tablet once daily at 150 mg.  With this there is no diarrhea.  He says that at the full dose he would take Imodium for diarrhea and then he would get constipated for several days.  He would follow this with a laxative and then he would have explosive diarrhea.  He said the symptoms became unmanageable.  In terms of his IPF itself he is stable.  His other main issues worsening chronic cough.  He has had chronic cough for b for several years predating IPF diagnosis.  He has been on ACE inhibitor's but it appears that lisinopril got rotated to benazepril and then to losartan but the cough never improved.  So he got switch back to benazepril.  He says that his  primary care team is aware that all these agents can  cause cough but the emphasis was on controlling the blood pressure.  He says the cough is severe.  Mostly in the daytime very rarely at night.  There is a laryngeal hoarse quality to his cough.  Noticed results on fish oil and carvedilol.  We discussed about neuropathic treatment for cough but he is already on amitriptyline ROS - per HPI   OV 06/30/2019  Subjective:  Patient ID: Stephen [name], male , DOB: [birth year] , age 51 y.o. , MRN: [id number] , ADDRESS: [address] [place] [address]   06/30/2019 -   Chief Complaint  Patient presents with   IPF (idiopathic pulmonary fibrosis)    Does not feel the breathing is any different than last visit. Still using Ofev, but still having diarrhea with it.     FU ILD - ccp and atypical p-anca- ALL trace positive May 2019. HP panel negative.  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 Lung function: Severity -FVC 63% and DLCO 58% showing moderate ILD. Rheum evaluation - 04/25/18 - no evidence of RA (Dr Trudie Reed). Dx of IPF given 06/04/18. STarted ofev 06/09/18  HPI Stephen [name] 48 y.o. -presents for follow-up with his wife.  He has a diagnosis of IPF.  He has been on nintedanib since August 2019.  He preferred to take nintedanib initially because of his preference to have diarrhea.  At this point in time he tells me that he is having progressive dyspnea.  Early in the spring 2020 he noticed that he could not walk back from his office to the front door.  He could do this in 2019.  He also has had difficulty mowing the lawn with the more.  In addition is having significant diarrhea from the nintedanib.  It is severe despite agents.  He is willing to change the drug.  In between he saw the nurse practitioner he had a high-resolution CT chest in August 2020.  He has mildly progressive disease even compared to March 2020.  Walking desaturation test and symptom scores show evidence of progression and documented  below.       OV 08/27/2019  Subjective:  Patient ID: Stephen [name], male , DOB: 17-Oct-1951 , age 56 y.o. , MRN: [id number] , ADDRESS: [address] [place] [address]  FU IPF  - ccp and atypical p-anca- ALL trace positive May 2019. HP panel negative.  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 Lung function: Severity -FVC 63% and DLCO 58% showing moderate ILD. Rheum evaluation - 04/25/18 - no evidence of RA (Dr Trudie Reed). Dx of IPF given 06/04/18. STarted ofev 8/11/9. STopped Aug 2020 due to progression and diarrhea. STrted Esbriet -July 25, 2019   08/27/2019 -   Chief Complaint  Patient presents with   Follow-up    Patient reports that his breathing is about the same. He has sob with exertion and productive cough.      HPI Stephen [name] 24 y.o. -presents with his wife for follow-up.  He tells me that Jacklynn Bue was started on July 25, 2019.  He took 3 weeks for it to come.  During this time he was off nintedanib and his diarrhea resolved. Wife tells me that he has been completely sedentary because of shortness of breath.  He feels the shortness of breath is getting worse.  He is also gained weight because of his sedentary lifestyle.  He is already morbidly obese.  He has pedal edema for which she  wears stockings.  He is not using oxygen at night because he is never needed it.  He is very concerned about the shortness of breath getting worse.  In fact it is worse even since his last visit in August 2020.  This is documented in the questionnaire below.  Also seen in the pulmonary function test.  His CT scan of the chest August 2020 shows worsening within 5 months.  I reviewed this with him.  He tells me that even taking a shower makes him desaturate although today he did not desaturate here in with a forehead probe to 88%.  However he did not do a 6-minute walk test.  He feels he would benefit from oxygen   IMPRESSION: HRCT Augu 2020 compared to March 2020 1. Spectrum of findings  compatible with basilar predominant fibrotic interstitial lung disease with probable early honeycombing at the right costophrenic angle. Slight interval progression. Findings are consistent with UIP per consensus guidelines: Diagnosis of Idiopathic Pulmonary Fibrosis: An Official ATS/ERS/JRS/ALAT Clinical Practice Guideline. [place], Iss 5, [phone], Jun 30 2017. 2. Mild mediastinal lymphadenopathy is stable, compatible with benign reactive adenopathy. 3. Three-vessel coronary atherosclerosis.   Aortic Atherosclerosis (ICD10-I70.0).     Electronically Signed   By: Ilona Sorrel M.D.   On: 06/28/2019 16:35  ROS - per HPI   OV 09/29/2019  Subjective:  Patient ID: Stephen [name], male , DOB: 05/25/1951 , age 58 y.o. , MRN: [id number] , ADDRESS: [address] [place] [address]   FU IPF  -trace positive ccp and atypical p-anca- ALL trace positive May 2019 (repeat CCP - October 2020]. HP panel negative.  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 Lung function: Severity -FVC 63% and DLCO 58% showing moderate ILD. Rheum evaluation - 04/25/18 - no evidence of RA (Dr Trudie Reed). Dx of IPF given 06/04/18. STarted ofev 8/11/9. STopped Aug 2020 due to progression and diarrhea. STrted Esbriet -July 25, 2019   Associated sleep apnea present   Last CT 06/27/2019 Low risk cardiac stress test April 2019.   09/29/2019 -   Chief Complaint  Patient presents with   Follow-up    Pt states his breathing has become worse since last visit and also has a worsening cough and is coughing up dark green phlegm which he has had for awhile now. Pt is on 2L O2 24/7.      HPI Stephen [name] 11 y.o. -he is now attending pulmonary rehabilitation.  On September 16, 2019 he did have a 6-minute walk test.  He did desaturate to 80% from 96%.  He dropped his pulse ox to 86% at the second minut.  He did correct with 2 L of oxygen.  E also in the interim he did have overnight oxygen study  and we have recommended 2 L nasal cannula oxygen with the CPAP.  He is now wearing that.  Of note, during the overnight oxygen study his heart rate was 48-50.  Today he also had a 6-minute walk test at our office on 2 L oxygen and with this his pulse ox did not drop.  Overall he is reporting stability.  This can be demonstrated by the symptom questionnaire below.  He does have significant amount of shortness of breath despite wearing his 2 L.  Even though the 2 L helps his dyspnea he still says the dyspnea is significant.  He still has chronic significant pedal edema.  He continues to  be morbidly obese.  We discussed his diet.  His wife states that he eats all the time and cannot stop eating.  Apparently he goes through a carton of ice cream every few days.  He says that he will not be able to control his dietary indiscretion.  He is not interested in lung transplant.  He is enjoying pulmonary rehabilitation.  Given his diastolic dysfunction I did refer him to his cardiologist but he says he does not have an appointment.    02/13/2020 Follow up : ILD  Patient presents for a 48-monthfollow-up.  Patient says overall breathing is doing about the same.  He has had no flare of his cough or shortness of breath.  Says his activity level is about at baseline.  Says he would like to be more active has been try to do some yard work however is very hard for him to carry his oxygen tanks.  Would like a portable oxygen concentrator.  Walk test in the office shows desaturation below 88% on room air.  Requires 2 L of oxygen to keep O2 saturations greater than 88%.  Patient gets short of breath with heavy activity.  Has to stop and rest several times.  He remains on Esbriet.  Denies any nausea vomiting diarrhea.    OV 05/19/2020   Subjective:  Patient ID: RLevin [name] male , DOB: 603/07/1951 age 70y.o. years. , MRN: [id number]  ADDRESS: [address] [place] [id number]  HRonita Hipps MD Providers : Treatment Team:   Attending Provider: RBrand Males MD  FU IPF  -trace positive ccp and atypical p-anca- ALL trace positive May 2019 (repeat CCP - October 2020]. HP panel negative.  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 Lung function: Severity -FVC 63% and DLCO 58% showing moderate ILD. Rheum evaluation - 04/25/18 - no evidence of RA (Dr HTrudie Reed. Dx of IPF given 06/04/18. STarted ofev 8/11/9. STopped Aug 2020 due to progression and diarrhea. STrted Esbriet -July 25, 2019.   Chief Complaint  Patient presents with   Follow-up    Productive cough with brownish-yellow phlegm       HPI Stephen [age]ANKEN89y.o. -presents for follow-up.  Last seen in #2020 as standard of care.  After that he was on the GBuchananstudy which prematurely ended.  He continues in his bed without any problems.  He has not lost any weight.  However he is having progressive shortness of breath and progressive cough.  Particularly worse in the last 1 month.  In the last few days the cough is even more worse.  Last night he was coughing all night.  He is not able to talk without coughing.  When he takes a shower with 2 L nasal cannula he desaturates to 84%.  When he walks in the mall with 2 L nasal cannula he desaturates to 84%.  He has been asked to uptitrate his oxygen to 4 L with exertion but is not done that.  He is also got some brown-yellow sputum for the last 1 month.  There is no wheezing.  Hycodan cough syrup is not helping.  He is frustrated by all this.  And so is his wife.  There is no edema chest pain.  He easily desaturates.    06/09/2020- Interim hx Patient contacted today for virtual telephone visit/2 to 3-week follow-up ILD. Cough may be a litle bit better but is still productive with yellow/brown mucus. Cough is not  new. Hydromet medication does help. He did not notice any difference after completing doxycycline/prednisone course. No increase in oxygen demand. Denies fever, chest tightness, wheezing.      OV  06/15/2020   Subjective:  Patient ID: Stephen [name], male , DOB: Jan 23, 1951, age 60 y.o. years. , MRN: [id number],  ADDRESS: [address] [place] [address] PCP  Ronita Hipps, MD Providers : Treatment Team:  Attending Provider: Brand Males, MD    FU IPF  -trace positive ccp and atypical p-anca- ALL trace positive May 2019 (repeat CCP - October 2020]. HP panel negative.  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 Lung function: Severity -FVC 63% and DLCO 58% showing moderate ILD. Rheum evaluation - 04/25/18 - no evidence of RA (Dr Trudie Reed). Dx of IPF given 06/04/18. STarted ofev 8/11/9. STopped Aug 2020 due to progression and diarrhea. STrted Esbriet -July 25, 2019 Gabapentin for cough July  2021  DNR since aug 2021    Chief Complaint  Patient presents with   Follow-up    Pt states that he is about the same since last visit. States still has problems with SOB.       HPI Stephen [name] 38 y.o. -presents for follow-up of his IPF.  He is tolerating pirfenidone after last visit in July 2021.  At that visit cough is the dominant feature.  We started him on gabapentin once daily at night.  He has tolerated it well.  He says the cough is improved somewhat but not significantly with it.  It is definitely helping him though.  Doxycycline and prednisone did not help him.  In fact the prednisone burst made him a little bit restless.  He is now requiring 3 L of oxygen at rest.  Previously it was 2 L.  His last CT scan of the chest was December 2020.  In the interim he saw a Designer, jewellery.  He is confirmed his DNR status.  He is really frustrated by the cough.  He feels that his sputum inside but unable to cough.  He is gagging and is trying to clear his throat a lot.  In addition his wife tells me last week for a few days he had bilateral leg weakness and had increased imbalance issues.  However it was not a focal neurologic deficit.  There is no bladder or bowel problems.  It has since  resolved.  There is no speech issues.  There is no back pain.  They are to talk to the primary care physician about this.      OV 08/24/2020  Subjective:  Patient ID: Stephen [name], male , DOB: Mar 19, 1951 , age 47 y.o. , MRN: [id number] , ADDRESS: [address]  [place] [address] PCP Ronita Hipps, MD Patient Care Team: Ronita Hipps, MD as PCP - General (Family Medicine)  This Provider for this visit: Treatment Team:  Attending Provider: Brand Males, MD  Type of visit: Telephone/Video Circumstance: COVID-19 national emergency Identification of patient Stephen [name] with 25-Apr-1951 and MRN [id number] - 2 person identifier Risks: Risks, benefits, limitations of telephone visit explained. Patient understood and verbalized agreement to proceed Anyone else on call: yes - wife Patient location:his home This provider location: home due to extenutating circumstance   08/24/2020 - FU IPF   FU IPF  -trace positive ccp and atypical p-anca- ALL trace positive May 2019 (repeat CCP - October 2020]. HP panel negative.  CT march2019 - - probably UIP based  on 2018 ATS. Lung function: Severity -FVC 63% and DLCO 58% showing moderate ILD. Rheum evaluation - 04/25/18 - no evidence of RA (Dr Trudie Reed). Dx of IPF given 06/04/18. STarted ofev 8/11/9. STopped Aug 2020 due to progression and diarrhea. STrted Esbriet -July 25, 2019 .Screen Failed Pliant study due to severe cough - Sept 2021  Severe cough   - Gabapentin for cough July  2021  - 3% saline - since Aug 2021  - hycodan qhs  - Unable to do PFT  DNR but full medical care since aug 2021  On 3LNC   HPI Stephen [name] 29 y.o. - telephone visit. Joined by wife. Says dyspnea stable on 3L [place] continuous and baseline. Says cough improved with rx of gabapentin, 3% saline and hycodan qhs. Wants refill on latter. Did not qualify for pliant study due to cough interfereing with PFT quality. Tolerating esbriet well. No new issues        PFT Results  Latest Ref Rng & Units 08/27/2019 10/03/2018 08/16/2018 04/11/2018  FVC-Pre L 2.18 2.42 2.53 2.57  FVC-Predicted Pre % 56 65 67 63  Pre FEV1/FVC % % 82 91 89 89  FEV1-Pre L 1.78 2.22 2.26 2.29  FEV1-Predicted Pre % 62 80 81 76  DLCO uncorrected ml/min/mmHg 13.67 - - 16.68  DLCO UNC% % 59 - - 58  DLVA Predicted % 97 - - 106    ROS - per HPI  IMPRESSION: HRCT Sep 2021 1. Redemonstrated pattern of moderate pulmonary fibrosis in a pattern with apical to basal gradient featuring irregular peripheral interstitial opacity, traction bronchiectasis, bronchiolectasis, and areas of honeycombing at the lung bases. Fibrotic findings are not significantly changed in comparison to examination dated 10/30/2019, but are clearly worsened over time on multiple prior examinations dating back to 02/01/2014. 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 2. Cardiomegaly and coronary artery disease. 3. Aortic Atherosclerosis (ICD10-I70.0).     Electronically Signed   By: Eddie Candle M.D.   On: 07/07/2020 09:20     OV 11/17/2020  Subjective:  Patient ID: Stephen [name], male , DOB: 03/02/1951 , age 64 y.o. , MRN: [id number] , ADDRESS: [address] [place] [address] PCP Ronita Hipps, MD Patient Care Team: Ronita Hipps, MD as PCP - General (Family Medicine)  This Provider for this visit: Treatment Team:  Attending Provider: Brand Males, MD    11/17/2020 -   Chief Complaint  Patient presents with   Follow-up    Pt states he has been doing okay since last visit. States breathing may be a little worse since last visit. Pt was diagnosed with Covid 10/28/20 but just had mild symptoms.     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 HP panel negative.  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 Lung function: Severity -FVC  63% and DLCO 58% showing moderate ILD. Rheum evaluation - 04/25/18 - no evidence of RA (Dr Trudie Reed). Dx of IPF given 06/04/18. STarted ofev 8/11/9. STopped Aug 2020 due to progression and diarrhea. STrted Esbriet -July 25, 2019 .Screen Failed Pliant study due to severe cough - Sept 2021  Severe cough   - Gabapentin for cough July  2021  - 3% saline - since Aug 2021  - hycodan qhs  - Unable to do PFT  DNR but full medical care since aug 2021  On 3LNC    HPI Stephen [name] 56 y.o. -presents with his wife.  He had a good holiday but on October 28, 2020 he developed omicron COVID.  He was barely symptomatic.  Wife had some sinus congestion.  He did not deteriorate.  He is doing well now.  This is following an exposure.  He continues his Esbriet.  He continues 3 L nasal cannula he feels stable.  His cough continues to be a problem.  He is not able to do 3% saline now because of backorder.  He is not doing Hycodan because it is too expensive but is willing for me to send another prescription.  Overall stable    CT Chest data  No results found.    PFT  PFT Results Latest Ref Rng & Units 08/27/2019 10/03/2018 08/16/2018 04/11/2018  FVC-Pre L 2.18 2.42 2.53 2.57  FVC-Predicted Pre % 56 65 67 63  Pre FEV1/FVC % % 82 91 89 89  FEV1-Pre L 1.78 2.22 2.26 2.29  FEV1-Predicted Pre % 62 80 81 76  DLCO uncorrected ml/min/mmHg 13.67 - - 16.68  DLCO UNC% % 59 - - 58  DLVA Predicted % 97 - - 106      OV 02/17/2021  Subjective:  Patient ID: Stephen [name], male , DOB: 1951-10-07 , age 78 y.o. , MRN: [id number] , ADDRESS: [address] [place] [address] PCP Ronita Hipps, MD Patient Care Team: Ronita Hipps, MD as PCP - General (Family Medicine)  This Provider for this visit: Treatment Team:  Attending Provider: Brand Males, MD    02/17/2021 -   Chief Complaint  Patient presents with   Follow-up    Reports increased cough that is sometimes productive with greyish sputum for past few  months.        FU IPF  -trace positive ccp and atypical p-anca- ALL trace positive May 2019 (repeat CCP - October 2020]. HP panel negative.  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 Lung function: Severity -FVC 63% and DLCO 58% showing moderate ILD. Rheum evaluation - 04/25/18 - no evidence of RA (Dr Trudie Reed). Dx of IPF given 06/04/18. STarted ofev 8/11/9. STopped Aug 2020 due to progression and diarrhea. STrted Esbriet -July 25, 2019 .Screen Failed Pliant study due to severe cough - Sept 2021. Lst CT Sept 2021  Severe cough   - Gabapentin for cough July  2021  - 3% saline - since Aug 2021  - hycodan qhs  - Unable to do PFT  DNR but full medical care since aug 2021  Omicron COVID -December 2021  On [facility]     HPI JESSI JESSOP 70 y.o. -returns for follow-up.  Since his last visit wife is reporting persistent cough.  He states he is coughing most of the day.  At night when he sleeps he does not cough but when he goes to the bathroom comes back he coughs quite a bit.  Guaifenesin syrup is not helping.  He used to be on Hycodan but this was on backorder.  3% saline was helping but this is also now on backorder and he is not able to use this.  He is frustrated by all this.  Uses 3 L of oxygen at night but on room air at rest he was fine.  His walking test and symptom scores are listed below.  Symptoms are gradually worsening over time.  While we walked  him it also showed that he has a easier tendency to desaturate compared to the past.  He is on gabapentin for cough but is not helping.  He is on Esbriet and is tolerating this fine.  Last liver function test November 18, 2018 was normal.  Noted that he has lost some weight.  They think it is because of low appetite on pirfenidone but in the symptom questionnaire he did not report low appetite.  Nevertheless he reports to me that his appetite is lower PFT     IMPRESSION: 1. Redemonstrated pattern of moderate pulmonary fibrosis in  a pattern with apical to basal gradient featuring irregular peripheral interstitial opacity, traction bronchiectasis, bronchiolectasis, and areas of honeycombing at the lung bases. Fibrotic findings are not significantly changed in comparison to examination dated 10/30/2019, but are clearly worsened over time on multiple prior examinations dating back to 02/01/2014. 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 2. Cardiomegaly and coronary artery disease. 3. Aortic Atherosclerosis (ICD10-I70.0).     Electronically Signed   By: Eddie Candle M.D.   On: 07/07/2020 09:20    OV 03/23/2021  Subjective:  Patient ID: Stephen [name], male , DOB: 1951/07/03 , age 22 y.o. , MRN: [id number] , ADDRESS: [address] [place] [address] PCP Ronita Hipps, MD Patient Care Team: Ronita Hipps, MD as PCP - General (Family Medicine)  This Provider for this visit: Treatment Team:  Attending Provider: Brand Males, MD    03/23/2021 -   Chief Complaint  Patient presents with   Follow-up    Pt is still having problems with the cough. States breathing is about the same      FU IPF  -trace positive ccp and atypical p-anca- ALL trace positive May 2019 (repeat CCP - October 2020]. HP panel negative.  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 Lung function: Severity -FVC 63% and DLCO 58% showing moderate ILD. Rheum evaluation - 04/25/18 - no evidence of RA (Dr Trudie Reed). Dx of IPF given 06/04/18. STarted ofev 8/11/9. STopped Aug 2020 due to progression and diarrhea. STrted Esbriet -July 25, 2019 .Screen Failed Pliant study due to severe cough - Sept 2021. Lst CT Sept 2021  Severe cough   - Gabapentin for cough July  2021  - 3% saline - since Aug 2021  - hycodan qhs -> backorder and codeine wih guaifeneisn -> also backorder -> ran out spring 2022  - Unable to do PFT  DNR but full medical care since aug  2021  [facility] -December 2021  On [facility]   HPI DELANEY SCHNICK 70 y.o. -returns for follow-up.  At this point in time he is not able to get Hycodan or guaifenesin.  Because both are on backorder according to upstream.  In addition we tried to fill it.  For some reason I electronic medical record is printing paper on this.  It is not doing electronic routing to the pharmacy.  We do not know why.  We tried this multiple times over the course of the last few days.  Symptoms are stable.  Uses 3 L oxygen.  He is tolerating pirfenidone.  He is continues to lose weight because of low appetite.  We think this because of pirfenidone.  He is morbidly obese with a weight loss is healthy at this point.  However his cough is a chronic nagging problem.  This is despite  gabapentin.  He does not want to do daily prednisone but after talking to him he was open to doing a few days of a prednisone burst.  We have tried to refill his 2 opiate prescriptions by paper.  His wife reports that she will take it to pharmacy and if there are problems she will let us know.  His most recent liver function was a few months ago.    OV 06/16/2021  Subjective:  Patient ID: Stephen [name], male , DOB: 07-26-1951 , age 24 y.o. , MRN: [id number] , ADDRESS: [address] [place] [address] PCP Ronita Hipps, MD Patient Care Team: Ronita Hipps, MD as PCP - General (Family Medicine)  This Provider for this visit: Treatment Team:  Attending Provider: Brand Males, MD    06/16/2021 -   Chief Complaint  Patient presents with   Follow-up    Pt states his breathing has become worse and also states that the cough is worse.    FU IPF  -trace positive ccp and atypical p-anca- ALL trace positive May 2019 (repeat CCP - October 2020]. HP panel negative.  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 Lung function: Severity -FVC 63% and DLCO 58% showing moderate ILD. Rheum evaluation - 04/25/18 - no evidence of RA (Dr Trudie Reed). Dx of  IPF given 06/04/18. STarted ofev 8/11/9. STopped Aug 2020 due to progression and diarrhea. STrted Esbriet -July 25, 2019 .Screen Failed Pliant study due to severe cough - Sept 2021. Lst CT Sept 2021  Severe cough   - Gabapentin for cough July  2021  - 3% saline - since Aug 2021  - hycodan qhs -> backorder and codeine wih guaifeneisn -> also backorder -> ran out spring 2022  - Unable to do PFT  DNR but full medical care since aug 2021  [place] -December 2021  On [facility]  HPI MAYKEL REITTER 70 y.o. -returns for follow-up.  He presents with his wife.  He is significantly declined.  His symptom scores are worse.  Using 4 L of oxygen and desaturating easily.  On his visit to April 03, 2021 he went to the bathroom without oxygen and he lost consciousness and fell down.  He sustained bruising on his back.  Since then he seen a back specialist disc had epidural steroid injection for history.  He is scheduled for another 1.  He has become less mobile.  His cough is gotten worse.  He says 1 night he woke up just coughing.  He is got a laryngeal quality to his cough.  It has been a while since he saw ENT.  He is not able to talk or eat without coughing.  Most of the nights he is able to sleep without coughing but some nights he is gotten up.  This is despite Hycodan.  He took a short course of steroids because he was nervous about steroid effects and he handled it well.  But he cannot remember if the steroid helped him.  He is taking Delsym and gabapentin but all this is not helping.  He is tolerating his Esbriet well.  He is now on 1 big pill 3 times daily.  He is willing to see ENT again.   CT Chest data  No results found.    SYMPTOM SCALE - ILD 2/18/ 2020  06/30/2019 Stop ofev 08/27/2019 esbriet since July 25, 2019 09/29/2019 esbriet 06/15/2020 243# , esbriet 11/17/2020  02/17/2021 236#, esbriet 03/23/2021 233# =  esbriet 06/16/2021 237#  O2 use RA ra ra  2 L 3L 3L 3L 3L 4L  Shortness of  Breath 0 -> 5 scale with 5 being worst (score 6 If unable to do)          At rest _0 Simple tasks - showers, clothes change, eating, shaving _1 Walking level at own pace 1 1 x _2 Walking up Stairs _3 Total (40 - 48) Dyspnea Score _4 How bad is your cough? _5 How bad is your fatigue _6 0 0 0 0        0 0 0 0        0 0 0 0        0 0 3 0        0 0 0 0      Simple office walk 185 feet x  3 laps goal with forehead probe 03/05/2018  06/04/2018   09/03/2018  12/17/2018  06/30/2019 Stop ofev Start esbriet 08/27/2019  April 20210 05/19/2020  02/17/2021 (covid dec 2021) 03/23/2021   O2 used _7  Room air Needs 2L at rest, 3L exertion 92 93% RA atrest   Number laps completed All 3 laops All 3 laps all3 3 x 185 3    Intended 3 laps   Comments about pace Walks very slow due to baseline balance issue "40% of balance only due to remote trauma  nomral mod pace using cane Slow with cane Slow with ane Slow with cane   Walked with walker   Resting Pulse Ox/HR 100% and 55/min 100% and 57/min 100% and 59/min 100% and 61/min 97% and 56/min 98% and 65/min  92% ra at rest 93% RA at restm ?HR 68   Final Pulse Ox/HR 97% and 78/min 97% and 77/mi 98% and 76/min 95% and 91/min 91% and 87/min 92% and 94/mio   84% - half way of firstr laps, HR 84   Desaturated </= 88% _8  no      Desaturated <= 3% points yes yes no Y84es, 5 points Yes, 6 points Yes, 6 point      Got Tachycardic >/= 90/min no no no yes no yes      Symptoms at end of test No, denied dyspnea x none none none veryt dyspneic      Miscellaneous comments npne x x             PFT  PFT Results Latest Ref Rng & Units 08/27/2019 10/03/2018 08/16/2018 04/11/2018  FVC-Pre L 2.18 2.42 2.53 2.57  FVC-Predicted Pre % 56 65 67 63  Pre FEV1/FVC % % 82 91 89 89  FEV1-Pre L 1.78 2.22 2.26 2.29   FEV1-Predicted Pre % 62 80 81 76  DLCO uncorrected ml/min/mmHg 13.67 - - 16.68  DLCO UNC% % 59 - - 58  DLVA Predicted % 97 - - 106       has a past medical history of Abdominal aortic atherosclerosis ([place]) (03/16/2021), Abnormality of gait (01/21/2014), Allergy, Arthritis, ASVD (arteriosclerotic vascular disease) (02/06/2018), Benign paroxysmal positional vertigo (  01/21/2014), Chronic respiratory failure with hypoxia ([place]) (02/13/2020), Dizziness (03/14/2017), GERD (gastroesophageal reflux disease), Heart murmur, Hiatal hernia, Hypercholesteremia (02/05/2018), Hyperlipidemia, Hypertension, Idiopathic pulmonary fibrosis ([place]) (09/11/2018), Morbid obesity ([place]) (10/13/2020), Obesity (02/05/2018), Overweight (09/11/2018), Pain in joint of right hip (03/15/2021), Post-traumatic headache (01/21/2014), Research study patient (11/21/2019), Sleep apnea, and Vertigo.   reports that he has never smoked. He has never used smokeless tobacco.  Past Surgical History:  Procedure Laterality Date   CARDIAC CATHETERIZATION  03/22/2001   LEG SURGERY Bilateral 04/2001    Allergies  Allergen Reactions   Ciprofloxacin Other (See Comments)    colitis   Codeine     Other reaction(s): Hallucinations    Immunization History  Administered Date(s) Administered   Fluad Quad(high Dose 65+) 06/30/2019   Influenza, High Dose Seasonal PF 08/28/2017, 07/19/2018, 07/22/2020   PFIZER(Purple Top)SARS-COV-2 Vaccination 11/16/2019, 12/06/2019, 08/12/2020   Pneumococcal Conjugate-13 08/25/2016   Pneumococcal Polysaccharide-23 11/18/2007    Family History  Problem Relation Age of Onset   Dementia Mother    Heart attack Father    Aneurysm Father    Cancer Maternal Grandmother    Aneurysm Maternal Grandmother      Current Outpatient Medications:    amitriptyline (ELAVIL) 75 MG tablet, Take 75 mg by mouth at bedtime., Disp: , Rfl:    amLODipine (NORVASC) 10 MG tablet, Take 10 mg by mouth daily., Disp: , Rfl:    aspirin EC  81 MG tablet, Take 81 mg by mouth daily., Disp: , Rfl:    calcium carbonate (OSCAL) 1500 (600 Ca) MG TABS tablet, Take 1 tablet by mouth 2 (two) times daily with a meal., Disp: , Rfl:    calcium carbonate (TUMS - DOSED IN MG ELEMENTAL CALCIUM) 500 MG chewable tablet, Chew 1 tablet by mouth daily., Disp: , Rfl:    cholecalciferol (VITAMIN D) 1000 units tablet, Take 1,000 Units by mouth daily., Disp: , Rfl:    cloNIDine (CATAPRES) 0.3 MG tablet, Take 0.3 mg by mouth daily after supper. , Disp: , Rfl:    dicyclomine (BENTYL) 10 MG capsule, Take 10 mg by mouth 3 (three) times daily before meals. , Disp: , Rfl:    gabapentin (NEURONTIN) 300 MG capsule, Take 300 mg by mouth 3 (three) times daily., Disp: , Rfl:    ketoconazole (NIZORAL) 2 % shampoo, Apply 1 application topically 2 (two) times a week., Disp: , Rfl:    lansoprazole (PREVACID) 30 MG capsule, Take 30 mg by mouth daily at 12 noon., Disp: , Rfl:    Methylcellulose, Laxative, (CITRUCEL PO), Take 1 Scoop by mouth daily., Disp: , Rfl:    metoprolol tartrate (LOPRESSOR) 50 MG tablet, Take 1 tablet by mouth 2 (two) times daily., Disp: , Rfl:    mometasone (NASONEX) 50 MCG/ACT nasal spray, Place 2 sprays into the nose as needed (allergies)., Disp: , Rfl:    montelukast (SINGULAIR) 10 MG tablet, Take 10 mg by mouth at bedtime., Disp: , Rfl:    Multiple Vitamin (MULTIVITAMIN WITH MINERALS) TABS tablet, Take 1 tablet by mouth daily., Disp: , Rfl:    nabumetone (RELAFEN) 750 MG tablet, Take 750 mg by mouth 2 (two) times daily., Disp: , Rfl:    olmesartan (BENICAR) 20 MG tablet, Take 1 tablet by mouth daily., Disp: , Rfl:    Pirfenidone (ESBRIET) 801 MG TABS, Take 801 mg by mouth 3 (three) times daily with meals., Disp: 90 tablet, Rfl: 5   polyethylene glycol (MIRALAX / GLYCOLAX) packet, Take 17 g by mouth daily.,  Disp: , Rfl:    predniSONE (DELTASONE) 10 MG tablet, Take 4 tablets (40 mg total) by mouth daily with breakfast for 5 days, THEN 3 tablets (30  mg total) daily with breakfast for 5 days, THEN 2 tablets (20 mg total) daily with breakfast for 5 days. Then continue on 11m daily., Disp: 90 tablet, Rfl: 5   Probiotic Product (PROBIOTIC DAILY PO), Take 1 capsule by mouth daily., Disp: , Rfl:    rosuvastatin (CRESTOR) 10 MG tablet, Take 10 mg by mouth daily., Disp: , Rfl:    sodium chloride HYPERTONIC 3 % nebulizer solution, Take by nebulization as needed for other., Disp: 750 mL, Rfl: 12   spironolactone (ALDACTONE) 25 MG tablet, Take 25 mg by mouth 3 (three) times a week., Disp: , Rfl:    UNABLE TO FIND, Take 1 capsule by mouth 3 (three) times daily. Med Name: blood sugar harmony, Disp: , Rfl:    vitamin B-12 (CYANOCOBALAMIN) 1000 MCG tablet, Take 1,000 mcg by mouth daily., Disp: , Rfl:    vitamin E 400 UNIT capsule, Take 400 Units by mouth daily., Disp: , Rfl:    guaiFENesin-codeine 100-10 MG/5ML syrup, Take 5 mLs by mouth 2 (two) times daily as needed., Disp: 120 mL, Rfl: 0      Objective:   Vitals:   06/16/21 1134  BP: 130/78  Pulse: 72  Temp: 98.2 F (36.8 C)  TempSrc: Oral  SpO2: 94%  Weight: 230 lb 3.2 oz (104.4 kg)  Height: _0  (1.676 m)    Estimated body mass index is 37.16 kg/m as calculated from the following:   Height as of this encounter: _1  (1.676 m).   Weight as of this encounter: 230 lb 3.2 oz (104.4 kg).  _2 @  [facility]  06/16/21 1134  Weight: 230 lb 3.2 oz (104.4 kg)     Physical Exam obese male seated on a wheelchair.  Oxygen on.  He has crackles.  Chronic venous stasis edema.  He has a mask on.  Normal heart sounds obese abdomen.     Assessment:       ICD-10-CM   1. IPF (idiopathic pulmonary fibrosis) (HCC)  J84.112 Hepatic function panel    2. Medication monitoring encounter  Z51.81 Hepatic function panel         Plan:     Patient Instructions     ICD-10-CM   1. IPF (idiopathic pulmonary fibrosis) ([place]  J84.112   2. Chronic cough  R05.3   3. Therapeutic drug  monitoring  Z51.81   4. Chronic respiratory failure with hypoxia (HCC)  J96.11   5. Personal history of COVID-19  Z86.16     Pulmonary fibrosis  getiting worse slowly with based on 4L [place] use (last year 3), increased cough, easy desaturations  Tolerating esbriet well  Cough continues to be a problem  and destroying your quality of life despite gabapentin and opioid cough syrup  No problem with short course prednisone last time  Plan - check LFT 06/16/2021 - continue esbriet for IPF  -Continue Delsym  - continue o2 4 L nasal cannula as before but adjust to keep pulse ox > 88%  - no more PFT testing due to cough atlest till cough improves -Refill Hycodan and codeine/guaifenesisn - Start long term prednisone (based on shared decision making)  -   Take prednisone 40 mg daily x 5 days, then 380mdaily x 5 days, then 2056maily x 5 days and then 97m78mily  to continue - continue 3% saline neb as needed  - can consider nebulzied lidoacine daily in the future - will talk to pur pharmacist - re-refer ENT  Followup - 3 weeks telephone visit with app or DR Chase Caller -6-8 weeks with Dr Chase Caller for face to face visit in 30 min slot - = ILD symptom score at followup  ( Level 05 visit: Estb 40-54 min  in  visit type: on-site physical face to visit  in total care time and counseling or/and coordination of care by this undersigned MD - Dr Brand Males. This includes one or more of the following on this same day 06/16/2021: pre-charting, chart review, note writing, documentation discussion of test results, diagnostic or treatment recommendations, prognosis, risks and benefits of management options, instructions, education, compliance or risk-factor reduction. It excludes time spent by the [facility] or office staff in the care of the patient. Actual time 40 min)    SIGNATURE    Dr. Brand Males, M.D., F.C.C.P,  Pulmonary and Critical Care Medicine Staff Physician, [place]  Director - Interstitial Lung Disease  Program  Pulmonary [place] [facility] [place], [place], [address]  Pager: [phone], If no answer or between  15:00h - 7:00h: call [phone] Telephone: [phone]  12:44 PM 06/16/2021

## 2021-06-20 ENCOUNTER — Telehealth: Payer: Self-pay | Admitting: Internal Medicine

## 2021-06-20 NOTE — Telephone Encounter (Signed)
I called and spoke with the wife Stephen [name], who is on DPR regarding patient. Patient was given prednisone on 06/16/21 at last OV and took first dose on Friday evening. Wife stated patient cough became much worse and he "felt awful. His BP went up to 170/90 and he had a awful headache. He has gradually starting going back to baseline but has not taken anymore pred and does not want to. They are looking for other recs. Will route to MR.  Dr. Chase Caller, please advise. Thanks!

## 2021-06-23 DIAGNOSIS — G4733 Obstructive sleep apnea (adult) (pediatric): Secondary | ICD-10-CM | POA: Diagnosis not present

## 2021-06-24 DIAGNOSIS — M5416 Radiculopathy, lumbar region: Secondary | ICD-10-CM | POA: Diagnosis not present

## 2021-06-24 DIAGNOSIS — M5116 Intervertebral disc disorders with radiculopathy, lumbar region: Secondary | ICD-10-CM | POA: Diagnosis not present

## 2021-06-24 NOTE — Telephone Encounter (Signed)
Dr. Chase Caller please advise

## 2021-06-27 NOTE — Telephone Encounter (Signed)
Plan - Stop prednisone - At this point in time he is on Elavil, gabapentin, guaifenesin-codeine and he is on maximal therapy.  I can try morphine low-dose as a high and opioid for cough -Alternative would be home palliative care

## 2021-06-28 NOTE — Telephone Encounter (Signed)
I have called and spoke with pts wife and she stated that the pt did stop the prednisone and he only took one dose of this.  She stated that she didn't think that he would want the morphine, but she is going to ask him and if he decides he wants this she will call back.  Will forward to MR as an FYI.

## 2021-06-28 NOTE — Telephone Encounter (Signed)
Lm for patient's spouse, Delores(DPR)

## 2021-06-29 DIAGNOSIS — I10 Essential (primary) hypertension: Secondary | ICD-10-CM | POA: Diagnosis not present

## 2021-06-29 DIAGNOSIS — E785 Hyperlipidemia, unspecified: Secondary | ICD-10-CM | POA: Diagnosis not present

## 2021-07-07 ENCOUNTER — Ambulatory Visit (INDEPENDENT_AMBULATORY_CARE_PROVIDER_SITE_OTHER): Payer: PPO | Admitting: Primary Care

## 2021-07-07 ENCOUNTER — Other Ambulatory Visit: Payer: Self-pay

## 2021-07-07 ENCOUNTER — Encounter: Payer: Self-pay | Admitting: Primary Care

## 2021-07-07 DIAGNOSIS — R053 Chronic cough: Secondary | ICD-10-CM

## 2021-07-07 DIAGNOSIS — J84112 Idiopathic pulmonary fibrosis: Secondary | ICD-10-CM | POA: Diagnosis not present

## 2021-07-07 MED ORDER — GABAPENTIN 300 MG PO CAPS: 300.0000 mg | ORAL_CAPSULE | Freq: Three times a day (TID) | ORAL | 5 refills | Status: AC

## 2021-07-07 MED ORDER — SODIUM CHLORIDE 3 % IN NEBU: INHALATION_SOLUTION | Freq: Two times a day (BID) | RESPIRATORY_TRACT | 12 refills | Status: DC

## 2021-07-07 MED ORDER — SODIUM CHLORIDE 3 % IN NEBU: INHALATION_SOLUTION | Freq: Two times a day (BID) | RESPIRATORY_TRACT | 12 refills | Status: AC

## 2021-07-07 MED ORDER — SODIUM CHLORIDE 3 % IN NEBU: INHALATION_SOLUTION | RESPIRATORY_TRACT | 12 refills | Status: DC | PRN

## 2021-07-07 NOTE — Progress Notes (Signed)
Virtual Visit via Telephone Note  I connected with [name] on 07/07/21 at  9:30 AM EDT by telephone and verified that I am speaking with the correct person using two identifiers.  Location: Patient: Home Provider: Office    I discussed the limitations, risks, security and privacy concerns of performing an evaluation and management service by telephone and the availability of in person appointments. I also discussed with the patient that there may be a patient responsible charge related to this service. The patient expressed understanding and agreed to proceed.   History of Present Illness: 70 year old male, never smoked.  Past medical history significant for idiopathic pulmonary fibrosis, chronic respiratory failure with hypoxia, sleep apnea, obesity.  Patient of Dr. Chase Caller.  Maintained on Esbriet, gabapentin and guaifenesin-codeine cough syrup  Previous [facility] pulmonary encounter:  06/16/2021 -  Ramaswamy  Chief Complaint  Patient presents with   Follow-up    Pt states his breathing has become worse and also states that the cough is worse.    FU IPF  -trace positive ccp and atypical p-anca- ALL trace positive May 2019 (repeat CCP - October 2020]. HP panel negative.  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 Lung function: Severity -FVC 63% and DLCO 58% showing moderate ILD. Rheum evaluation - 04/25/18 - no evidence of RA (Dr Trudie Reed). Dx of IPF given 06/04/18. STarted ofev 8/11/9. STopped Aug 2020 due to progression and diarrhea. STrted Esbriet -July 25, 2019 .Screen Failed Pliant study due to severe cough - Sept 2021. Lst CT Sept 2021  Severe cough   - Gabapentin for cough July  2021  - 3% saline - since Aug 2021  - hycodan qhs -> backorder and codeine wih guaifeneisn -> also backorder -> ran out spring 2022  - Unable to do PFT  DNR but full medical care since aug 2021  [place] -December 2021  On [facility]  HPI DEWIE AHART 70 y.o. -returns for follow-up.  He  presents with his wife.  He is significantly declined.  His symptom scores are worse.  Using 4 L of oxygen and desaturating easily.  On his visit to April 03, 2021 he went to the bathroom without oxygen and he lost consciousness and fell down.  He sustained bruising on his back.  Since then he seen a back specialist disc had epidural steroid injection for history.  He is scheduled for another 1.  He has become less mobile.  His cough is gotten worse.  He says 1 night he woke up just coughing.  He is got a laryngeal quality to his cough.  It has been a while since he saw ENT.  He is not able to talk or eat without coughing.  Most of the nights he is able to sleep without coughing but some nights he is gotten up.  This is despite Hycodan.  He took a short course of steroids because he was nervous about steroid effects and he handled it well.  But he cannot remember if the steroid helped him.  He is taking Delsym and gabapentin but all this is not helping.  He is tolerating his Esbriet well.  He is now on 1 big pill 3 times daily.  He is willing to see ENT again.    07/07/2021- interim hx  During his last visit with Dr. Chase Caller it was noted that patient has declined. Pulmonary fibrosis appears to be worsening, he is requiring more oxygen and easily desaturates. He  is tolerating esbriet. Cough continues to be a problem despite gabapentin and opioid cough syrup. No further pulmonary function testing atleast til cough improved. Consider nebulized lidocaine and re-refer to ENT   Patient contacted today for 3 week follow-up. He reports having a reaction after taking prednisone that was prescribed by his PCP in August. He noted headache and elevated blood pressure reading after his first dose and stopped taking. He had an episode of hypoxemia while sitting on the side of his bed in the morning after a coughing fit and passed out which happened on August 27th. He is currently on 5L oxygen. They do not have sodium  chloride nebulizer d/t backorder and ran out of gabapentin two weeks ago.    SYMPTOM SCALE - ILD 2/18/ 2020  06/30/2019 Stop ofev 08/27/2019 esbriet since July 25, 2019 09/29/2019 esbriet 06/15/2020 243# , esbriet 11/17/2020  02/17/2021 236#, esbriet 03/23/2021 233# = esbriet 06/16/2021 237# 07/07/2021   O2 use RA ra ra  2 L 3L 3L 3L 3L 4L 5L  Shortness of Breath 0 -> 5 scale with 5 being worst (score 6 If unable to do)           At rest _0 2.5  Simple tasks - showers, clothes change, eating, shaving _1 Walking level at own pace 1 1 x _2 Walking up Stairs _3 Total (40 - 48) Dyspnea Score _4 18.5  How bad is your cough? _5 How bad is your fatigue _6 _7 Observations/Objective:   - Able to speak in full sentences; no overt shortness of breath or wheezing  - O2 97% 5L  Assessment and Plan:  IPF: - Clinically worse. Cough and transient episodes of hypoxia continue to be an issue. He did not tolerate last round of prednisone d/t headache and elevated BP readings. He ran our of gabapentin and has not had hypertonic saline nebulizer in over a year. We will ensure he gets these medication before considering any additional changes. Re-refer to ENT for chronic cough.  Chronic respiratory failure: - Continue supplemental oxygen at 4-5L to maintain O2 >88-90%   Follow Up Instructions:   - Sept 29th with Dr. Chase Caller  I discussed the assessment and treatment plan with the patient. The patient was provided an opportunity to ask questions and all were answered. The patient agreed with the plan and demonstrated an understanding of the instructions.   The patient was advised to call back or seek an in-person evaluation if the symptoms worsen or if the condition fails to improve as  anticipated.  I provided 28 minutes of non-face-to-face time during this encounter.   Martyn Ehrich, NP

## 2021-07-07 NOTE — Patient Instructions (Addendum)
Start hypertonic saline nebulizer twice a day Resume Gabapentin three times a day Continue Esbriet and guaifenesin-codeine cough syrup as prescribed  Continue oxygen 4-5L 27/7 to maintain O2 >88-90%  Follow-up September 29th with Dr. Guadalupe Dawn

## 2021-07-11 DIAGNOSIS — M5136 Other intervertebral disc degeneration, lumbar region: Secondary | ICD-10-CM | POA: Diagnosis not present

## 2021-07-11 DIAGNOSIS — M5416 Radiculopathy, lumbar region: Secondary | ICD-10-CM | POA: Diagnosis not present

## 2021-07-28 ENCOUNTER — Other Ambulatory Visit: Payer: Self-pay

## 2021-07-28 ENCOUNTER — Other Ambulatory Visit: Payer: Self-pay | Admitting: *Deleted

## 2021-07-28 ENCOUNTER — Ambulatory Visit: Payer: PPO | Admitting: Internal Medicine

## 2021-07-28 ENCOUNTER — Encounter: Payer: Self-pay | Admitting: Internal Medicine

## 2021-07-28 VITALS — BP 140/78 | HR 84 | Temp 97.8°F | Ht 66.0 in | Wt 226.6 lb

## 2021-07-28 DIAGNOSIS — J84112 Idiopathic pulmonary fibrosis: Secondary | ICD-10-CM | POA: Diagnosis not present

## 2021-07-28 DIAGNOSIS — J9611 Chronic respiratory failure with hypoxia: Secondary | ICD-10-CM

## 2021-07-28 DIAGNOSIS — Z7189 Other specified counseling: Secondary | ICD-10-CM | POA: Diagnosis not present

## 2021-07-28 DIAGNOSIS — R053 Chronic cough: Secondary | ICD-10-CM | POA: Diagnosis not present

## 2021-07-28 DIAGNOSIS — Z8616 Personal history of COVID-19: Secondary | ICD-10-CM | POA: Diagnosis not present

## 2021-07-28 DIAGNOSIS — Z23 Encounter for immunization: Secondary | ICD-10-CM

## 2021-07-28 DIAGNOSIS — Z7185 Encounter for immunization safety counseling: Secondary | ICD-10-CM

## 2021-07-28 DIAGNOSIS — Z5181 Encounter for therapeutic drug level monitoring: Secondary | ICD-10-CM

## 2021-07-28 NOTE — Progress Notes (Signed)
IOV 03/05/2018  Chief Complaint  Patient presents with   Consult    Referral per MD Helene Kelp, Dec 2018 had sinus infection and still has cough, hx of mild bronchiectasis, later was given symbicort which patient believes is making him more SOB.      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  He lives in [facility] and presents with his wife for evaluation of pulmonary fibrosis to the ILD clinic.  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  During this time he was not exposed to any metal dust but he was exposed to mold and damp environments intermittently but not always.  He denies any mold or mildew in the house.  He tells me that in December 2018 he had a sinus infection and since then has a lingering cough that never really went away.  It looks like from the primary care physician he saw dentist and from there he had imaging and this finally all resulted in a high-resolution CT chest January 21, 2018 that I personally visualized.  The official report is that it is indeterminate for UIP.  In my personal visualization there is bilateral bibasal symmetric disease with definite of craniocaudal gradient.  The septal thickening and also cylindrical bronchiectasis and peripheral bronchiec bronchiolectasis.  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  He does not have much of her shortness of breath but then he is morbidly obese and has balance issues and is quite limited.  He was seen by a local pulmonologist will give him a 2-5-year survival rate and therefore he is frustrated.  He was started on Symbicort which actually made things worse for him.  [place] pulmonary interstitial lung disease questionnaire  Symptoms: Since dec 2019. Some sputjm +. Better since started. Insidious dyspnea x 4 months. Same since onset. No episodic  dyspnea. lLeve 4 dyspnea walking wiuth peers and level 5 walking up hill/stairs. Level 1 for dressing, Level 2 fo rshopping and mpwing lawn  ROS: chronic balance issues affecting mobiluity.  In association with this he has fatigue for the last 3 years.  He has dry mouth for the last several years associated with some dysphagia for the last 10 years heartburn and acid reflux for several years.  Daytime set sleepiness for the last several years but denies any oral ulcers or rash or weight loss or recurrent fever or arthralgia  Past medical history: Denies any asthma COPD or heart failure rheumatoid arthritis or scleroderma or systemic lupus erythematosus or polymyositis or Sjogren's but is positive for sleep apnea and hiatal hernia.  Negative for pulmonary hypertension diabetes thyroid disease stroke seizures hepatitis tuberculosis kidney disease blood clots heart disease other than murmur pleurisy  Family history of pulmonary disease: Positive for asthma in the mother but nobody with pulmonary fibrosis  Personal exposure history: He has never smoked cigarettes did smoke cigars for 3 years.  [place]  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  During this time there is intermittent mold exposure  Pulmonary toxicity history denies a laundry list of pulmonary toxicity medications    OV 04/11/2018  Chief Complaint  Patient presents with   Follow-up    PFT performed today.  Pt is still coughing with occ. white mucus and pt also states he believes SOB is worse and is becoming SOB more easily.    Follow-up interstitial lung disease work-up  Symptoms: In the interim no new symptoms.  He does admit to having chronic  arthralgia for many decades after sheetrock fell on him.  He also has joint stiffness especially in the hands but he does not know if it is early morning stiffness.  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 Lorin Picket.  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  His autoimmune and vascular does panel shows trace positivity for ANCA and cyclic citrulline peptide.    OV 06/04/2018   Chief Complaint  Patient presents with   Follow-up    Pt states things have been about the same since last visit.   FU ILD - ccp and atypical p-anca trace positive May 2019. HP panel negative.  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 Lung function: Severity -FVC 63% and DLCO 58% showing moderate ILD. Rheum evaluation - 04/25/18 - no evidence of RA (Dr Trudie Reed)  Here with his wife to discuss test results. In the interim he saw Dr. Gavin Pound on 04/25/2018. She is of rheumatology. Clinically she did not find any evidence of systemic rheumatoid arthritis or vasculitis. Therefore he is here for decision making regarding his ILD. In the interim there are no new issues.  OV 07/16/2018  Subjective:  Patient ID: Stephen [name], male , DOB: 05/19/1951 , age 70 y.o. , MRN: [id number] , ADDRESS: [address] [place] [address]   07/16/2018 -   Chief Complaint  Patient presents with   Follow-up    Pt started [facility] 8/11 and so far denies any complaints.    FU ILD - ccp and atypical p-anca trace positive May 2019. HP panel negative.  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 Lung function: Severity -FVC 63% and DLCO 58% showing moderate ILD. Rheum evaluation - 04/25/18 - no evidence of RA (Dr Trudie Reed). Dx of IPF given 06/04/18. STarted ofev 8/11/9    HPI Stephen [name] 70 y.o. - presents for follow-up of his idiopathic pulmonary fibrosis. Diagnoses given 06/04/2018. He is started on Ofev  06/09/2018. He is here with his wife. He says he is tolerating the Ofev just fine. No GI issues. He gets support from the Child psychotherapist Clarene Critchley. He will have a liver function test today. He deferred flu shot because he will have it with his primary care physician. He told me that he is planning to join the pulmonary fibrosis foundation patient support group. Next meeting is 07/25/2018 Thursday 6 PM at [facility]. He is also willing to join research trials.He has mobility issues on account of his obesity and cane use. He says he cannot do a treadmill or walk long distances.   He has chronic tinnitus and apparently this is from working in [employer] base during [place] War although he was never deployed overseas. He started getting VA eligibility. He asked [place] to fill forms. ROS - per HPI  OV 09/03/2018  Subjective:  Patient ID: Stephen [name] Salaam  Sammie Bench, male , DOB: [birth year] , age 20 y.o. , MRN: [id number] , ADDRESS: [address] [place] [address]   09/03/2018 -   Chief Complaint  Patient presents with   Follow-up    Doing well at this time coughing up white mucus     FU ILD - ccp and atypical p-anca trace positive May 2019. HP panel negative.  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 Lung function: Severity -FVC 63% and DLCO 58% showing moderate ILD. Rheum evaluation - 04/25/18 - no evidence of RA (Dr Trudie Reed). Dx of IPF given 06/04/18. STarted ofev 8/11/9   HPI Stephen [name] 59 y.o. -returns for follow-up of his IPF.  He has been on nintedanib since mid August 2019.  He is here with his wife.  So far is tolerating the nintedanib just fine.  He says that his baseline constipation still continues and he needs both MiraLAX and Citrucel.  However what he notices that when he has relief of constipation it is diarrhea.  He says he is not able to come down on the laxative dose because otherwise the constipation returns.  He feels the 05 is not causing any diarrhea.  Is no  abdominal pain nausea vomiting.  In terms of shortness of breath is stable.  He tried to have pulmonary function test as follow-up like a few weeks ago but he started coughing and did not produce enough spirometry's.  All we have the symptoms which is stable.  He is up-to-date with his flu shot.  He is interested in research protocols.  He uses a cane normally.      OV 12/17/2018  Subjective:  Patient ID: Stephen [name], male , DOB: 1950-11-27 , age 74 y.o. , MRN: [id number] , ADDRESS: [address] [place] [address]   12/17/2018 -   Chief Complaint  Patient presents with   Follow-up    PFT performed today but pt was unable to perform DLCO due to coughing. Pt states SOB is about the same. Denies any CP or chest tightness.    FU ILD - ccp and atypical p-anca- ALL trace positive May 2019. HP panel negative.  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 Lung function: Severity -FVC 63% and DLCO 58% showing moderate ILD. Rheum evaluation - 04/25/18 - no evidence of RA (Dr Trudie Reed). Dx of IPF given 06/04/18. STarted ofev 8/11/9   HPI Stephen [name] 86 y.o. -IPF follow-up.  He presents with his wife as usual.  He tells me that starting January 2020 nintedanib started causing significant diarrhea.  By the third week of January 2020 he reduced himself to 1 tablet once daily at 150 mg.  With this there is no diarrhea.  He says that at the full dose he would take Imodium for diarrhea and then he would get constipated for several days.  He would follow this with a laxative and then he would have explosive diarrhea.  He said the symptoms became unmanageable.  In terms of his IPF itself he is stable.  His other main issues worsening chronic cough.  He has had chronic cough for b for several years predating IPF diagnosis.  He has been on ACE inhibitor's but it appears that lisinopril got rotated to benazepril and then to losartan but the cough never improved.  So he got switch back to benazepril.  He says that his  primary care team is aware that all these agents can cause cough  but the emphasis was on controlling the blood pressure.  He says the cough is severe.  Mostly in the daytime very rarely at night.  There is a laryngeal hoarse quality to his cough.  Noticed results on fish oil and carvedilol.  We discussed about neuropathic treatment for cough but he is already on amitriptyline ROS - per HPI   OV 06/30/2019  Subjective:  Patient ID: Stephen [name], male , DOB: 05-01-1951 , age 46 y.o. , MRN: [id number] , ADDRESS: [address] [place] [address]   06/30/2019 -   Chief Complaint  Patient presents with   IPF (idiopathic pulmonary fibrosis)    Does not feel the breathing is any different than last visit. Still using Ofev, but still having diarrhea with it.     FU ILD - ccp and atypical p-anca- ALL trace positive May 2019. HP panel negative.  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 Lung function: Severity -FVC 63% and DLCO 58% showing moderate ILD. Rheum evaluation - 04/25/18 - no evidence of RA (Dr Trudie Reed). Dx of IPF given 06/04/18. STarted ofev 06/09/18  HPI Stephen [name] 15 y.o. -presents for follow-up with his wife.  He has a diagnosis of IPF.  He has been on nintedanib since August 2019.  He preferred to take nintedanib initially because of his preference to have diarrhea.  At this point in time he tells me that he is having progressive dyspnea.  Early in the spring 2020 he noticed that he could not walk back from his office to the front door.  He could do this in 2019.  He also has had difficulty mowing the lawn with the more.  In addition is having significant diarrhea from the nintedanib.  It is severe despite agents.  He is willing to change the drug.  In between he saw the nurse practitioner he had a high-resolution CT chest in August 2020.  He has mildly progressive disease even compared to March 2020.  Walking desaturation test and symptom scores show evidence of progression and documented  below.       OV 08/27/2019  Subjective:  Patient ID: Stephen [name], male , DOB: October 03, 1951 , age 11 y.o. , MRN: [id number] , ADDRESS: [address] [place] [address]  FU IPF  - ccp and atypical p-anca- ALL trace positive May 2019. HP panel negative.  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 Lung function: Severity -FVC 63% and DLCO 58% showing moderate ILD. Rheum evaluation - 04/25/18 - no evidence of RA (Dr Trudie Reed). Dx of IPF given 06/04/18. STarted ofev 8/11/9. STopped Aug 2020 due to progression and diarrhea. STrted Esbriet -July 25, 2019   08/27/2019 -   Chief Complaint  Patient presents with   Follow-up    Patient reports that his breathing is about the same. He has sob with exertion and productive cough.      HPI Stephen [name] 38 y.o. -presents with his wife for follow-up.  He tells me that Jacklynn Bue was started on July 25, 2019.  He took 3 weeks for it to come.  During this time he was off nintedanib and his diarrhea resolved. Wife tells me that he has been completely sedentary because of shortness of breath.  He feels the shortness of breath is getting worse.  He is also gained weight because of his sedentary lifestyle.  He is already morbidly obese.  He has pedal edema for which she wears stockings.  He is not using oxygen at night because he is never needed it.  He is very concerned about the shortness of breath getting worse.  In fact it is worse even since his last visit in August 2020.  This is documented in the questionnaire below.  Also seen in the pulmonary function test.  His CT scan of the chest August 2020 shows worsening within 5 months.  I reviewed this with him.  He tells me that even taking a shower makes him desaturate although today he did not desaturate here in with a forehead probe to 88%.  However he did not do a 6-minute walk test.  He feels he would benefit from oxygen   IMPRESSION: HRCT Augu 2020 compared to March 2020 1. Spectrum of findings  compatible with basilar predominant fibrotic interstitial lung disease with probable early honeycombing at the right costophrenic angle. Slight interval progression. Findings are consistent with UIP per consensus guidelines: Diagnosis of Idiopathic Pulmonary Fibrosis: An Official ATS/ERS/JRS/ALAT Clinical Practice Guideline. [place], Iss 5, [phone], Jun 30 2017. 2. Mild mediastinal lymphadenopathy is stable, compatible with benign reactive adenopathy. 3. Three-vessel coronary atherosclerosis.   Aortic Atherosclerosis (ICD10-I70.0).     Electronically Signed   By: Ilona Sorrel M.D.   On: 06/28/2019 16:35  ROS - per HPI   OV 09/29/2019  Subjective:  Patient ID: Stephen [name], male , DOB: [birth year] , age 35 y.o. , MRN: [id number] , ADDRESS: [address] [place] [address]   FU IPF  -trace positive ccp and atypical p-anca- ALL trace positive May 2019 (repeat CCP - October 2020]. HP panel negative.  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 Lung function: Severity -FVC 63% and DLCO 58% showing moderate ILD. Rheum evaluation - 04/25/18 - no evidence of RA (Dr Trudie Reed). Dx of IPF given 06/04/18. STarted ofev 8/11/9. STopped Aug 2020 due to progression and diarrhea. STrted Esbriet -July 25, 2019   Associated sleep apnea present   Last CT 06/27/2019 Low risk cardiac stress test April 2019.   09/29/2019 -   Chief Complaint  Patient presents with   Follow-up    Pt states his breathing has become worse since last visit and also has a worsening cough and is coughing up dark green phlegm which he has had for awhile now. Pt is on 2L O2 24/7.      HPI Stephen [name] 52 y.o. -he is now attending pulmonary rehabilitation.  On September 16, 2019 he did have a 6-minute walk test.  He did desaturate to 80% from 96%.  He dropped his pulse ox to 86% at the second minut.  He did correct with 2 L of oxygen.  E also in the interim he did have overnight oxygen study  and we have recommended 2 L nasal cannula oxygen with the CPAP.  He is now wearing that.  Of note, during the overnight oxygen study his heart rate was 48-50.  Today he also had a 6-minute walk test at our office on 2 L oxygen and with this his pulse ox did not drop.  Overall he is reporting stability.  This can be demonstrated by the symptom questionnaire below.  He does have significant amount of shortness of breath despite wearing his 2 L.  Even though the 2 L helps his dyspnea he still says the dyspnea is significant.  He still has chronic significant pedal edema.  He continues to be morbidly obese.  We discussed his diet.  His wife states that he eats all the time and cannot stop eating.  Apparently he goes through a carton of ice cream every few days.  He says that he will not be able to control his dietary indiscretion.  He is not interested in lung transplant.  He is enjoying pulmonary rehabilitation.  Given his diastolic dysfunction I did refer him to his cardiologist but he says he does not have an appointment.    02/13/2020 Follow up : ILD  Patient presents for a 94-monthfollow-up.  Patient says overall breathing is doing about the same.  He has had no flare of his cough or shortness of breath.  Says his activity level is about at baseline.  Says he would like to be more active has been try to do some yard work however is very hard for him to carry his oxygen tanks.  Would like a portable oxygen concentrator.  Walk test in the office shows desaturation below 88% on room air.  Requires 2 L of oxygen to keep O2 saturations greater than 88%.  Patient gets short of breath with heavy activity.  Has to stop and rest several times.  He remains on Esbriet.  Denies any nausea vomiting diarrhea.    OV 05/19/2020   Subjective:  Patient ID: RLevin [name] male , DOB: [birth year] age 70y.o. years. , MRN: [id number]  ADDRESS: [address] [place] [address]PC[place]  HRonita Hipps MD Providers : Treatment Team:   Attending Provider: RBrand Males MD  FU IPF  -trace positive ccp and atypical p-anca- ALL trace positive May 2019 (repeat CCP - October 2020]. HP panel negative.  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 Lung function: Severity -FVC 63% and DLCO 58% showing moderate ILD. Rheum evaluation - 04/25/18 - no evidence of RA (Dr HTrudie Reed. Dx of IPF given 06/04/18. STarted ofev 8/11/9. STopped Aug 2020 due to progression and diarrhea. STrted Esbriet -July 25, 2019.   Chief Complaint  Patient presents with   Follow-up    Productive cough with brownish-yellow phlegm       HPI RUZIEL COVAULT68y.o. -presents for follow-up.  Last seen in #2020 as standard of care.  After that he was on the GAurorastudy which prematurely ended.  He continues in his bed without any problems.  He has not lost any weight.  However he is having progressive shortness of breath and progressive cough.  Particularly worse in the last 1 month.  In the last few days the cough is even more worse.  Last night he was coughing all night.  He is not able to talk without coughing.  When he takes a shower with 2 L nasal cannula he desaturates to 84%.  When he walks in the mall with 2 L nasal cannula he desaturates to 84%.  He has been asked to uptitrate his oxygen to 4 L with exertion but is not done that.  He is also got some brown-yellow sputum for the last 1 month.  There is no wheezing.  Hycodan cough syrup is not helping.  He is frustrated by all this.  And so is his wife.  There is no edema chest pain.  He easily desaturates.    06/09/2020- Interim hx Patient contacted today for virtual telephone visit/2 to 3-week follow-up ILD. Cough may be a litle bit better but is still productive with yellow/brown mucus. Cough is not new. Hydromet medication does  help. He did not notice any difference after completing doxycycline/prednisone course. No increase in oxygen demand. Denies fever, chest tightness, wheezing.      OV  06/15/2020   Subjective:  Patient ID: Stephen [name], male , DOB: 17-May-1951, age 25 y.o. years. , MRN: [id number],  ADDRESS: [address] [place] [address] PCP  Ronita Hipps, MD Providers : Treatment Team:  Attending Provider: Brand Males, MD    FU IPF  -trace positive ccp and atypical p-anca- ALL trace positive May 2019 (repeat CCP - October 2020]. HP panel negative.  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 Lung function: Severity -FVC 63% and DLCO 58% showing moderate ILD. Rheum evaluation - 04/25/18 - no evidence of RA (Dr Trudie Reed). Dx of IPF given 06/04/18. STarted ofev 8/11/9. STopped Aug 2020 due to progression and diarrhea. STrted Esbriet -July 25, 2019 Gabapentin for cough July  2021  DNR since aug 2021    Chief Complaint  Patient presents with   Follow-up    Pt states that he is about the same since last visit. States still has problems with SOB.       HPI Stephen [name] 72 y.o. -presents for follow-up of his IPF.  He is tolerating pirfenidone after last visit in July 2021.  At that visit cough is the dominant feature.  We started him on gabapentin once daily at night.  He has tolerated it well.  He says the cough is improved somewhat but not significantly with it.  It is definitely helping him though.  Doxycycline and prednisone did not help him.  In fact the prednisone burst made him a little bit restless.  He is now requiring 3 L of oxygen at rest.  Previously it was 2 L.  His last CT scan of the chest was December 2020.  In the interim he saw a Designer, jewellery.  He is confirmed his DNR status.  He is really frustrated by the cough.  He feels that his sputum inside but unable to cough.  He is gagging and is trying to clear his throat a lot.  In addition his wife tells me last week for a few days he had bilateral leg weakness and had increased imbalance issues.  However it was not a focal neurologic deficit.  There is no bladder or bowel problems.  It has since  resolved.  There is no speech issues.  There is no back pain.  They are to talk to the primary care physician about this.      OV 08/24/2020  Subjective:  Patient ID: Stephen [name], male , DOB: 14-Jul-1951 , age 66 y.o. , MRN: [id number] , ADDRESS: [address] [place] [address] PCP Ronita Hipps, MD Patient Care Team: Ronita Hipps, MD as PCP - General (Family Medicine)  This Provider for this visit: Treatment Team:  Attending Provider: Brand Males, MD  Type of visit: Telephone/Video Circumstance: COVID-19 national emergency Identification of patient Stephen [name] with 08/19/1951 and MRN [id number] - 2 person identifier Risks: Risks, benefits, limitations of telephone visit explained. Patient understood and verbalized agreement to proceed Anyone else on call: yes - wife Patient location:his home This provider location: home due to extenutating circumstance   08/24/2020 - FU IPF   FU IPF  -trace positive ccp and atypical p-anca- ALL trace positive May 2019 (repeat CCP - October 2020]. HP panel negative.  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 Lung  function: Severity -FVC 63% and DLCO 58% showing moderate ILD. Rheum evaluation - 04/25/18 - no evidence of RA (Dr Trudie Reed). Dx of IPF given 06/04/18. STarted ofev 8/11/9. STopped Aug 2020 due to progression and diarrhea. STrted Esbriet -July 25, 2019 .Screen Failed Pliant study due to severe cough - Sept 2021  Severe cough   - Gabapentin for cough July  2021  - 3% saline - since Aug 2021  - hycodan qhs  - Unable to do PFT  DNR but full medical care since aug 2021  On 3LNC   HPI Stephen [name] 53 y.o. - telephone visit. Joined by wife. Says dyspnea stable on 3L [place] continuous and baseline. Says cough improved with rx of gabapentin, 3% saline and hycodan qhs. Wants refill on latter. Did not qualify for pliant study due to cough interfereing with PFT quality. Tolerating esbriet well. No new issues       - per  HPI  IMPRESSION: HRCT Sep 2021 1. Redemonstrated pattern of moderate pulmonary fibrosis in a pattern with apical to basal gradient featuring irregular peripheral interstitial opacity, traction bronchiectasis, bronchiolectasis, and areas of honeycombing at the lung bases. Fibrotic findings are not significantly changed in comparison to examination dated 10/30/2019, but are clearly worsened over time on multiple prior examinations dating back to 02/01/2014. 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 2. Cardiomegaly and coronary artery disease. 3. Aortic Atherosclerosis (ICD10-I70.0).     Electronically Signed   By: Eddie Candle M.D.   On: 07/07/2020 09:20     OV 11/17/2020  Subjective:  Patient ID: Stephen [name], male , DOB: 03-22-1951 , age 61 y.o. , MRN: [id number] , ADDRESS: [address] [place] [address] PCP Ronita Hipps, MD Patient Care Team: Ronita Hipps, MD as PCP - General (Family Medicine)  This Provider for this visit: Treatment Team:  Attending Provider: Brand Males, MD    11/17/2020 -   Chief Complaint  Patient presents with   Follow-up    Pt states he has been doing okay since last visit. States breathing may be a little worse since last visit. Pt was diagnosed with Covid 10/28/20 but just had mild symptoms.     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 HP panel negative.  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 Lung function: Severity -FVC 63% and DLCO 58% showing moderate ILD. Rheum evaluation - 04/25/18 - no evidence of RA (Dr Trudie Reed). Dx of IPF given 06/04/18. STarted ofev 8/11/9. STopped Aug 2020 due to progression and diarrhea. STrted Esbriet -July 25, 2019 .Screen Failed Pliant study due to severe cough - Sept 2021  Severe cough   - Gabapentin for cough July  2021  - 3%  saline - since Aug 2021  - hycodan qhs  - Unable to do PFT  DNR but full medical care since aug 2021  On 3LNC    HPI Stephen [name] 49 y.o. -presents with his wife.  He had a good holiday but on October 28, 2020 he developed omicron COVID.  He was barely symptomatic.  Wife had some sinus congestion.  He did not deteriorate.  He is doing well now.  This is following an exposure.  He continues his Esbriet.  He continues 3 L nasal cannula he feels stable.  His cough continues to be a problem.  He is not able to do 3% saline now because of backorder.  He is not doing Hycodan because it is too expensive but is willing for me to send another prescription.  Overall stable    CT Chest data  No results found.   OV 02/17/2021  Subjective:  Patient ID: Stephen [name], male , DOB: 01/04/1951 , age 13 y.o. , MRN: [id number] , ADDRESS: [address] [place] [address] PCP Ronita Hipps, MD Patient Care Team: Ronita Hipps, MD as PCP - General (Family Medicine)  This Provider for this visit: Treatment Team:  Attending Provider: Brand Males, MD    02/17/2021 -   Chief Complaint  Patient presents with   Follow-up    Reports increased cough that is sometimes productive with greyish sputum for past few months.        FU IPF  -trace positive ccp and atypical p-anca- ALL trace positive May 2019 (repeat CCP - October 2020]. HP panel negative.  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 Lung function: Severity -FVC 63% and DLCO 58% showing moderate ILD. Rheum evaluation - 04/25/18 - no evidence of RA (Dr Trudie Reed). Dx of IPF given 06/04/18. STarted ofev 8/11/9. STopped Aug 2020 due to progression and diarrhea. STrted Esbriet -July 25, 2019 .Screen Failed Pliant study due to severe cough - Sept 2021. Lst CT Sept 2021  Severe cough   - Gabapentin for cough July  2021  - 3% saline - since Aug 2021  - hycodan qhs  - Unable to do PFT  DNR but full medical care since aug 2021  Omicron  COVID -December 2021  On [facility]     HPI Stephen [name] 50 y.o. -returns for follow-up.  Since his last visit wife is reporting persistent cough.  He states he is coughing most of the day.  At night when he sleeps he does not cough but when he goes to the bathroom comes back he coughs quite a bit.  Guaifenesin syrup is not helping.  He used to be on Hycodan but this was on backorder.  3% saline was helping but this is also now on backorder and he is not able to use this.  He is frustrated by all this.  Uses 3 L of oxygen at night but on room air at rest he was fine.  His walking test and symptom scores are listed below.  Symptoms are gradually worsening over time.  While we walked him it also showed that he has a easier tendency to desaturate compared to the past.  He is on gabapentin for cough but is not helping.  He is on Esbriet and is tolerating this fine.  Last liver function test November 18, 2018 was normal.  Noted that he has lost some weight.  They think it is because of low appetite on pirfenidone but in the symptom questionnaire he did not report low appetite.  Nevertheless he reports to me that his appetite is lower PFT     IMPRESSION: 1. Redemonstrated pattern of moderate pulmonary fibrosis in a pattern with apical to basal gradient featuring irregular peripheral interstitial opacity, traction bronchiectasis, bronchiolectasis, and areas of honeycombing at the lung bases. Fibrotic findings are not significantly changed in comparison to examination dated 10/30/2019, but are clearly worsened over time on multiple prior examinations dating back to 02/01/2014. 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 2. Cardiomegaly and coronary artery disease. 3. Aortic Atherosclerosis (ICD10-I70.0).     Electronically Signed   By: Eddie Candle M.D.   On: 07/07/2020 09:20    OV 03/23/2021  Subjective:   Patient ID: Stephen [name], male , DOB: Dec 09, 1950 , age 37 y.o. , MRN: [id number] , ADDRESS: [address] [place] [address] PCP Ronita Hipps, MD Patient Care Team: Ronita Hipps, MD as PCP - General (Family Medicine)  This Provider for this visit: Treatment Team:  Attending Provider: Brand Males, MD    03/23/2021 -   Chief Complaint  Patient presents with   Follow-up    Pt is still having problems with the cough. States breathing is about the same      FU IPF  -trace positive ccp and atypical p-anca- ALL trace positive May 2019 (repeat CCP - October 2020]. HP panel negative.  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 Lung function: Severity -FVC 63% and DLCO 58% showing moderate ILD. Rheum evaluation - 04/25/18 - no evidence of RA (Dr Trudie Reed). Dx of IPF given 06/04/18. STarted ofev 8/11/9. STopped Aug 2020 due to progression and diarrhea. STrted Esbriet -July 25, 2019 .Screen Failed Pliant study due to severe cough - Sept 2021. Lst CT Sept 2021  Severe cough   - Gabapentin for cough July  2021  - 3% saline - since Aug 2021  - hycodan qhs -> backorder and codeine wih guaifeneisn -> also backorder -> ran out spring 2022  - Unable to do PFT  DNR but full medical care since aug 2021  [place] -December 2021  On [facility]   HPI Stephen [name] 66 y.o. -returns for follow-up.  At this point in time he is not able to get Hycodan or guaifenesin.  Because both are on backorder according to upstream.  In addition we tried to fill it.  For some reason I electronic medical record is printing paper on this.  It is not doing electronic routing to the pharmacy.  We do not know why.  We tried this multiple times over the course of the last few days.  Symptoms are stable.  Uses 3 L oxygen.  He is tolerating pirfenidone.  He is continues to lose weight because of low appetite.  We think this because of pirfenidone.  He is morbidly obese with a weight loss is healthy at this point.   However his cough is a chronic nagging problem.  This is despite gabapentin.  He does not want to do daily prednisone but after talking to him he was open to doing a few days of a prednisone burst.  We have tried to refill his 2 opiate prescriptions by paper.  His wife reports that she will take it to pharmacy and if there are problems she will let us know.  His most recent liver function was a few months ago.    OV 06/16/2021  Subjective:  Patient ID: Stephen [name], male , DOB: 1951-06-06 , age 30 y.o. , MRN: [id number] , ADDRESS: [address] [place] [address] PCP Ronita Hipps, MD Patient Care Team: Ronita Hipps, MD as PCP - General (Family Medicine)  This Provider for this visit: Treatment Team:  Attending Provider: Brand Males, MD    06/16/2021 -   Chief Complaint  Patient presents with   Follow-up    Pt states his breathing has become worse and also states that  the cough is worse.    FU IPF  -trace positive ccp and atypical p-anca- ALL trace positive May 2019 (repeat CCP - October 2020]. HP panel negative.  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 Lung function: Severity -FVC 63% and DLCO 58% showing moderate ILD. Rheum evaluation - 04/25/18 - no evidence of RA (Dr Trudie Reed). Dx of IPF given 06/04/18. STarted ofev 8/11/9. STopped Aug 2020 due to progression and diarrhea. STrted Esbriet -July 25, 2019 .Screen Failed Pliant study due to severe cough - Sept 2021. Lst CT Sept 2021  Severe cough   - Gabapentin for cough July  2021  - 3% saline - since Aug 2021  - hycodan qhs -> backorder and codeine wih guaifeneisn -> also backorder -> ran out spring 2022  - Unable to do PFT  DNR but full medical care since aug 2021  [place] -December 2021  On [facility]  HPI Stephen [name] 89 y.o. -returns for follow-up.  He presents with his wife.  He is significantly declined.  His symptom scores are worse.  Using 4 L of oxygen and desaturating easily.  On his visit to April 03, 2021 he went to the bathroom without oxygen and he lost consciousness and fell down.  He sustained bruising on his back.  Since then he seen a back specialist disc had epidural steroid injection for history.  He is scheduled for another 1.  He has become less mobile.  His cough is gotten worse.  He says 1 night he woke up just coughing.  He is got a laryngeal quality to his cough.  It has been a while since he saw ENT.  He is not able to talk or eat without coughing.  Most of the nights he is able to sleep without coughing but some nights he is gotten up.  This is despite Hycodan.  He took a short course of steroids because he was nervous about steroid effects and he handled it well.  But he cannot remember if the steroid helped him.  He is taking Delsym and gabapentin but all this is not helping.  He is tolerating his Esbriet well.  He is now on 1 big pill 3 times daily.  He is willing to see ENT again.       07/07/2021- interim hx   During his last visit with Dr. Chase Caller it was noted that patient has declined. Pulmonary fibrosis appears to be worsening, he is requiring more oxygen and easily desaturates. He is tolerating esbriet. Cough continues to be a problem despite gabapentin and opioid cough syrup. No further pulmonary function testing atleast til cough improved. Consider nebulized lidocaine and re-refer to ENT   Patient contacted today for 3 week follow-up. He reports having a reaction after taking prednisone that was prescribed by his PCP in August. He noted headache and elevated blood pressure reading after his first dose and stopped taking. He had an episode of hypoxemia while sitting on the side of his bed in the morning after a coughing fit and passed out which happened on August 27th. He is currently on 5L oxygen. They do not have sodium chloride nebulizer d/t backorder and ran out of gabapentin two weeks ago.   OV 07/28/2021  Subjective:  Patient ID: Stephen [name], male , DOB: April 16, 1951  , age 53 y.o. , MRN: [id number] , ADDRESS: [address] [place] [address] PCP Ronita Hipps, MD Patient Care Team: Ronita Hipps, MD  as PCP - General (Family Medicine)  This Provider for this visit: Treatment Team:  Attending Provider: Brand Males, MD    07/28/2021 -   Chief Complaint  Patient presents with   Follow-up    Follow up: breathing has not been good, patient on 5L of continuous O2    FU IPF  -trace positive ccp and atypical p-anca- ALL trace positive May 2019 (repeat CCP - October 2020]. HP panel negative.  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 Lung function: Severity -FVC 63% and DLCO 58% showing moderate ILD. Rheum evaluation - 04/25/18 - no evidence of RA (Dr Trudie Reed). Dx of IPF given 06/04/18. STarted ofev 8/11/9. STopped Aug 2020 due to progression and diarrhea. STrted Esbriet -July 25, 2019 .Screen Failed Pliant study due to severe cough - Sept 2021. Lst CT Sept 2021  Severe cough   - Gabapentin for cough July  2021  - 3% saline - since Aug 2021  - hycodan qhs -> backorder and codeine wih guaifeneisn -> also backorder -> ran out spring 2022  - Unable to do PFT  DNR but full medical care since aug 2021  [place] -December 2021  On [facility]  HPI BEE HAMMERSCHMIDT 70 y.o. -returns for follow-up.  I last saw him in August 2022.  After that he had a visit with nurse practitioner.  He continues to have relentless cough despite multiple interventions.  He has upcoming ENT appointment mid-October 2022.  In addition he is desaturating.  He has had 1 more syncopal episode at home just getting out of bed.  He does not cough at night but occasionally he will.  On 5 L nasal cannula just going to the bathroom drops his pulse ox to 60s.  Here in our office today 5L [place] 84% doing nothing on wheel chair, 6L [place] continous 93%.  Wife says he is quieter than usual.  He is eating less.  Is less mobile.  It is becoming more difficult to take care of him.  Both of wife's parents  were in hospice care in the past because of cancer.  He is coughing significantly despite multiple interventions.  His quality of life is poor and is deteriorating fast.  He himself feels he cannot take a deep breath.  His symptom score shows deterioration below.   We did a walking desaturation test:   SYMPTOM SCALE - ILD 2/18/ 2020  06/30/2019 Stop ofev 08/27/2019 esbriet since July 25, 2019 09/29/2019 esbriet 06/15/2020 243# , esbriet 11/17/2020  02/17/2021 236#, esbriet 03/23/2021 233# = esbriet 06/16/2021 237# 07/07/2021  07/28/2021   O2 use RA ra ra  2 L 3L 3L 3L 3L 4L 5L 6l  Shortness of Breath 0 -> 5 scale with 5 being worst (score 6 If unable to do)            At rest _0 2.5 5  Simple tasks - showers, clothes change, eating, shaving _1 Walking level at own pace 1 1 x _2 Walking up Stairs _3 Total (40 - 48) Dyspnea Score _4 18.5 23  How bad is your cough? _5 How bad is your fatigue 3 3  _0 _1 0 0 0 0 0     PFT  PFT Results Latest Ref Rng & Units 08/27/2019 10/03/2018 08/16/2018 04/11/2018  FVC-Pre L 2.18 2.42 2.53 2.57  FVC-Predicted Pre % 56 65 67 63  Pre FEV1/FVC % % 82 91 89 89  FEV1-Pre L 1.78 2.22 2.26 2.29  FEV1-Predicted Pre % 62 80 81 76  DLCO uncorrected ml/min/mmHg 13.67 - - 16.68  DLCO UNC% % 59 - - 58  DLVA Predicted % 97 - - 106       has a past medical history of Abdominal aortic atherosclerosis ([place]) (03/16/2021), Abnormality of gait (01/21/2014), Allergy, Arthritis, ASVD (arteriosclerotic vascular disease) (02/06/2018), Benign paroxysmal positional vertigo (01/21/2014), Chronic respiratory failure with hypoxia ([place]) (02/13/2020), Dizziness (03/14/2017), GERD (gastroesophageal reflux disease), Heart murmur, Hiatal hernia, Hypercholesteremia (02/05/2018),  Hyperlipidemia, Hypertension, Idiopathic pulmonary fibrosis ([place]) (09/11/2018), Morbid obesity ([place]) (10/13/2020), Obesity (02/05/2018), Overweight (09/11/2018), Pain in joint of right hip (03/15/2021), Post-traumatic headache (01/21/2014), Research study patient (11/21/2019), Sleep apnea, and Vertigo.   reports that he has never smoked. He has never used smokeless tobacco.  Past Surgical History:  Procedure Laterality Date   CARDIAC CATHETERIZATION  03/22/2001   LEG SURGERY Bilateral 04/2001    Allergies  Allergen Reactions   Prednisone Other (See Comments)    Patient reports that he gets a headache and high blood pressure.    Ciprofloxacin Other (See Comments)    colitis   Codeine Other (See Comments)    Other reaction(s): Hallucinations    Immunization History  Administered Date(s) Administered   Fluad Quad(high Dose 65+) 06/30/2019   Influenza, High Dose Seasonal PF 08/28/2017, 07/19/2018, 07/22/2020   PFIZER(Purple Top)SARS-COV-2 Vaccination 11/16/2019, 12/06/2019, 08/12/2020   Pneumococcal Conjugate-13 08/25/2016   Pneumococcal Polysaccharide-23 11/18/2007    Family History  Problem Relation Age of Onset   Dementia Mother    Heart attack Father    Aneurysm Father    Cancer Maternal Grandmother    Aneurysm Maternal Grandmother      Current Outpatient Medications:    amitriptyline (ELAVIL) 75 MG tablet, Take 75 mg by mouth at bedtime., Disp: , Rfl:    amLODipine (NORVASC) 10 MG tablet, Take 10 mg by mouth daily., Disp: , Rfl:    aspirin EC 81 MG tablet, Take 81 mg by mouth daily., Disp: , Rfl:    calcium carbonate (OSCAL) 1500 (600 Ca) MG TABS tablet, Take 1 tablet by mouth 2 (two) times daily with a meal., Disp: , Rfl:    calcium carbonate (TUMS - DOSED IN MG ELEMENTAL CALCIUM) 500 MG chewable tablet, Chew 1 tablet by mouth daily., Disp: , Rfl:    cholecalciferol (VITAMIN D) 1000 units tablet, Take 1,000 Units by mouth daily., Disp: , Rfl:    cloNIDine (CATAPRES) 0.3 MG  tablet, Take 0.3 mg by mouth daily after supper. , Disp: , Rfl:    dicyclomine (BENTYL) 10 MG capsule, Take 10 mg by mouth 3 (three) times daily before meals. , Disp: , Rfl:    guaiFENesin-codeine 100-10 MG/5ML syrup, Take 5 mLs by mouth 2 (two) times daily as needed., Disp: 120  mL, Rfl: 0   ketoconazole (NIZORAL) 2 % shampoo, Apply 1 application topically 2 (two) times a week., Disp: , Rfl:    lansoprazole (PREVACID) 30 MG capsule, Take 30 mg by mouth daily at 12 noon., Disp: , Rfl:    Methylcellulose, Laxative, (CITRUCEL PO), Take 1 Scoop by mouth daily., Disp: , Rfl:    metoprolol tartrate (LOPRESSOR) 50 MG tablet, Take 1 tablet by mouth 2 (two) times daily., Disp: , Rfl:    mometasone (NASONEX) 50 MCG/ACT nasal spray, Place 2 sprays into the nose as needed (allergies)., Disp: , Rfl:    montelukast (SINGULAIR) 10 MG tablet, Take 10 mg by mouth at bedtime., Disp: , Rfl:    Multiple Vitamin (MULTIVITAMIN WITH MINERALS) TABS tablet, Take 1 tablet by mouth daily., Disp: , Rfl:    nabumetone (RELAFEN) 750 MG tablet, Take 750 mg by mouth 2 (two) times daily., Disp: , Rfl:    olmesartan (BENICAR) 20 MG tablet, Take 1 tablet by mouth daily., Disp: , Rfl:    Pirfenidone (ESBRIET) 801 MG TABS, Take 801 mg by mouth 3 (three) times daily with meals., Disp: 90 tablet, Rfl: 5   polyethylene glycol (MIRALAX / GLYCOLAX) packet, Take 17 g by mouth daily., Disp: , Rfl:    Probiotic Product (PROBIOTIC DAILY PO), Take 1 capsule by mouth daily., Disp: , Rfl:    rosuvastatin (CRESTOR) 10 MG tablet, Take 10 mg by mouth daily., Disp: , Rfl:    spironolactone (ALDACTONE) 25 MG tablet, Take 25 mg by mouth 3 (three) times a week., Disp: , Rfl:    UNABLE TO FIND, Take 1 capsule by mouth 3 (three) times daily. Med Name: blood sugar harmony, Disp: , Rfl:    vitamin B-12 (CYANOCOBALAMIN) 1000 MCG tablet, Take 1,000 mcg by mouth daily., Disp: , Rfl:    vitamin E 400 UNIT capsule, Take 400 Units by mouth daily., Disp: , Rfl:     gabapentin (NEURONTIN) 300 MG capsule, Take 1 capsule (300 mg total) by mouth 3 (three) times daily. (Patient not taking: No sig reported), Disp: 90 capsule, Rfl: 5   sodium chloride HYPERTONIC 3 % nebulizer solution, Take by nebulization in the morning and at bedtime. (Patient not taking: Reported on 07/28/2021), Disp: 750 mL, Rfl: 12      Objective:   Vitals:   07/28/21 1134  BP: 140/78  Pulse: 84  Temp: 97.8 F (36.6 C)  SpO2: 93%  Weight: 226 lb 9.6 oz (102.8 kg)  Height: 5' 6" (1.676 m)    Estimated body mass index is 36.57 kg/m as calculated from the following:   Height as of this encounter: 5' 6" (1.676 m).   Weight as of this encounter: 226 lb 9.6 oz (102.8 kg).  _0 @  Filed Weights   07/28/21 1134  Weight: 226 lb 9.6 oz (102.8 kg)     Physical Exam Obese male who is pleasant.  Sitting in a wheelchair.  6 L oxygen on.  He has extensive crackles.  The crackles appear worse than several months ago.  No pedal edema.  Abdomen soft normal heart sounds.      Assessment:       ICD-10-CM   1. Chronic respiratory failure with hypoxia (HCC)  J96.11     2. IPF (idiopathic pulmonary fibrosis) ([place])  J84.112     3. Chronic cough  R05.3     4. Medication monitoring encounter  Z51.81     5. Personal history of COVID-19  Z86.16  6. Vaccine counseling  Z71.85     7. Flu vaccine need  Z23     8. Goals of care, counseling/discussion  Z71.89      Discussed medicare hospice benefit  - a medicare paid benefit  - for people with terminal qualifying  illness such as IPF, COPD, cancer with statistical prognosis < 6 months for which there is no cure - utilization of hospice shows people live longer paradoxically than those without hospice due to improved attention  - explained hospice locations - home, residential etc.,   - explained respite care options for caregivers  - explained that she could still get treatment for non-hospice diagnosis and still come  to office to see me for hospice related diagnosis that i provide support for  - explained that hospice provides nursing, MD, chaplain, volunteer and medications and supplies paid through medicare   -They have decided to accept hospice    Plan:     Patient Instructions     ICD-10-CM   1. Chronic respiratory failure with hypoxia (HCC)  J96.11     2. IPF (idiopathic pulmonary fibrosis) ([place])  J84.112     3. Chronic cough  R05.3     4. Medication monitoring encounter  Z51.81     5. Personal history of COVID-19  Z86.16     6. Vaccine counseling  Z71.85     7. Flu vaccine need  Z23      History covid in dec 2021   Pulmonary fibrosis  getiting worse aggressively - last year 3L [place], august 4L [place] and now sept 2022 6L [place] at rest. Home o2 set up does not go past 5L [place]  Tolerating esbriet well  Cough continues to be a problem  and destroying your quality of life despite gabapentin, 3% saline, delsym opioid cough syrup and montelukast. Unable to handle prednisone challenge August 2022  Deteriorating quality of life with class 4 dyspnea    Plan - refer home hospice (ask them to focus on his cough() - DME to change o2 set up to accommodtge for more o2  - 6l [place] at rest, 8L [place] with exertion - avoid tyvaso  - not functional enough - continue esbriet for IPF but hospice might ask you to stop  -Continue Delsym  - -Refill Hycodan and codeine/guaifenesisn - continue 3% saline neb as needed   - keep  ENT appt mid oct 2022 but if you are too weak can cancel - high dose flu shot 07/28/2021 - bivalent covid mRNA booster next week  Followup - 3 weeks telephone visit or video visit with app or DR Chase Caller -avoid face to face visit to extent possible   He needs intensive monitoring.  Needs extra layer of support from hospice. SIGNATURE    Dr. Brand Males, M.D., F.C.C.P,  Pulmonary and Critical Care Medicine Staff Physician, [place] Director - Interstitial Lung  Disease  Program  Pulmonary [place] [facility] [place], [place], [address]  Pager: [phone], If no answer or between  15:00h - 7:00h: call [phone] Telephone: [phone]  12:35 PM 07/28/2021

## 2021-07-28 NOTE — Patient Instructions (Addendum)
ICD-10-CM   1. Chronic respiratory failure with hypoxia (HCC)  J96.11     2. IPF (idiopathic pulmonary fibrosis) ([place])  J84.112     3. Chronic cough  R05.3     4. Medication monitoring encounter  Z51.81     5. Personal history of COVID-19  Z86.16     6. Vaccine counseling  Z71.85     7. Flu vaccine need  Z23      History covid in dec 2021   Pulmonary fibrosis  getiting worse aggressively - last year 3L [place], august 4L [place] and now sept 2022 6L [place] at rest. Home o2 set up does not go past 5L [place]  Tolerating esbriet well  Cough continues to be a problem  and destroying your quality of life despite gabapentin, 3% saline, delsym opioid cough syrup and montelukast. Unable to handle prednisone challenge August 2022  Deteriorating quality of life with class 4 dyspnea    Plan - refer home hospice (ask them to focus on his cough() - DME to change o2 set up to accommodtge for more o2  - 6l [place] at rest, 8L [place] with exertion - avoid tyvaso  - not functional enough - continue esbriet for IPF but hospice might ask you to stop  -Continue Delsym  - -Refill Hycodan and codeine/guaifenesisn - continue 3% saline neb as needed   - keep  ENT appt mid oct 2022 but if you are too weak can cancel - high dose flu shot 07/28/2021 - bivalent covid mRNA booster next week  Followup - 3 weeks telephone visit or video visit with app or DR Chase Caller -avoid face to face visit to extent possible

## 2021-07-28 NOTE — Progress Notes (Signed)
Called and spoke with pt's spouse letting her know the info I found out from DME that [facility] will not be able to go up to 6L continuous that we will need to send order for them to be able to receive more tanks to use when leave house. Stephen [name] verbalized understanding. Order has been placed. Nothing further needed.

## 2021-07-28 NOTE — Addendum Note (Signed)
Addended by: Lorretta Harp on: 07/28/2021 01:26 PM   Modules accepted: Orders

## 2021-07-29 DIAGNOSIS — I10 Essential (primary) hypertension: Secondary | ICD-10-CM | POA: Diagnosis not present

## 2021-07-29 DIAGNOSIS — E785 Hyperlipidemia, unspecified: Secondary | ICD-10-CM | POA: Diagnosis not present

## 2021-07-29 MED ORDER — GUAIFENESIN-CODEINE 100-10 MG/5ML PO SOLN: 5.0000 mL | Freq: Two times a day (BID) | ORAL | 0 refills | Status: AC | PRN

## 2021-08-22 ENCOUNTER — Other Ambulatory Visit: Payer: Self-pay

## 2021-08-22 ENCOUNTER — Telehealth: Payer: Self-pay | Admitting: *Deleted

## 2021-08-22 ENCOUNTER — Encounter: Payer: Self-pay | Admitting: Adult Health

## 2021-08-22 ENCOUNTER — Telehealth (INDEPENDENT_AMBULATORY_CARE_PROVIDER_SITE_OTHER): Payer: Medicare Other | Admitting: Adult Health

## 2021-08-22 DIAGNOSIS — J9611 Chronic respiratory failure with hypoxia: Secondary | ICD-10-CM | POA: Diagnosis not present

## 2021-08-22 DIAGNOSIS — J84112 Idiopathic pulmonary fibrosis: Secondary | ICD-10-CM

## 2021-08-22 DIAGNOSIS — R053 Chronic cough: Secondary | ICD-10-CM | POA: Diagnosis not present

## 2021-08-22 NOTE — Progress Notes (Signed)
Virtual Visit via Video Note  I connected with Stephen [name] on 08/22/21 at  2:00 PM EDT by a video enabled telemedicine application and verified that I am speaking with the correct person using two identifiers.  Location: Patient: Home  Provider: Office    I discussed the limitations of evaluation and management by telemedicine and the availability of in person appointments. The patient expressed understanding and agreed to proceed.  History of Present Illness: 70 yo male never smoker followed for IPF and Chronic Respiratory Failure   Today's video visit is for a 1 month follow-up.  Patient has progressive IPF with clinical worsening over the last several months.  He is maintained on oxygen with increased oxygen demands over the last several months.  Patient last visit was referred to hospice.  Patient has now stopped Esbriet.  He is now requiring 6 L of oxygen at rest and 8 L of oxygen and with activity.  Patient is now using morphine 0.5 mg every 4 hours as needed.  Occasionally takes Ativan or Haldol.  This seems to be really helping with his work of breathing.  Says that he does not feel like he struggling for every breath.  Also his cough has substantially improved he was having severe coughing paroxysms since starting this regimen his cough has improved greatly.  Patient currently does not have any nebulizer medicines had been recommended on 3% saline nebs but has been unable to get these through his pharmacy.  He has received his COVID booster.  We did have some technical difficulties.  Patient was on video visit but video camera was not working properly.  We used his video and phone to help complete rest of visit.     Observations/Objective: FU IPF  -trace positive ccp and atypical p-anca- ALL trace positive May 2019 (repeat CCP - October 2020]. HP panel negative.  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 Lung function: Severity -FVC 63% and DLCO 58% showing moderate ILD. Rheum  evaluation - 04/25/18 - no evidence of RA (Dr Trudie Reed). Dx of IPF given 06/04/18. STarted ofev 8/11/9. STopped Aug 2020 due to progression and diarrhea. STrted Esbriet -July 25, 2019 .Screen Failed [place] study due to severe cough - Sept 2021. Lst CT Sept 2021   Severe cough   - Gabapentin for cough July  2021  - 3% saline - since Aug 2021  - hycodan qhs -> backorder and codeine wih guaifeneisn -> also backorder -> ran out spring 2022  - Unable to do PFT   DNR but full medical care since aug 2021   [place] -December 2021  [place] stoppped 06/2021  Hospice referral 06/2021    Past Medical History:  Diagnosis Date   Abdominal aortic atherosclerosis ([place]) 03/16/2021   Abnormality of gait 01/21/2014   Allergy    Arthritis    ASVD (arteriosclerotic vascular disease) 02/06/2018   Benign paroxysmal positional vertigo 01/21/2014   Chronic respiratory failure with hypoxia ([place]) 02/13/2020   Dizziness 03/14/2017   GERD (gastroesophageal reflux disease)    Heart murmur    Hiatal hernia    Hypercholesteremia 02/05/2018   Hyperlipidemia    Hypertension    Idiopathic pulmonary fibrosis ([place]) 09/11/2018   12/31/2018-CT chest high-res- interval progression of basilar predominant fibrotic interstitial lung disease without frank honeycombing, categorized as probable UIP  10/03/2018-pulmonary function test-spirometry-FVC 2.42 (65% predicted), ratio 91, FEV1 2.22 (80% predicted) >>>Patient attempted to complete DLCO today was unable to complete DLCO due to  severe coughing  03/05/2018- [facility] inter   Morbid obesity ([place]) 10/13/2020   Obesity 02/05/2018   Overweight 09/11/2018   Pain in joint of right hip 03/15/2021   Post-traumatic headache 01/21/2014   Research study patient 11/21/2019   Sleep apnea    Vertigo    Current Outpatient Medications on File Prior to Visit  Medication Sig Dispense Refill   amitriptyline (ELAVIL) 75 MG tablet Take 75 mg by mouth at bedtime.     amLODipine (NORVASC)  10 MG tablet Take 10 mg by mouth daily.     aspirin EC 81 MG tablet Take 81 mg by mouth daily.     calcium carbonate (OSCAL) 1500 (600 Ca) MG TABS tablet Take 1 tablet by mouth 2 (two) times daily with a meal.     calcium carbonate (TUMS - DOSED IN MG ELEMENTAL CALCIUM) 500 MG chewable tablet Chew 1 tablet by mouth daily.     cholecalciferol (VITAMIN D) 1000 units tablet Take 1,000 Units by mouth daily.     cloNIDine (CATAPRES) 0.3 MG tablet Take 0.3 mg by mouth daily after supper.      dicyclomine (BENTYL) 10 MG capsule Take 10 mg by mouth 3 (three) times daily before meals.      gabapentin (NEURONTIN) 300 MG capsule Take 1 capsule (300 mg total) by mouth 3 (three) times daily. (Patient not taking: No sig reported) 90 capsule 5   guaiFENesin-codeine 100-10 MG/5ML syrup Take 5 mLs by mouth 2 (two) times daily as needed. 120 mL 0   ketoconazole (NIZORAL) 2 % shampoo Apply 1 application topically 2 (two) times a week.     lansoprazole (PREVACID) 30 MG capsule Take 30 mg by mouth daily at 12 noon.     Methylcellulose, Laxative, (CITRUCEL PO) Take 1 Scoop by mouth daily.     metoprolol tartrate (LOPRESSOR) 50 MG tablet Take 1 tablet by mouth 2 (two) times daily.     mometasone (NASONEX) 50 MCG/ACT nasal spray Place 2 sprays into the nose as needed (allergies).     montelukast (SINGULAIR) 10 MG tablet Take 10 mg by mouth at bedtime.     Multiple Vitamin (MULTIVITAMIN WITH MINERALS) TABS tablet Take 1 tablet by mouth daily.     nabumetone (RELAFEN) 750 MG tablet Take 750 mg by mouth 2 (two) times daily.     olmesartan (BENICAR) 20 MG tablet Take 1 tablet by mouth daily.     Pirfenidone (ESBRIET) 801 MG TABS Take 801 mg by mouth 3 (three) times daily with meals. 90 tablet 5   polyethylene glycol (MIRALAX / GLYCOLAX) packet Take 17 g by mouth daily.     Probiotic Product (PROBIOTIC DAILY PO) Take 1 capsule by mouth daily.     rosuvastatin (CRESTOR) 10 MG tablet Take 10 mg by mouth daily.     sodium  chloride HYPERTONIC 3 % nebulizer solution Take by nebulization in the morning and at bedtime. (Patient not taking: Reported on 07/28/2021) 750 mL 12   spironolactone (ALDACTONE) 25 MG tablet Take 25 mg by mouth 3 (three) times a week.     UNABLE TO FIND Take 1 capsule by mouth 3 (three) times daily. Med Name: blood sugar harmony     vitamin B-12 (CYANOCOBALAMIN) 1000 MCG tablet Take 1,000 mcg by mouth daily.     vitamin E 400 UNIT capsule Take 400 Units by mouth daily.     No current facility-administered medications on file prior to visit.     Assessment and  Plan: Progressive IPF-patient has been clinically deteriorating over the last several months.  He is now under hospice care at home.  If patient says his quality of life has improved on his current regimen.  He is not struggling for every breath.  Feels more comfortable.  Patient is continue on current regimen  Chronic respiratory failure.  Over the last several months has had increased oxygen demands now requiring 6 L of oxygen at rest and 8 L with activity.  Patient says his shortness of breath has improved since beginning morphine.  Chronic cough-improved on current regimen  Plan Patient Instructions  Continue on oxygen 6 L at rest and 8 L with activity to maintain O2 saturations greater than 88 to 90% Mucinex DM twice daily as needed for cough or congestion May use albuterol nebulizer every 6 hours as needed Activity as tolerated Continue with home hospice. Follow-up with Dr. Chase Caller in 3 months and as needed    Follow Up Instructions:    I discussed the assessment and treatment plan with the patient. The patient was provided an opportunity to ask questions and all were answered. The patient agreed with the plan and demonstrated an understanding of the instructions.   The patient was advised to call back or seek an in-person evaluation if the symptoms worsen or if the condition fails to improve as anticipated.  I provided  22  minutes of non-face-to-face time during this encounter.   Rexene Edison, NP

## 2021-08-22 NOTE — Telephone Encounter (Signed)
ATC patient to change televisit to video visit.  LVM to return call to see if he have to ability to do a video visit instead of a televisit as Tammy prefers video visits.  When he returns call, please change to video visit if able to do through [facility] or on a smart phone.  Will await return call.

## 2021-08-22 NOTE — Patient Instructions (Signed)
Continue on oxygen 6 L at rest and 8 L with activity to maintain O2 saturations greater than 88 to 90% Mucinex DM twice daily as needed for cough or congestion May use albuterol nebulizer every 6 hours as needed Activity as tolerated Continue with home hospice. Follow-up with Dr. Chase Caller in 3 months and as needed

## 2021-12-08 ENCOUNTER — Telehealth: Admitting: Internal Medicine

## 2021-12-08 DIAGNOSIS — J9611 Chronic respiratory failure with hypoxia: Secondary | ICD-10-CM | POA: Diagnosis not present

## 2021-12-08 DIAGNOSIS — R053 Chronic cough: Secondary | ICD-10-CM | POA: Diagnosis not present

## 2021-12-08 DIAGNOSIS — Z515 Encounter for palliative care: Secondary | ICD-10-CM | POA: Diagnosis not present

## 2021-12-08 DIAGNOSIS — Z7189 Other specified counseling: Secondary | ICD-10-CM | POA: Diagnosis not present

## 2021-12-08 DIAGNOSIS — Z66 Do not resuscitate: Secondary | ICD-10-CM | POA: Diagnosis not present

## 2021-12-08 DIAGNOSIS — J84112 Idiopathic pulmonary fibrosis: Secondary | ICD-10-CM

## 2021-12-08 NOTE — Progress Notes (Signed)
Type of visit: Telephone/Video Circumstance: COVID-19 national emergency Identification of patient Stephen [name] with [birth year] and MRN [id number] - 2 person identifier Risks: Risks, benefits, limitations of telephone visit explained. Patient understood and verbalized agreement to proceed Anyone else on call: wife Patient location: his home This provider location: [address]; [place]; [place] [address]. [facility]. [phone]  Xxxxxxxxxxxxxxxxxxxxxxx  S: This is a video visit for Stephen [name] patient.  He has IPF.  Few months ago he went on hospice care.  His wife also joined him on this video visit.  He has deteriorated.  He is now on 10 L nasal cannula all the time.  When he stands up his pulse ox will go into the 70s or 60s.  He is on a chair to chair existence.  He is on a chair to bed existence.  He does not even go to the bathroom by himself.  He is sedentary and he watches laptop TV.  He is now a DNR.  He has a MOST form filled.  He is on hospice care.  They are appreciative of the hospice services.  He has 2 kids and 2 great grandkids and 5 grandkids.  They do visit with him.  He says he is content with his life.  His cough used to be a major problem but it is now improved.  I think it is because of the morphine.  He is not on pirfenidone anymore.  He is not on any antifibrotic's.  Objective -Obese on oxygen.  Smiling and cheerful.  Able to talk normally.   Assessment    ICD-10-CM   1. Chronic respiratory failure with hypoxia (HCC)  J96.11     2. IPF (idiopathic pulmonary fibrosis) ([place])  J84.112     3. Chronic cough  R05.3     4. DNR (do not resuscitate)  Z66     5. Goals of care, counseling/discussion  Z71.89     6. Hospice care patient  Z51.5       Plan  -Continue DNR and hospice care and oxygen. -Prognosis is still guarded. -Continue opioids for symptom relief.    SIGNATURE    Dr. Brand Males, M.D., F.C.C.P,  Pulmonary  and Critical Care Medicine Staff Physician, [facility] Director - Interstitial Lung Disease  Program  Pulmonary [place] at [place], [place], [address]  NPI Number:  NPI #[id number] [facility] Number: [id number]  Pager: [phone], If no answer  -> Check AMION or Try [phone] Telephone (clinical office): [phone] Telephone (research): [phone]  4:13 PM 12/08/2021

## 2021-12-08 NOTE — Patient Instructions (Signed)
ICD-10-CM   1. Chronic respiratory failure with hypoxia (HCC)  J96.11     2. IPF (idiopathic pulmonary fibrosis) ([place])  J84.112     3. Chronic cough  R05.3     4. DNR (do not resuscitate)  Z66     5. Goals of care, counseling/discussion  Z71.89     6. Hospice care patient  Z51.5       Noted that you are on 10 L of oxygen ECOG 4 Cough improved  Plan  - continue hospic care  Followoup  - video visit I 6-8 weeks with Dr Chase Caller

## 2022-01-10 ENCOUNTER — Telehealth: Payer: Self-pay | Admitting: Internal Medicine

## 2022-01-10 NOTE — Telephone Encounter (Signed)
Stephen [name]  - pls ask wife address to mail condolence card and also get a card to sign ? ?Thanks ? ? ? ?SIGNATURE  ? ? ?Dr. Brand Males, M.D., F.C.C.P,  ?Pulmonary and Critical Care Medicine ?Staff Physician, [place] ?Center Director - Interstitial Lung Disease  Program  ?Pulmonary [facility] ?[place], [place], [address] ? ?NPI Number:  NPI #[id number] ?DEA Number: [id number] ? ?Pager: [phone], If no answer  -> Check AMION or Try [phone] ?Telephone (clinical office): [phone] ?Telephone (research): [phone] ? ?2:53 PM ?01/10/2022 ? ?

## 2022-01-10 NOTE — Telephone Encounter (Signed)
Tried to call pt's spouse but was unable to reach. Did express my condolences in VM. Will obtain condolence card. ?

## 2022-01-19 ENCOUNTER — Telehealth: Payer: Self-pay | Admitting: Internal Medicine

## 2022-01-19 NOTE — Telephone Encounter (Signed)
Called and spoke to wife Stephen [name]. Expressed condolences ? ? ? ? ?SIGNATURE  ? ? ?Dr. Brand Males, M.D., F.C.C.P,  ?Pulmonary and Critical Care Medicine ?Staff Physician, [place] [facility] ?Center Director - Interstitial Lung Disease  Program  ?Pulmonary [facility] ?[place], [place], [address] ? ?NPI Number:  NPI #[id number] ?DEA Number: [id number] ? ?Pager: [phone], If no answer  -> Check AMION or Try [phone] ?Telephone (clinical office): [phone] ?Telephone (research): [phone] ? ?3:26 PM ?01/19/2022 ? ?

## 2022-01-28 DEATH — deceased
